# Patient Record
Sex: Female | Born: 1990 | Race: Black or African American | Hispanic: No | Marital: Single | Smoking: Former smoker
Health system: Southern US, Community
[De-identification: ages and names within clinical notes are randomized; demographics above are authoritative.]

## PROBLEM LIST (undated history)

## (undated) ENCOUNTER — Inpatient Hospital Stay: Payer: Self-pay

## (undated) ENCOUNTER — Inpatient Hospital Stay (HOSPITAL_COMMUNITY): Payer: Self-pay

## (undated) DIAGNOSIS — Z889 Allergy status to unspecified drugs, medicaments and biological substances status: Secondary | ICD-10-CM

## (undated) DIAGNOSIS — R202 Paresthesia of skin: Secondary | ICD-10-CM

## (undated) DIAGNOSIS — J45909 Unspecified asthma, uncomplicated: Secondary | ICD-10-CM

## (undated) DIAGNOSIS — R569 Unspecified convulsions: Secondary | ICD-10-CM

## (undated) DIAGNOSIS — R5383 Other fatigue: Secondary | ICD-10-CM

## (undated) DIAGNOSIS — F329 Major depressive disorder, single episode, unspecified: Secondary | ICD-10-CM

## (undated) DIAGNOSIS — F988 Other specified behavioral and emotional disorders with onset usually occurring in childhood and adolescence: Secondary | ICD-10-CM

## (undated) DIAGNOSIS — F419 Anxiety disorder, unspecified: Secondary | ICD-10-CM

## (undated) DIAGNOSIS — R200 Anesthesia of skin: Secondary | ICD-10-CM

## (undated) DIAGNOSIS — K219 Gastro-esophageal reflux disease without esophagitis: Secondary | ICD-10-CM

## (undated) DIAGNOSIS — R519 Headache, unspecified: Secondary | ICD-10-CM

## (undated) DIAGNOSIS — F32A Depression, unspecified: Secondary | ICD-10-CM

## (undated) DIAGNOSIS — J302 Other seasonal allergic rhinitis: Secondary | ICD-10-CM

## (undated) DIAGNOSIS — G8929 Other chronic pain: Secondary | ICD-10-CM

## (undated) HISTORY — DX: Other chronic pain: G89.29

## (undated) HISTORY — PX: TUBAL LIGATION: SHX77

## (undated) HISTORY — DX: Anesthesia of skin: R20.2

## (undated) HISTORY — PX: CHOLECYSTECTOMY: SHX55

## (undated) HISTORY — DX: Other specified behavioral and emotional disorders with onset usually occurring in childhood and adolescence: F98.8

## (undated) HISTORY — DX: Anxiety disorder, unspecified: F41.9

## (undated) HISTORY — PX: TONSILLECTOMY: SUR1361

## (undated) HISTORY — DX: Other fatigue: R53.83

## (undated) HISTORY — DX: Anesthesia of skin: R20.0

---
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
## 2009-11-11 ENCOUNTER — Emergency Department: Payer: Self-pay | Admitting: Emergency Medicine

## 2010-10-31 ENCOUNTER — Emergency Department: Payer: Self-pay | Admitting: Internal Medicine

## 2011-01-12 ENCOUNTER — Emergency Department: Payer: Self-pay | Admitting: Emergency Medicine

## 2011-02-26 ENCOUNTER — Encounter: Payer: Self-pay | Admitting: Sports Medicine

## 2011-02-28 ENCOUNTER — Encounter: Payer: Self-pay | Admitting: Sports Medicine

## 2011-03-30 ENCOUNTER — Encounter: Payer: Self-pay | Admitting: Sports Medicine

## 2011-09-15 ENCOUNTER — Emergency Department: Payer: Self-pay | Admitting: *Deleted

## 2011-11-05 ENCOUNTER — Other Ambulatory Visit: Payer: Self-pay | Admitting: Physician Assistant

## 2011-11-05 LAB — CBC WITH DIFFERENTIAL/PLATELET
Basophil %: 0.7 %
Eosinophil #: 0.1 10*3/uL (ref 0.0–0.7)
HCT: 39.8 % (ref 35.0–47.0)
HGB: 13.6 g/dL (ref 12.0–16.0)
Lymphocyte #: 2.2 10*3/uL (ref 1.0–3.6)
Lymphocyte %: 25.5 %
MCV: 90 fL (ref 80–100)
Neutrophil #: 5.7 10*3/uL (ref 1.4–6.5)
RBC: 4.42 10*6/uL (ref 3.80–5.20)
RDW: 13 % (ref 11.5–14.5)
WBC: 8.5 10*3/uL (ref 3.6–11.0)

## 2011-11-05 LAB — COMPREHENSIVE METABOLIC PANEL
Albumin: 3.7 g/dL (ref 3.4–5.0)
Anion Gap: 5 — ABNORMAL LOW (ref 7–16)
Calcium, Total: 8.6 mg/dL (ref 8.5–10.1)
Chloride: 104 mmol/L (ref 98–107)
Glucose: 91 mg/dL (ref 65–99)
Osmolality: 274 (ref 275–301)
Potassium: 3.8 mmol/L (ref 3.5–5.1)
Sodium: 137 mmol/L (ref 136–145)
Total Protein: 7.9 g/dL (ref 6.4–8.2)

## 2011-11-05 LAB — TSH: Thyroid Stimulating Horm: 2.53 u[IU]/mL

## 2011-11-05 LAB — HCG, QUANTITATIVE, PREGNANCY: Beta Hcg, Quant.: <1 m[IU]/mL — ABNORMAL LOW

## 2012-01-24 ENCOUNTER — Encounter (HOSPITAL_COMMUNITY): Payer: Self-pay

## 2012-01-24 ENCOUNTER — Inpatient Hospital Stay (HOSPITAL_COMMUNITY)
Admission: AD | Admit: 2012-01-24 | Discharge: 2012-01-24 | Disposition: A | Discharge status: Home / Self Care | Payer: PRIVATE HEALTH INSURANCE | Source: Ambulatory Visit | Attending: Obstetrics and Gynecology | Admitting: Obstetrics and Gynecology

## 2012-01-24 DIAGNOSIS — O99891 Other specified diseases and conditions complicating pregnancy: Secondary | ICD-10-CM | POA: Insufficient documentation

## 2012-01-24 DIAGNOSIS — J069 Acute upper respiratory infection, unspecified: Secondary | ICD-10-CM

## 2012-01-24 DIAGNOSIS — O210 Mild hyperemesis gravidarum: Secondary | ICD-10-CM | POA: Insufficient documentation

## 2012-01-24 HISTORY — DX: Other seasonal allergic rhinitis: J30.2

## 2012-01-24 HISTORY — DX: Unspecified asthma, uncomplicated: J45.909

## 2012-01-24 LAB — URINALYSIS, ROUTINE W REFLEX MICROSCOPIC
Glucose, UA: NEGATIVE mg/dL
Ketones, ur: NEGATIVE mg/dL
Leukocytes, UA: NEGATIVE
Protein, ur: NEGATIVE mg/dL

## 2012-01-24 MED ORDER — HYDROCOD POLST-CHLORPHEN POLST 10-8 MG/5ML PO LQCR: 5.0000 mL | Freq: Two times a day (BID) | ORAL | Status: DC | PRN

## 2012-01-24 MED ORDER — PROMETHAZINE HCL 25 MG/ML IJ SOLN
25.0000 mg | Freq: Once | INTRAVENOUS | Status: DC
  Filled 2012-01-24: qty 1

## 2012-01-24 MED ORDER — HYDROCOD POLST-CHLORPHEN POLST 10-8 MG/5ML PO LQCR
5.0000 mL | Freq: Once | ORAL | Status: AC
  Administered 2012-01-24: 5 mL via ORAL
  Filled 2012-01-24: qty 5

## 2012-01-24 NOTE — MAU Note (Signed)
Patient states she has had a cold and cough for about one week. With coughing she vomits. Is taking a Z pack for the URI. Has been dehydrated and unable to keep much food or fluid down. General body aches and upper abdominal pain. Denies any bleeding or vaginal discharge.

## 2012-01-24 NOTE — MAU Provider Note (Signed)
History     CSN: [id number]  Arrival date and time: 01/24/12 1043   First Provider Initiated Contact with Patient 01/24/12 1205      Chief Complaint  Patient presents with  . Emesis  . Cough   HPI This is a 21 y.o. female at [redacted]w[redacted]d who presents with c/o vomiting when she coughs.  She has had a cold for a week and was given a Z-pack for it. States it is not so much that she cannot keep food down, but that she does not feel like eating because of coughing. Denies fever.Denies pain or bleeding.   RN Note: Patient states she has had a cold and cough for about one week. With coughing she vomits. Is taking a Z pack for the URI. Has been dehydrated and unable to keep much food or fluid down. General body aches and upper abdominal pain. Denies any bleeding or vaginal discharge.       OB History    Grav Para Term Preterm Abortions TAB SAB Ect Mult Living   1 0 0 0 0 0 0 0 0 0       Past Medical History  Diagnosis Date  . Asthma   . Seasonal allergies     Past Surgical History  Procedure Date  . Tonsillectomy     Family History  Problem Relation Age of Onset  . Hypertension Mother   . Arthritis Mother   . Cancer Mother   . Diabetes Mother   . Cancer Father     History  Substance Use Topics  . Smoking status: Never Smoker   . Smokeless tobacco: Not on file  . Alcohol Use: No    Allergies:  Allergies  Allergen Reactions  . Sulfa Drugs Cross Reactors Other (See Comments)    Uncontrollable body shaking.    Prescriptions prior to admission  Medication Sig Dispense Refill  . guaiFENesin-dextromethorphan (ROBITUSSIN DM) 100-10 MG/5ML syrup Take 5 mLs by mouth 3 (three) times daily as needed. For cold symptoms.      . Prenatal Vit-Fe Fumarate-FA (PRENATAL MULTIVITAMIN) TABS Take 1 tablet by mouth daily.        ROS See HPI  Physical Exam   Blood pressure 122/71, pulse 102, temperature 98.3 F (36.8 C), temperature source Oral, resp. rate 16, height 5' 6.5" (1.689  m), weight 178 lb 6.4 oz (80.922 kg), last menstrual period 11/11/2011, SpO2 98.00%.  Physical Exam  Constitutional: She is oriented to person, place, and time. She appears well-developed and well-nourished. No distress.  Cardiovascular: Normal rate.   Respiratory: Effort normal. She has no wheezes. She has no rales.  GI: Soft. She exhibits no distension and no mass. There is no tenderness. There is no rebound and no guarding.  Musculoskeletal: Normal range of motion.  Neurological: She is alert and oriented to person, place, and time.  Skin: Skin is warm and dry.  Psychiatric: She has a normal mood and affect.   Lungs clear Results for orders placed during the hospital encounter of 01/24/12 (from the past 24 hour(s))  URINALYSIS, ROUTINE W REFLEX MICROSCOPIC     Status: Normal   Collection Time   01/24/12 11:04 AM      Component Value Range   Color, Urine YELLOW  YELLOW   APPearance CLEAR  CLEAR   Specific Gravity, Urine 1.020  1.005 - 1.030   pH 7.0  5.0 - 8.0   Glucose, UA NEGATIVE  NEGATIVE mg/dL   Hgb urine dipstick NEGATIVE  NEGATIVE  Bilirubin Urine NEGATIVE  NEGATIVE   Ketones, ur NEGATIVE  NEGATIVE mg/dL   Protein, ur NEGATIVE  NEGATIVE mg/dL   Urobilinogen, UA 0.2  0.0 - 1.0 mg/dL   Nitrite NEGATIVE  NEGATIVE   Leukocytes, UA NEGATIVE  NEGATIVE    MAU Course  Procedures  Assessment and Plan  A:  SIUP at [redacted]w[redacted]d       Upper respiratory infection      Vomiting related to above      No evidence of dehydration  P:  Consult Dr Jodell Cipro dose of Tussionex with good result, so Rx given      Recommend antihistamine      Follow up in office      PO hydration at home  [facility] 01/24/2012, 1:13 PM

## 2012-01-31 ENCOUNTER — Encounter (HOSPITAL_COMMUNITY): Payer: Self-pay | Admitting: *Deleted

## 2012-01-31 ENCOUNTER — Inpatient Hospital Stay (HOSPITAL_COMMUNITY)
Admission: AD | Admit: 2012-01-31 | Discharge: 2012-01-31 | Disposition: A | Discharge status: Home / Self Care | Payer: PRIVATE HEALTH INSURANCE | Source: Ambulatory Visit | Attending: Obstetrics and Gynecology | Admitting: Obstetrics and Gynecology

## 2012-01-31 DIAGNOSIS — O99891 Other specified diseases and conditions complicating pregnancy: Secondary | ICD-10-CM | POA: Insufficient documentation

## 2012-01-31 DIAGNOSIS — J705 Respiratory conditions due to smoke inhalation: Secondary | ICD-10-CM | POA: Insufficient documentation

## 2012-01-31 DIAGNOSIS — R079 Chest pain, unspecified: Secondary | ICD-10-CM | POA: Insufficient documentation

## 2012-01-31 DIAGNOSIS — R059 Cough, unspecified: Secondary | ICD-10-CM | POA: Insufficient documentation

## 2012-01-31 DIAGNOSIS — R05 Cough: Secondary | ICD-10-CM | POA: Insufficient documentation

## 2012-01-31 MED ORDER — ALBUTEROL SULFATE HFA 108 (90 BASE) MCG/ACT IN AERS: 2.0000 | INHALATION_SPRAY | RESPIRATORY_TRACT | Status: DC | PRN

## 2012-01-31 MED ORDER — ACETAMINOPHEN 500 MG PO TABS
1000.0000 mg | ORAL_TABLET | Freq: Once | ORAL | Status: AC
  Administered 2012-01-31: 1000 mg via ORAL
  Filled 2012-01-31: qty 2
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
## 2012-03-02 ENCOUNTER — Emergency Department: Payer: Self-pay | Admitting: Emergency Medicine

## 2012-03-02 LAB — CBC
HCT: 36 % (ref 35.0–47.0)
HGB: 12.7 g/dL (ref 12.0–16.0)
MCV: 89 fL (ref 80–100)
RBC: 4.06 10*6/uL (ref 3.80–5.20)
WBC: 8.5 10*3/uL (ref 3.6–11.0)

## 2012-03-02 LAB — URINALYSIS, COMPLETE
Bacteria: NONE SEEN
Bilirubin,UR: NEGATIVE
Glucose,UR: NEGATIVE mg/dL (ref 0–75)
Ketone: NEGATIVE
Specific Gravity: 1.006 (ref 1.003–1.030)
WBC UR: <1 /HPF (ref 0–5)

## 2012-03-02 LAB — HCG, QUANTITATIVE, PREGNANCY: Beta Hcg, Quant.: 52632 m[IU]/mL — ABNORMAL HIGH

## 2012-06-11 ENCOUNTER — Observation Stay: Payer: Self-pay | Admitting: Obstetrics and Gynecology

## 2012-07-24 ENCOUNTER — Observation Stay: Payer: Self-pay | Admitting: Obstetrics and Gynecology

## 2012-07-24 LAB — URINALYSIS, COMPLETE
Bilirubin,UR: NEGATIVE
Nitrite: NEGATIVE
RBC,UR: 1 /HPF (ref 0–5)
Specific Gravity: 1.011 (ref 1.003–1.030)

## 2012-08-06 ENCOUNTER — Observation Stay: Payer: Self-pay | Admitting: Obstetrics and Gynecology

## 2012-08-18 ENCOUNTER — Inpatient Hospital Stay: Payer: Self-pay | Admitting: Obstetrics and Gynecology

## 2012-08-18 LAB — CBC WITH DIFFERENTIAL/PLATELET
Basophil #: 0 10*3/uL (ref 0.0–0.1)
Basophil %: 0.2 %
HCT: 37 % (ref 35.0–47.0)
MCH: 30.1 pg (ref 26.0–34.0)
Monocyte #: 1 x10 3/mm — ABNORMAL HIGH (ref 0.2–0.9)
Platelet: 252 10*3/uL (ref 150–440)
RBC: 4.1 10*6/uL (ref 3.80–5.20)
RDW: 14.9 % — ABNORMAL HIGH (ref 11.5–14.5)
WBC: 11.2 10*3/uL — ABNORMAL HIGH (ref 3.6–11.0)

## 2012-08-18 LAB — COMPREHENSIVE METABOLIC PANEL
Alkaline Phosphatase: 161 U/L — ABNORMAL HIGH (ref 50–136)
Calcium, Total: 8.3 mg/dL — ABNORMAL LOW (ref 8.5–10.1)
EGFR (Non-African Amer.): >60
Glucose: 95 mg/dL (ref 65–99)
SGPT (ALT): 20 U/L (ref 12–78)

## 2012-08-19 LAB — LIPASE, BLOOD: Lipase: 85 U/L (ref 73–393)

## 2012-08-20 LAB — HEMATOCRIT: HCT: 36.3 % (ref 35.0–47.0)

## 2014-02-28 ENCOUNTER — Encounter (HOSPITAL_COMMUNITY): Payer: Self-pay | Admitting: *Deleted

## 2014-05-16 DIAGNOSIS — F988 Other specified behavioral and emotional disorders with onset usually occurring in childhood and adolescence: Secondary | ICD-10-CM | POA: Insufficient documentation

## 2014-08-19 NOTE — Op Note (Signed)
PATIENT NAME:  Erin [name], Erin [name] MR#:  [id number] DATE OF BIRTH:  21-Apr-1991  DATE OF PROCEDURE:  08/19/2012  PREOPERATIVE DIAGNOSES: 1.  Arrest of descent.  2.  Cephalopelvic disproportion.   POSTOPERATIVE DIAGNOSES:  1.  Arrest of descent.  2.  Cephalopelvic disproportion.   PROCEDURE:  1.  Primary low transverse cesarean section.  2.  On-Q pump placement.   ANESTHESIA: Surgical dosing of epidural.   SURGEON: Jennell Cornerhomas Bennie Chirico, MD   FIRST ASSISTANT: Acquanetta BellingAngela Lugiano, CNM   INDICATIONS: This is a 24 year old gravida 2, para 0 patient admitted the night of April 22nd for induction due to biliary colic and at 40 + 1 weeks.  The patient labored throughout the day on 08/19/2012, progressed to complete, and that did not bring the fetus down past +0 station after 2 hours of pushing. The fetus did demonstrate positive caput.   DESCRIPTION OF PROCEDURE: After adequate surgical dosing of continuous lumbar epidural, the patient was prepped and draped in normal sterile fashion. She received 2 grams IV Ancef. A Foley catheter was previously placed. A Pfannenstiel incision was made 2 fingerbreadths above the symphysis pubis. Sharp dissection was used to identify the fascia. The fascia was opened in the midline and opened in a transverse fashion. The superior aspect of the fascia was grasped with Kocher clamps and the recti muscles dissected free. The inferior aspect of the fascia was grasped with Kocher clamps and the pyramidalis muscles dissected free. The peritoneal cavity was opened rather sharply. A bladder blade was placed while the vesicouterine peritoneal fold was identified and opened. A flap was created and the bladder was reflected inferiorly. A low transverse uterine incision was made. Upon entry into the endometrial cavity, clear fluid resulted. An extremely wedged fetal head was noted.  Operative nurse standing by aided with the vaginal hand which allowed for elevation of the caput.  Ultimately, the head was brought up to the uterine incision. Vacuum was applied to the occiput 4 separate times with a gentle traction to help deliver the head. The vacuum popped off every time; ultimately on the last pull, and after opening up the fascia, the head delivered followed by the body, a floppy infant female. The cord was doubly clamped and passed to the nursery staff who assigned Apgar scores of 8 and 9. The placenta was manually delivered. The uterus was exteriorized. The endometrial cavity was wiped clean with a laparotomy tape. The uterine incision was closed with 1 chromic suture in a running locking fashion. One additional figure-of-eight suture was required for good hemostasis. The fallopian tubes and ovaries appeared normal. The posterior cul-de-sac was irrigated and suctioned, and the uterus was placed back into the abdominal cavity. Paracolic gutters were wiped clean with laparotomy tape, and the uterine incision again appeared hemostatic. Interceed was placed over the uterine incision in a T-shaped fashion. The superior aspect of the fascia was grasped with Kocher clamps, and the On-Q pump needles were placed subfascially from an infraumbilical insertion site. The fascia was then closed over top of these 2 catheters with 0 Vicryl suture in a running nonlocking fashion with good approximation of edges. Subcutaneous tissues were then irrigated and bovied for hemostasis. The skin was reapproximated with staples. The On-Q pump catheters were secured at the skin level with Dermabond and Steri-Strips and Tegaderm then placed over this. Each catheter was loaded with 5 mL of 0.5% Marcaine. There were no complications. Estimated blood was 900 mL.  Intraoperative fluids were 900 mL.  The  patient was taken to the recovery room in good condition.   ____________________________ Suzy Bouchard, MD tjs:cb [name]: 08/19/2012 23:14:55 ET T: 08/19/2012 23:26:27 ET JOB#: [id number]  cc: Suzy Bouchard, MD, <Dictator> Suzy Bouchard MD ELECTRONICALLY SIGNED 08/21/2012 9:49

## 2014-09-06 NOTE — H&P (Signed)
L&D Evaluation:  History:  HPI 29 y/on BF at 36 weeks  Presents with N/V of 12 hrs duration Denies diarrhea, fever, dysuria   Presents with contractions, nausea/vomiting   Medications Pre Natal Vitamins   Allergies NKDA   Family History Non-Contributory   ROS:  ROS All systems were reviewed.  HEENT, CNS, GI, GU, Respiratory, CV, Renal and Musculoskeletal systems were found to be normal.   Exam:  General no apparent distress   Abdomen gravid, non-tender   Back +/- CVAT at right   Edema 1+   Mebranes Intact   FHT normal rate with no decels   Ucx irregular   Impression:  Impression N/V with back pain   Plan:  Comments Feels better after IV LR and phenergan Check Ua -- remote H/O UTI; no Hx of stones Trial of saltines and sips   Electronic Signatures: Margaretha GlassingEvans, Ricky L (MD)  (Signed 28-Mar-14 07:32)  Authored: L&D Evaluation   Last Updated: 28-Mar-14 07:32 by Margaretha GlassingEvans, Ricky L (MD)

## 2014-09-06 NOTE — H&P (Signed)
L&D Evaluation:  History:  HPI 24yo G2P0010 with LMP of 11/11/11 & EDD of 08/17/12 [facility] significant for Low lying placenta (resolved) 04/02/12, pt transferred from [facility], Asthma, Tdap given at 27 weeks, GC tx at 36 wks with no confirmed GC result for TOC on chart.   Presents with contractions, +constipation   Patient's Medical History Asthma  +GC   Patient's Surgical History Chronic Back pain,   Medications Pre Natal Vitamins   Allergies Sulfa, Sulfa causes hives   Social History none   Family History Non-Contributory   ROS:  ROS All systems were reviewed.  HEENT, CNS, GI, GU, Respiratory, CV, Renal and Musculoskeletal systems were found to be normal.   Exam:  Vital Signs stable   General no apparent distress   Mental Status clear   Chest clear   Heart normal sinus rhythm, no murmur/gallop/rubs   Abdomen gravid, non-tender   Estimated Fetal Weight Average for gestational age   Fetal Position vtx   Back no CVAT   Edema 1+   Reflexes 1+   Clonus negative   Pelvic 3/80/vtx-1   Mebranes Intact   FHT normal rate with no decels   Ucx irregular, absent   Skin dry   Lymph no lymphadenopathy   Impression:  Impression active labor   Plan:  Plan monitor contractions and for cervical change, Ambulate to see if UC's continue   Electronic Signatures: Sharee PimpleJones, Eniya Cannady W (CNM)  (Signed 10-Apr-14 09:03)  Authored: L&D Evaluation   Last Updated: 10-Apr-14 09:03 by Sharee PimpleJones, Dilraj Killgore W (CNM)

## 2014-09-06 NOTE — H&P (Signed)
L&D Evaluation:  History:  HPI 24 [facility] G2P0010 [facility] due to [facility] colic. [facility]. HX Asthma, +GC at 36wks RX'd negative toc.   Medications Pre [place]atal Vitamins  Prilosec    Proair QD   Allergies Sulfa   Social History none   Family History Non-Contributory   ROS:  ROS All systems were reviewed.  HEENT, CNS, GI, GU, Respiratory, CV, Renal and Musculoskeletal systems were found to be normal.   Exam:  Vital Signs stable   Urine Protein to lab   General no apparent distress   Mental Status clear   Chest clear   Heart normal sinus rhythm   Abdomen gravid, non-tender   Estimated Fetal Weight Average for gestational age   Fetal Position vtx   Fundal Height term   Back no CVAT   Edema 1+   Reflexes 2+   Clonus negative   Pelvic no external lesions, 5cm 100% vtx @ -1 BOWI nl show   Mebranes Intact   FHT normal rate with no decels, baseline 120's 130's avg variability with accels   Fetal Heart Rate 132   Ucx irregular, Q 2/604mins 60 sec moderate   Skin dry   Lymph no lymphadenopathy   Impression:  Impression active labor   Plan:  Comments Cervidil removed during the night, C/O increasing discomfort this am, requests epidural, anesthesia notified. Explained plan of care, what to expect with first baby. Partner at bedside, supportive.   Electronic Signatures: Albertina ParrLugiano, Shawnte Demarest B (CNM)  (Signed 23-Apr-14 08:40)  Authored: L&D Evaluation   Last Updated: 23-Apr-14 08:40 by Albertina ParrLugiano, Denzil Mceachron B (CNM)

## 2015-02-11 ENCOUNTER — Telehealth: Payer: Self-pay | Admitting: Obstetrics and Gynecology

## 2015-02-11 NOTE — Telephone Encounter (Signed)
Pt called in for lower back pain and wants muscle relaxer called in at 8 weeks pregnancy. Will call Flexeril 10 mg po TID to [facility] [address]

## 2015-02-13 ENCOUNTER — Other Ambulatory Visit: Payer: Self-pay | Admitting: Obstetrics and Gynecology

## 2015-02-14 DIAGNOSIS — M797 Fibromyalgia: Secondary | ICD-10-CM | POA: Insufficient documentation

## 2015-03-01 DIAGNOSIS — Z98891 History of uterine scar from previous surgery: Secondary | ICD-10-CM | POA: Insufficient documentation

## 2015-03-01 LAB — OB RESULTS CONSOLE HIV ANTIBODY (ROUTINE TESTING): HIV: NONREACTIVE

## 2015-03-01 LAB — OB RESULTS CONSOLE RUBELLA ANTIBODY, IGM: Rubella: IMMUNE

## 2015-03-01 LAB — OB RESULTS CONSOLE RPR: RPR: NONREACTIVE

## 2015-03-01 LAB — OB RESULTS CONSOLE VARICELLA ZOSTER ANTIBODY, IGG: VARICELLA IGG: IMMUNE

## 2015-03-01 LAB — OB RESULTS CONSOLE GC/CHLAMYDIA
Chlamydia: NEGATIVE
Gonorrhea: NEGATIVE

## 2015-03-01 LAB — OB RESULTS CONSOLE HEPATITIS B SURFACE ANTIGEN: Hepatitis B Surface Ag: NEGATIVE

## 2015-03-02 ENCOUNTER — Other Ambulatory Visit: Payer: Self-pay | Admitting: Obstetrics and Gynecology

## 2015-03-02 DIAGNOSIS — Z369 Encounter for antenatal screening, unspecified: Secondary | ICD-10-CM

## 2015-03-07 ENCOUNTER — Emergency Department
Admission: EM | Admit: 2015-03-07 | Discharge: 2015-03-07 | Disposition: A | Discharge status: Home / Self Care | Payer: Medicaid Other | Attending: Emergency Medicine | Admitting: Emergency Medicine

## 2015-03-07 ENCOUNTER — Encounter: Payer: Self-pay | Admitting: Emergency Medicine

## 2015-03-07 ENCOUNTER — Emergency Department: Payer: Medicaid Other

## 2015-03-07 DIAGNOSIS — O210 Mild hyperemesis gravidarum: Secondary | ICD-10-CM | POA: Insufficient documentation

## 2015-03-07 DIAGNOSIS — O2331 Infections of other parts of urinary tract in pregnancy, first trimester: Secondary | ICD-10-CM | POA: Insufficient documentation

## 2015-03-07 DIAGNOSIS — J069 Acute upper respiratory infection, unspecified: Secondary | ICD-10-CM | POA: Diagnosis not present

## 2015-03-07 DIAGNOSIS — N390 Urinary tract infection, site not specified: Secondary | ICD-10-CM

## 2015-03-07 DIAGNOSIS — Z79899 Other long term (current) drug therapy: Secondary | ICD-10-CM | POA: Insufficient documentation

## 2015-03-07 DIAGNOSIS — O99511 Diseases of the respiratory system complicating pregnancy, first trimester: Secondary | ICD-10-CM | POA: Insufficient documentation

## 2015-03-07 DIAGNOSIS — J45901 Unspecified asthma with (acute) exacerbation: Secondary | ICD-10-CM | POA: Insufficient documentation

## 2015-03-07 DIAGNOSIS — Z3A11 11 weeks gestation of pregnancy: Secondary | ICD-10-CM | POA: Insufficient documentation

## 2015-03-07 LAB — URINALYSIS COMPLETE WITH MICROSCOPIC (ARMC ONLY)
BILIRUBIN URINE: NEGATIVE
Bacteria, UA: NONE SEEN
Glucose, UA: NEGATIVE mg/dL
Hgb urine dipstick: NEGATIVE
NITRITE: NEGATIVE
PROTEIN: 30 mg/dL — AB
Specific Gravity, Urine: 1.026 (ref 1.005–1.030)
Squamous Epithelial / LPF: NONE SEEN
pH: 8 (ref 5.0–8.0)

## 2015-03-07 LAB — COMPREHENSIVE METABOLIC PANEL
ALT: 13 U/L — AB (ref 14–54)
AST: 15 U/L (ref 15–41)
Albumin: 3.2 g/dL — ABNORMAL LOW (ref 3.5–5.0)
Alkaline Phosphatase: 53 U/L (ref 38–126)
Anion gap: 6 (ref 5–15)
BILIRUBIN TOTAL: 0.4 mg/dL (ref 0.3–1.2)
BUN: 10 mg/dL (ref 6–20)
CO2: 24 mmol/L (ref 22–32)
CREATININE: 0.43 mg/dL — AB (ref 0.44–1.00)
Calcium: 8.7 mg/dL — ABNORMAL LOW (ref 8.9–10.3)
Chloride: 108 mmol/L (ref 101–111)
GFR calc Af Amer: >60 mL/min (ref >60)
Glucose, Bld: 116 mg/dL — ABNORMAL HIGH (ref 65–99)
POTASSIUM: 3.7 mmol/L (ref 3.5–5.1)
Sodium: 138 mmol/L (ref 135–145)
TOTAL PROTEIN: 6.7 g/dL (ref 6.5–8.1)

## 2015-03-07 LAB — HCG, QUANTITATIVE, PREGNANCY: hCG, Beta Chain, Quant, S: 107541 m[IU]/mL — ABNORMAL HIGH (ref <5)

## 2015-03-07 LAB — CBC
HCT: 37.1 % (ref 35.0–47.0)
Hemoglobin: 12.5 g/dL (ref 12.0–16.0)
MCH: 29.7 pg (ref 26.0–34.0)
MCHC: 33.7 g/dL (ref 32.0–36.0)
MCV: 88.3 fL (ref 80.0–100.0)
PLATELETS: 271 10*3/uL (ref 150–440)
RBC: 4.2 MIL/uL (ref 3.80–5.20)
RDW: 13.8 % (ref 11.5–14.5)
WBC: 7.6 10*3/uL (ref 3.6–11.0)

## 2015-03-07 LAB — LIPASE, BLOOD: Lipase: 25 U/L (ref 11–51)

## 2015-03-07 MED ORDER — PYRIDOXINE HCL 25 MG PO TABS: 25.0000 mg | ORAL_TABLET | Freq: Three times a day (TID) | ORAL | Status: DC | PRN

## 2015-03-07 MED ORDER — ONDANSETRON HCL 4 MG/2ML IJ SOLN
4.0000 mg | Freq: Once | INTRAMUSCULAR | Status: AC
  Administered 2015-03-07: 4 mg via INTRAVENOUS

## 2015-03-07 MED ORDER — SODIUM CHLORIDE 0.9 % IV BOLUS (SEPSIS)
1000.0000 mL | Freq: Once | INTRAVENOUS | Status: AC
  Administered 2015-03-07: 1000 mL via INTRAVENOUS

## 2015-03-07 MED ORDER — ONDANSETRON HCL 4 MG/2ML IJ SOLN
INTRAMUSCULAR | Status: AC
  Administered 2015-03-07: 4 mg via INTRAVENOUS
  Filled 2015-03-07: qty 2

## 2015-03-07 MED ORDER — IPRATROPIUM-ALBUTEROL 0.5-2.5 (3) MG/3ML IN SOLN
3.0000 mL | Freq: Once | RESPIRATORY_TRACT | Status: AC
  Administered 2015-03-07: 3 mL via RESPIRATORY_TRACT
  Filled 2015-03-07: qty 3

## 2015-03-07 MED ORDER — ACETAMINOPHEN 325 MG PO TABS
ORAL_TABLET | ORAL | Status: AC
  Administered 2015-03-07: 650 mg via ORAL
  Filled 2015-03-07: qty 2

## 2015-03-07 MED ORDER — ACETAMINOPHEN 325 MG PO TABS
650.0000 mg | ORAL_TABLET | Freq: Once | ORAL | Status: AC
  Administered 2015-03-07: 650 mg via ORAL

## 2015-03-07 MED ORDER — IPRATROPIUM-ALBUTEROL 0.5-2.5 (3) MG/3ML IN SOLN
RESPIRATORY_TRACT | Status: AC
  Administered 2015-03-07: 3 mL via RESPIRATORY_TRACT
  Filled 2015-03-07: qty 3

## 2015-03-07 MED ORDER — PYRIDOXINE HCL 25 MG PO TABS
25.0000 mg | ORAL_TABLET | Freq: Every day | ORAL | Status: DC
  Administered 2015-03-07: 25 mg via ORAL
  Filled 2015-03-07: qty 1

## 2015-03-07 MED ORDER — NITROFURANTOIN MONOHYD MACRO 100 MG PO CAPS: 100.0000 mg | ORAL_CAPSULE | Freq: Two times a day (BID) | ORAL | Status: DC

## 2015-03-07 MED ORDER — NITROFURANTOIN MONOHYD MACRO 100 MG PO CAPS
100.0000 mg | ORAL_CAPSULE | Freq: Once | ORAL | Status: AC
  Administered 2015-03-07: 100 mg via ORAL
  Filled 2015-03-07: qty 1

## 2015-03-07 MED ORDER — NITROFURANTOIN MONOHYD MACRO 100 MG PO CAPS
ORAL_CAPSULE | ORAL | Status: AC
  Administered 2015-03-07: 100 mg via ORAL
  Filled 2015-03-07: qty 1

## 2015-03-07 MED ORDER — PREDNISONE 20 MG PO TABS
40.0000 mg | ORAL_TABLET | Freq: Once | ORAL | Status: AC
  Administered 2015-03-07: 40 mg via ORAL

## 2015-03-07 MED ORDER — ALBUTEROL SULFATE HFA 108 (90 BASE) MCG/ACT IN AERS: 2.0000 | INHALATION_SPRAY | Freq: Four times a day (QID) | RESPIRATORY_TRACT | Status: DC | PRN

## 2015-03-07 MED ORDER — PREDNISONE 20 MG PO TABS
ORAL_TABLET | ORAL | Status: AC
  Administered 2015-03-07: 40 mg via ORAL
  Filled 2015-03-07: qty 2

## 2015-03-07 MED ORDER — PREDNISONE 20 MG PO TABS: 40.0000 mg | ORAL_TABLET | Freq: Once | ORAL | Status: DC

## 2015-03-07 NOTE — ED Provider Notes (Signed)
[facility] Emergency Department Provider Note  ____________________________________________  Time seen: Approximately 4:39 PM  I have reviewed the triage vital signs and the nursing notes.   HISTORY  Chief Complaint Emesis and Cough    HPI Erin [name] is a 24 y.o. female G2 P1 approximately [redacted] weeks pregnant presenting with persistent cough and nausea and vomiting. Patient states that she has had hyperemesis throughout her pregnancy and is currently being treated with 12.5 mg of Phenergan which she is unable to keep down. She has had some improvement in her vomiting with ODT Zofran. She denies any lightheadedness or syncope, no fever. No abdominal pain or vaginal bleeding, no gush of fluid.  She is also currently under treatment for bacterial vaginosis and yeast infection, with improving symptoms. For the past week the patient has had a persistent, and at times violent cough often resulting in posttussive vomiting. She has been treated with albuterol MDI and DuoNeb with no improvement, and was started on a Z-Pak 4 days ago but has been unable to keep down her medications due to vomiting. She denies any fever but has had cold chills. She denies any shortness of breath or chest pain. She has not yet had any radiographic imaging of her lungs. The patient does have a remote history of asthma but states she has not had a flare since her last pregnancy several years ago.   Past Medical History  Diagnosis Date  . Asthma   . Seasonal allergies     There are no active problems to display for this patient.   Past Surgical History  Procedure Laterality Date  . Tonsillectomy      Current Outpatient Rx  Name  Route  Sig  Dispense  Refill  . albuterol (PROVENTIL HFA;VENTOLIN HFA) 108 (90 BASE) MCG/ACT inhaler   Inhalation   Inhale 2 puffs into the lungs every 6 (six) hours as needed for wheezing or shortness of breath.   1 Inhaler   0   . calcium carbonate  (TUMS - DOSED IN MG ELEMENTAL CALCIUM) 500 MG chewable tablet   Oral   Chew 1 tablet by mouth daily.         . cyclobenzaprine (FLEXERIL) 10 MG tablet   Oral   Take 10 mg by mouth 3 (three) times daily as needed for muscle spasms (Dispense 30, 2 refills).         . nitrofurantoin, macrocrystal-monohydrate, (MACROBID) 100 MG capsule   Oral   Take 1 capsule (100 mg total) by mouth 2 (two) times daily.   10 capsule   0   . predniSONE (DELTASONE) 20 MG tablet   Oral   Take 2 tablets (40 mg total) by mouth once.   8 tablet   0   . Prenatal Vit-Fe Fumarate-FA (PRENATAL MULTIVITAMIN) TABS   Oral   Take 1 tablet by mouth daily.         Marland Kitchen. pyridOXINE (VITAMIN B-6) 25 MG tablet   Oral   Take 1 tablet (25 mg total) by mouth every 8 (eight) hours as needed.   30 tablet   0     Allergies Sulfa drugs cross reactors  Family History  Problem Relation Age of Onset  . Hypertension Mother   . Arthritis Mother   . Cancer Mother   . Diabetes Mother   . Cancer Father     Social History Social History  Substance Use Topics  . Smoking status: Never Smoker   . Smokeless  tobacco: None  . Alcohol Use: No    Review of Systems Constitutional: No fever, positive chills. No lightheadedness or syncope. Eyes: No visual changes. ENT: No sore throat. Cardiovascular: Denies chest pain, palpitations. Respiratory: Denies shortness of breath.  Positive nonproductive cough. Positive posttussive emesis Gastrointestinal: No abdominal pain.  Positive nausea, positive vomiting.  No diarrhea.  No constipation. Genitourinary: Negative for dysuria. No vaginal bleeding or gush of fluid. No change in vaginal discharge Musculoskeletal: Negative for back pain. Skin: Negative for rash. Neurological: Negative for headaches, focal weakness or numbness.  10-point ROS otherwise negative.  ____________________________________________   PHYSICAL EXAM:  VITAL SIGNS: ED Triage Vitals  Enc Vitals  Group     BP 03/07/15 1454 132/83 mmHg     Pulse Rate 03/07/15 1454 112     Resp 03/07/15 1454 18     Temp 03/07/15 1454 98.3 F (36.8 C)     Temp Source 03/07/15 1454 Oral     SpO2 03/07/15 1454 97 %     Weight 03/07/15 1454 193 lb (87.544 kg)     Height 03/07/15 1454  (1.753 m)     Head Cir --      Peak Flow --      Pain Score 03/07/15 1454 7     Pain Loc --      Pain Edu? --      Excl. in GC? --     Constitutional: Alert and oriented. Well appearing and in no acute distress. Answer question appropriately. Eyes: Conjunctivae are normal.  EOMI. no discharge Head: Atraumatic. Nose: Positive mild congestion, no rhinorrhea. Mouth/Throat: Mucous membranes are moist.  Neck: No stridor.  Supple.  No JVD. Cardiovascular: Normal rate, regular rhythm. No murmurs, rubs or gallops.  Respiratory: Normal respiratory effort.  No retractions. Lungs CTAB.  No wheezes, rales or ronchi. Active episodes of forceful coughing. Able to speak in full sentences. Gastrointestinal: Soft and nontender. No distention. No peritoneal signs. Musculoskeletal: No LE edema.  Neurologic:  Normal speech and language. No gross focal neurologic deficits are appreciated.  Skin:  Skin is warm, dry and intact. No rash noted. Psychiatric: Mood and affect are normal. Speech and behavior are normal.  Normal judgement.  ____________________________________________   LABS (all labs ordered are listed, but only abnormal results are displayed)  Labs Reviewed  COMPREHENSIVE METABOLIC PANEL - Abnormal; Notable for the following:    Glucose, Bld 116 (*)    Creatinine, Ser 0.43 (*)    Calcium 8.7 (*)    Albumin 3.2 (*)    ALT 13 (*)    All other components within normal limits  URINALYSIS COMPLETEWITH MICROSCOPIC ([facility] ONLY) - Abnormal; Notable for the following:    Color, Urine YELLOW (*)    APPearance TURBID (*)    Ketones, ur TRACE (*)    Protein, ur 30 (*)    Leukocytes, UA TRACE (*)    All other  components within normal limits  HCG, QUANTITATIVE, PREGNANCY - Abnormal; Notable for the following:    hCG, Beta Chain, Quant, [place] 161096 (*)    All other components within normal limits  BORDETELLA PERTUSSIS PCR  LIPASE, BLOOD  CBC   ____________________________________________  EKG  **Not indicated* ____________________________________________  RADIOLOGY  Dg Chest 2 View  03/07/2015  CLINICAL DATA:  Several weeks of vomiting, the patient is [redacted] weeks pregnant. One week of productive cough. History of asthma. EXAM: CHEST  2 VIEW COMPARISON:  Thoracic spine series of November 12, 2009 FINDINGS:  The lungs are mildly hyperinflated without hemidiaphragm flattening. There is no interstitial or alveolar infiltrate. The heart and pulmonary vascularity are normal. The mediastinum is normal in width. There is no pleural effusion. The bony thorax is unremarkable. IMPRESSION: Mild hyperinflation consistent with known reactive airway disease. There is no evidence of pneumonia nor other acute cardiopulmonary abnormality Electronically Signed   By: David  [place] M.D.   On: 03/07/2015 17:06    ____________________________________________   PROCEDURES  Procedure(s) performed: None  Critical Care performed: No ____________________________________________   INITIAL IMPRESSION / ASSESSMENT AND PLAN / ED COURSE  Pertinent labs & imaging results that were available during my care of the patient were reviewed by me and considered in my medical decision making (see chart for details).  24 y.o. female G2 P1 approximately [redacted] weeks pregnant presenting with hyperemesis and persistent cough. The patient has had better response to Zofran in the past for her hyperemesis, so will initiate that here in the IV and consider ODT tablets to go home with. I will also start B6 for nausea in pregnancy. Plan for 1-2 L of IV fluid for rehydration although clinically the patient is not significantly dehydrated. In terms of  the patient's cough, it is likely a viral URI that is exacerbating her asthma. However given the length of the cough, I will evaluate her for pneumonia with a chest x-ray. The patient and I had a discussion about the risks and benefits of chest x-ray radiation exposure to the fetus and she agrees to proceed with the plan. The patient works as a Copy with elderly patients, and does not know when her last pertussis vaccination was. She is having forceful cough with posttussive emesis, and while this diagnosis is unlikely, I will check her for that as well.  ----------------------------------------- 5:21 PM on 03/07/2015 ----------------------------------------- Patient is feeling better and nausea has significantly improved. The patient states that she is hungry. Her coughing has also decreased in frequency. The patient's chest x-ray is consistent with hyperinflation from her asthma, but does not show pneumonia. I will proceed to treat her with prednisone which is considered a class C medication there is a small increase in cleft pallet if administered especially before 9 weeks, and the patient is aware of this. However, given her persistent cough, the benefit of the medication outweighs the risk at this time and the patient is in agreement with proceeding with this medicine. Since she does not have fever, shortness of breath, or pneumonia on chest x-ray, I have asked the patient to stop taking her Z-Pak. I will send her home with vitamin B6 and Zofran ODT for her nausea. She will follow up with her OB/GYN. ____________________________________________  FINAL CLINICAL IMPRESSION(S) / ED DIAGNOSES  Final diagnoses:  UTI (lower urinary tract infection)  Hyperemesis arising during pregnancy  URI (upper respiratory infection)  Asthma exacerbation      NEW MEDICATIONS STARTED DURING THIS VISIT:  Discharge Medication List as of 03/07/2015  5:29 PM    START taking these medications   Details   nitrofurantoin, macrocrystal-monohydrate, (MACROBID) 100 MG capsule Take 1 capsule (100 mg total) by mouth 2 (two) times daily., Starting 03/07/2015, Until Discontinued, Print    predniSONE (DELTASONE) 20 MG tablet Take 2 tablets (40 mg total) by mouth once., Starting 03/07/2015, Print    pyridOXINE (VITAMIN B-6) 25 MG tablet Take 1 tablet (25 mg total) by mouth every 8 (eight) hours as needed., Starting 03/07/2015, Until Discontinued, Print  Rockne Menghini, MD 03/07/15 2023

## 2015-03-07 NOTE — ED Notes (Signed)
Pt states she is [redacted] weeks pregnant and has been vomiting throughout pregnancy but now she is currently taking an antibiotic for productive cough but unable to keep medications down due to vomiting getting worse

## 2015-03-07 NOTE — Discharge Instructions (Signed)
For your nausea and vomiting, you may take vitamin B6 and Zofran. Please take a clear liquid diet for the next 12-24 hours, then advance to full liquid diet for 12-24 hours. After that, advance to a bland BRAT diet as tolerated. Drink plenty of fluid to stay well hydrated.  For your urinary tract infection, please take the entire course of Macrobid, even if you're feeling better.  For your cough, you may stop the Z-Pak. Please take 4 more days of prednisone to decrease inflammation from your asthma. You may continue to take albuterol for coughing spasms. Please have your OB/GYN follow up the results of your pertussis screening test from the emergency department. Next  Please return to the emergency department if you develop severe pain, vaginal bleeding, fever, shortness of breath, lightheadedness or fainting, inability to keep down fluids, or any other symptoms concerning to you.

## 2015-03-08 LAB — BORDETELLA PERTUSSIS PCR
B PARAPERTUSSIS, DNA: NEGATIVE
B pertussis, DNA: NEGATIVE

## 2015-03-10 ENCOUNTER — Observation Stay: Payer: Medicaid Other

## 2015-03-10 ENCOUNTER — Observation Stay
Admission: AD | Admit: 2015-03-10 | Discharge: 2015-03-12 | Disposition: A | Discharge status: Home / Self Care | Payer: Medicaid Other | Source: Ambulatory Visit | Attending: Obstetrics and Gynecology | Admitting: Obstetrics and Gynecology

## 2015-03-10 DIAGNOSIS — Z3A12 12 weeks gestation of pregnancy: Secondary | ICD-10-CM | POA: Diagnosis not present

## 2015-03-10 DIAGNOSIS — R05 Cough: Secondary | ICD-10-CM | POA: Diagnosis not present

## 2015-03-10 DIAGNOSIS — J069 Acute upper respiratory infection, unspecified: Secondary | ICD-10-CM

## 2015-03-10 DIAGNOSIS — E860 Dehydration: Secondary | ICD-10-CM | POA: Insufficient documentation

## 2015-03-10 DIAGNOSIS — O210 Mild hyperemesis gravidarum: Secondary | ICD-10-CM | POA: Diagnosis not present

## 2015-03-10 DIAGNOSIS — J988 Other specified respiratory disorders: Secondary | ICD-10-CM

## 2015-03-10 DIAGNOSIS — B9789 Other viral agents as the cause of diseases classified elsewhere: Secondary | ICD-10-CM

## 2015-03-10 DIAGNOSIS — R059 Cough, unspecified: Secondary | ICD-10-CM | POA: Diagnosis present

## 2015-03-10 DIAGNOSIS — J45909 Unspecified asthma, uncomplicated: Secondary | ICD-10-CM | POA: Diagnosis not present

## 2015-03-10 DIAGNOSIS — R112 Nausea with vomiting, unspecified: Secondary | ICD-10-CM | POA: Diagnosis present

## 2015-03-10 HISTORY — DX: Mild hyperemesis gravidarum: O21.0

## 2015-03-10 HISTORY — DX: Other viral agents as the cause of diseases classified elsewhere: B97.89

## 2015-03-10 LAB — COMPREHENSIVE METABOLIC PANEL
ALT: 15 U/L (ref 14–54)
AST: 17 U/L (ref 15–41)
Albumin: 3.3 g/dL — ABNORMAL LOW (ref 3.5–5.0)
Alkaline Phosphatase: 44 U/L (ref 38–126)
Anion gap: 4 — ABNORMAL LOW (ref 5–15)
BUN: 8 mg/dL (ref 6–20)
CO2: 25 mmol/L (ref 22–32)
Calcium: 8.3 mg/dL — ABNORMAL LOW (ref 8.9–10.3)
Chloride: 105 mmol/L (ref 101–111)
Creatinine, Ser: 0.51 mg/dL (ref 0.44–1.00)
GFR calc Af Amer: >60 mL/min (ref >60)
GFR calc non Af Amer: >60 mL/min (ref >60)
Glucose, Bld: 106 mg/dL — ABNORMAL HIGH (ref 65–99)
Potassium: 3.4 mmol/L — ABNORMAL LOW (ref 3.5–5.1)
Sodium: 134 mmol/L — ABNORMAL LOW (ref 135–145)
Total Bilirubin: 0.3 mg/dL (ref 0.3–1.2)
Total Protein: 6.7 g/dL (ref 6.5–8.1)

## 2015-03-10 LAB — URINALYSIS COMPLETE WITH MICROSCOPIC (ARMC ONLY)
Bacteria, UA: NONE SEEN
Bilirubin Urine: NEGATIVE
Glucose, UA: NEGATIVE mg/dL
Hgb urine dipstick: NEGATIVE
Ketones, ur: NEGATIVE mg/dL
Leukocytes, UA: NEGATIVE
Nitrite: NEGATIVE
Protein, ur: NEGATIVE mg/dL
RBC / HPF: NONE SEEN RBC/hpf (ref 0–5)
Specific Gravity, Urine: 1.016 (ref 1.005–1.030)
Squamous Epithelial / LPF: NONE SEEN
pH: 6 (ref 5.0–8.0)

## 2015-03-10 LAB — CBC WITH DIFFERENTIAL/PLATELET
BASOS ABS: 0.1 10*3/uL (ref 0–0.1)
BASOS PCT: 1 %
Eosinophils Absolute: 0.1 10*3/uL (ref 0–0.7)
Eosinophils Relative: 1 %
HEMATOCRIT: 38 % (ref 35.0–47.0)
HEMOGLOBIN: 13 g/dL (ref 12.0–16.0)
Lymphocytes Relative: 22 %
Lymphs Abs: 2.2 10*3/uL (ref 1.0–3.6)
MCH: 29.9 pg (ref 26.0–34.0)
MCHC: 34.1 g/dL (ref 32.0–36.0)
MCV: 87.8 fL (ref 80.0–100.0)
Monocytes Absolute: 0.6 10*3/uL (ref 0.2–0.9)
Monocytes Relative: 6 %
NEUTROS ABS: 7.1 10*3/uL — AB (ref 1.4–6.5)
NEUTROS PCT: 70 %
Platelets: 250 10*3/uL (ref 150–440)
RBC: 4.33 MIL/uL (ref 3.80–5.20)
RDW: 14.2 % (ref 11.5–14.5)
WBC: 10 10*3/uL (ref 3.6–11.0)

## 2015-03-10 MED ORDER — DEXTROSE IN LACTATED RINGERS 5 % IV SOLN
INTRAVENOUS | Status: DC
  Administered 2015-03-10 – 2015-03-11 (×4): via INTRAVENOUS

## 2015-03-10 MED ORDER — MENTHOL 3 MG MT LOZG
1.0000 | LOZENGE | OROMUCOSAL | Status: DC | PRN
  Administered 2015-03-11 – 2015-03-12 (×4): 3 mg via ORAL
  Filled 2015-03-10 (×2): qty 9

## 2015-03-10 MED ORDER — GUAIFENESIN ER 600 MG PO TB12
600.0000 mg | ORAL_TABLET | Freq: Two times a day (BID) | ORAL | Status: DC
  Administered 2015-03-10 – 2015-03-12 (×4): 600 mg via ORAL
  Filled 2015-03-10 (×8): qty 1

## 2015-03-10 MED ORDER — INFLUENZA VAC SPLIT QUAD 0.5 ML IM SUSY: 0.5000 mL | PREFILLED_SYRINGE | INTRAMUSCULAR | Status: DC

## 2015-03-10 MED ORDER — PROMETHAZINE HCL 25 MG/ML IJ SOLN
12.5000 mg | Freq: Four times a day (QID) | INTRAMUSCULAR | Status: DC | PRN
  Administered 2015-03-10 – 2015-03-11 (×3): 12.5 mg via INTRAVENOUS
  Filled 2015-03-10 (×3): qty 1

## 2015-03-10 MED ORDER — ALBUTEROL SULFATE (2.5 MG/3ML) 0.083% IN NEBU
2.5000 mg | INHALATION_SOLUTION | RESPIRATORY_TRACT | Status: DC
  Administered 2015-03-10 – 2015-03-11 (×7): 2.5 mg via RESPIRATORY_TRACT
  Filled 2015-03-10 (×7): qty 3

## 2015-03-10 MED ORDER — HYDROCOD POLST-CPM POLST ER 10-8 MG/5ML PO SUER
5.0000 mL | Freq: Two times a day (BID) | ORAL | Status: DC
  Administered 2015-03-10 – 2015-03-12 (×4): 5 mL via ORAL
  Filled 2015-03-10 (×4): qty 5

## 2015-03-10 MED ORDER — ACETAMINOPHEN 325 MG PO TABS
650.0000 mg | ORAL_TABLET | ORAL | Status: DC | PRN
  Administered 2015-03-11 – 2015-03-12 (×4): 650 mg via ORAL
  Filled 2015-03-10 (×4): qty 2

## 2015-03-10 NOTE — Progress Notes (Signed)
Chest PA & lat is neg. NA 134 and K is 3.4 Will continue to hydrate and dc in am.
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
## 2015-03-11 ENCOUNTER — Encounter: Payer: Self-pay | Admitting: Emergency Medicine

## 2015-03-11 DIAGNOSIS — O210 Mild hyperemesis gravidarum: Secondary | ICD-10-CM | POA: Diagnosis not present

## 2015-03-11 LAB — INFLUENZA PANEL BY PCR (TYPE A & B)
H1N1 flu by pcr: NOT DETECTED
INFLAPCR: NEGATIVE
Influenza B By PCR: NEGATIVE

## 2015-03-11 MED ORDER — ALBUTEROL SULFATE (2.5 MG/3ML) 0.083% IN NEBU: 2.5000 mg | INHALATION_SOLUTION | RESPIRATORY_TRACT | Status: DC | PRN

## 2015-03-11 MED ORDER — PROMETHAZINE HCL 25 MG PO TABS
25.0000 mg | ORAL_TABLET | ORAL | Status: DC | PRN
  Filled 2015-03-11: qty 1

## 2015-03-11 NOTE — Progress Notes (Signed)
Milon Scorearon Jones, CNM contact on behalf of pt's primary care RN, Shirlean KellyJennifer Holmes regarding report of swelling and tenderness at IV site.  Order received to discontinue IV and to leave IV out.  PO Phenergan order received. Reynold BowenSusan Paisley Ranvir Renovato, RN 03/11/2015 6:31 PM

## 2015-03-11 NOTE — Progress Notes (Signed)
S: Still coughing severely non-productive with some relief from the Tussionex. Able to keep fluids down this am. Feels improved with breathing treatments. O:  Filed Vitals:   03/11/15 0845  BP: 125/70  Pulse: 103  Temp: 98.2 F (36.8 C)  Resp: 20  O: Gen: 24 yo coughing excessively on rounds. Non-productive. HEENT: normocephalic, Eyes non-icteric. Ears: normal TM's, Throat: no erythema, no exudate from yest exam HEART: S1S2, RRR, No M/R/G. LUNGS: CTA bilat, no W/R/R.  Abd: 12 weeks pregnancy with +FHT's Extrems: no edema, neg Homans Labs: Neg Flu A & B, Chest xray is clear, no pneumonia. See I&O's Voiding well Taking po well this am A: IUP at 12 2/7 weeks 2. URI Viral Illness P: 1. Continue hydration with IV for today. 2. Continue Breathing tx's per Resp. 3. Allow pt to eat if able 4. Admit as OBS expires today. 5. Plan to dc in am

## 2015-03-12 DIAGNOSIS — O210 Mild hyperemesis gravidarum: Secondary | ICD-10-CM | POA: Diagnosis not present

## 2015-03-12 MED ORDER — HYDROCOD POLST-CPM POLST ER 10-8 MG/5ML PO SUER: 5.0000 mL | Freq: Two times a day (BID) | ORAL | Status: DC

## 2015-03-12 MED ORDER — HYDROCOD POLST-CPM POLST ER 10-8 MG/5ML PO SUER: 5.0000 mL | Freq: Two times a day (BID) | ORAL | Status: DC | PRN

## 2015-03-12 NOTE — Progress Notes (Addendum)
ANTEPARTUM  Discharge Summary   Patient ID: Erin [name] MRN: [id number] DOB/AGE: 25/04/1990 25 y.o.   Date of Admission: 03/10/2015  Date of Discharge: 03/12/15  Admitting Diagnosis: URI , IUP at 12 weeks, Asthma, Cough  Secondary Diagnosis: IUP at 12-13 weeks, Hyperemesis     Discharge Diagnosis: Asthma, URI, IUP at 12-13 weeks,   Complications: none   Brief Hospital Course  Erin [name] is a G1P0000 who has Asthma and was admitted for hydration due  URI, severe cough, hyperemesis due to the coughing and dehydration.   Patient had an uncomplicated antepartum  course.  By time of discharge on PAD3, her cough was controlled on oral cough  medications; Breathing treatments improved lungs and was ambulating, voiding without difficulty and tolerating regular diet.  She was deemed stable for discharge to home.    Labs: CBC Latest Ref Rng 03/10/2015 03/07/2015 08/20/2012  WBC 3.6 - 11.0 K/uL 10.0 7.6 -  Hemoglobin 12.0 - 16.0 g/dL 13.013.0 86.512.5 -  Hematocrit 35.0 - 47.0 % 38.0 37.1 36.3  Platelets 150 - 440 K/uL 250 271 -    Physical exam:  Blood pressure 110/96, pulse 100, temperature 98.2 F (36.8 C), temperature source Oral, resp. rate 18, height 5\' 9"  (1.753 m), weight 189 lb 12.8 oz (86.093 kg), SpO2 98 %. General: alert and no distress Heart:S1S2, RRR, No M/R/G. Lungs: CTA bilat, no W/R/R. Abdomen: soft, NT, IUP at 12 weeks, +FHR per Doppler Extremities: No evidence of DVT seen on physical exam. No lower extremity edema.  Discharge Instructions: Per After Visit Summary. Activity: Advance as tolerated.Pt has Albuterol inhaler and will use 2 puffs q 4 h prn wheezing.  Also refer to After Visit Summary Diet: Regular Medications:   Medication List    ASK your doctor about these medications        albuterol 108 (90 BASE) MCG/ACT inhaler  Commonly known as:  PROVENTIL HFA;VENTOLIN HFA  Inhale 2 puffs into the lungs every 6 (six) hours as needed for wheezing or  shortness of breath.     calcium carbonate 500 MG chewable tablet  Commonly known as:  TUMS - dosed in mg elemental calcium  Chew 1 tablet by mouth daily.     cyclobenzaprine 10 MG tablet  Commonly known as:  FLEXERIL  Take 10 mg by mouth 3 (three) times daily as needed for muscle spasms (Dispense 30, 2 refills).     nitrofurantoin (macrocrystal-monohydrate) 100 MG capsule  Commonly known as:  MACROBID  Take 1 capsule (100 mg total) by mouth 2 (two) times daily.     predniSONE 20 MG tablet  Commonly known as:  DELTASONE  Take 2 tablets (40 mg total) by mouth once.     prenatal multivitamin Tabs tablet  Take 1 tablet by mouth daily.     pyridOXINE 25 MG tablet  Commonly known as:  VITAMIN B-6  Take 1 tablet (25 mg total) by mouth every 8 (eight) hours as needed.       Outpatient follow up: 1 week   Discharged Condition: Stable  Discharged to: home FU in 1 week Out of work x 1 week   Sharee Pimplearon W Jones, CNM 03/12/2015

## 2015-03-12 NOTE — Progress Notes (Signed)
Patient understands all discharge instructions and the need to make follow up appointments. Patient discharge via wheelchair with RN. 
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
## 2015-03-16 ENCOUNTER — Ambulatory Visit (HOSPITAL_BASED_OUTPATIENT_CLINIC_OR_DEPARTMENT_OTHER)
Admission: RE | Admit: 2015-03-16 | Discharge: 2015-03-16 | Disposition: A | Discharge status: Home / Self Care | Payer: Medicaid Other | Source: Ambulatory Visit | Attending: Obstetrics and Gynecology | Admitting: Obstetrics and Gynecology

## 2015-03-16 ENCOUNTER — Ambulatory Visit
Admission: RE | Admit: 2015-03-16 | Discharge: 2015-03-16 | Disposition: A | Discharge status: Home / Self Care | Payer: Medicaid Other | Source: Ambulatory Visit | Attending: Obstetrics and Gynecology | Admitting: Obstetrics and Gynecology

## 2015-03-16 DIAGNOSIS — Z8279 Family history of other congenital malformations, deformations and chromosomal abnormalities: Secondary | ICD-10-CM

## 2015-03-16 DIAGNOSIS — Z3480 Encounter for supervision of other normal pregnancy, unspecified trimester: Secondary | ICD-10-CM | POA: Insufficient documentation

## 2015-03-16 DIAGNOSIS — Z369 Encounter for antenatal screening, unspecified: Secondary | ICD-10-CM

## 2015-03-16 HISTORY — DX: Family history of other congenital malformations, deformations and chromosomal abnormalities: Z82.79

## 2015-03-16 NOTE — Progress Notes (Signed)
Pt seen and prenatal diagnosis plan reviewed  Normal first tri u/s today - declined first tri screen  Recommended anatomy scan at 17-19 weeks  Jimmey RalphLivingston, Vyom Brass, MD

## 2015-03-16 NOTE — Progress Notes (Addendum)
Referring Provider:   [facility] Length of Consultation: 45 minutes   Erin [name] was referred for genetic counseling to discuss her family history and prenatal testing options.  This note is a summary of our discussion.  We first obtained a detailed family history. Ms. Guiffre reported that her paternal first cousin has Down syndrome.  She also reported a third cousin to the fetus with open spina bifida.  There is no other family history of children with birth defects, intellectual disabilities, multiple miscarriages or chromosome conditions.  She also denied any complications during this pregnancy or exposures to recreational drugs, tobacco or alcohol.  Ms. Chavers did state that she has taken antibiotics, cold medications and asthma medications as prescribed by her doctor during this pregnancy, though none of these are expected to increase the risk for birth defects.  We discussed that chromosomes are the inherited structures that contain our instructions for development (genes).  Each cell of our body normally has 46 chromosomes, matched up into 23 pairs.  The last pair determines our gender and are called the sex chromosomes.  A female has an X and a Y chromosome, while a female has two X chromosomes.  Rarely, when a mother's egg and father's sperm unite, an extra or missing chromosome can be passed on to the baby by mistake.  Changes in the number or the structure of the chromosomes may result in a child with some degree of mental retardation and physical problems.  Down syndrome is caused by having three copies (instead of the usual two copies) of the genes on chromosome number 21.  There are two types of Down syndrome.  Most often (about 95% of the time), Down syndrome is caused by an entire third copy of chromosome 21, known as Trisomy 82.  In the other cases, Down syndrome is caused by a rearrangement of the chromosomes, known as a translocation.  Because we do not have  documentation of the type of Down syndrome, we discussed the recurrence of both types.  If it is the more common type, this is thought to be a sporadic event that would not be likely to happen again in the family and should not increase the chance for Ms. Lagos to have a child with Down syndrome.  If her cousin has the translocation type of Down syndrome, the recurrence risk may be higher depending upon which, if any, family members may be translocation carriers.   We reviewed that open spina bifida occur when the tissues that cover the spinal column do not fuse properly.  This may result in damage to the tissue of the spine and cause paralysis, weakness in the lower body, problems with bowel or bladder control and possibly developmental differences.  Most often, neural tube defects occur as an isolated birth defect, however, some are the result of changes in genes or in the number or structure of the chromosomes.  In the absence of other birth defects or a known genetic syndrome as the cause, given the distance of this relative to the current pregnancy, the chance for a neural tube defect is not expected to be increased above the general population risk of 1-2 per 1,000. Ms. Sprong could consider routine screening with anatomy ultrasound and MSAFP testing to assess for open neural tube defects at 16-[redacted] weeks gestation.  We discussed the following prenatal screening and testing options for this pregnancy:  First trimester screening, which includes nuchal translucency ultrasound screen and first trimester maternal serum  marker screening.  The nuchal translucency has approximately an 80% detection rate for Down syndrome and can be positive for other chromosome abnormalities as well as heart defects.  When combined with a maternal serum marker screening, the detection rate is up to 90% for Down syndrome and up to 97% for trisomy 18.      Maternal serum marker screening, a blood test that measures pregnancy  proteins, can provide risk assessments for Down syndrome, trisomy 18, and open neural tube defects (spina bifida, anencephaly). Because it does not directly examine the fetus, it cannot positively diagnose or rule out these problems.  It is able to detect approximately 75% of babies with Down syndrome, 60% of babies with trisomy 18, and 80% of babies with spina bifida.  Targeted ultrasound uses high frequency sound waves to create an image of the developing fetus.  An ultrasound is often recommended as a routine means of evaluating the pregnancy.  It is also used to screen for fetal anatomy problems (for example, a heart defect) that might be suggestive of a chromosomal or other abnormality.   Should any of these screening tests show an increased risk, then the following testing options would be available:  The chorionic villus sampling procedure is available for first trimester chromosome analysis.  This involves the withdrawal of a small amount of chorionic villi (tissue from the developing placenta).  Risk of pregnancy loss is estimated to be approximately 1 in 200 to 1 in 100 (0.5 to 1%).  There is approximately a 1% (1 in 100) chance that the CVS chromosome results will be unclear.  Chorionic villi cannot be tested for neural tube defects.     Amniocentesis involves the removal of a small amount of amniotic fluid from the sac surrounding the fetus ("bag of water") with the use of a thin needle inserted through the mother's abdomen and uterus.  Ultrasound guidance is used throughout the procedure.  Fetal cells from amniotic fluid are directly evaluated and > 99.5% of chromosome problems and > 98% of open neural tube defects can be detected. This procedure is generally performed after the 15th week of pregnancy.  The main risks to this procedure include complications leading to miscarriage in less than 1 in 200 cases (0.5%).We also obtained a detailed pregnancy history.  You reported no complications in  the pregnancy.  You deny any illnesses, infections, recreational drugs, alcohol or tobacco.  You are currently taking Nifedipine for hypertension.  As with any medication during pregnancy, we must weigh the health of the mother against any possible adverse effects to the fetus from the medication.  Therefore, it is important that you continue any medications that you need for your continued good health.  This medication is not known to increase the risk for birth defects.  We also reviewed the availability of cell free fetal DNA testing from maternal blood to determine whether or not the baby may have either Down syndrome, trisomy 4213, or trisomy 7818.  This test utilizes a maternal blood sample and DNA sequencing technology to isolate circulating cell free fetal DNA from maternal plasma.  The fetal DNA can then be analyzed for DNA sequences that are derived from the three most common chromosomes involved in aneuploidy, chromosomes 13, 18, and 21.  If the overall amount of DNA is greater than the expected level for any of these chromosomes, aneuploidy is suspected.  While we do not consider it a replacement for invasive testing and karyotype analysis, a negative result  from this testing would be reassuring, though not a guarantee of a normal chromosome complement for the baby.  An abnormal result is certainly suggestive of an abnormal chromosome complement, though we would still recommend CVS or amniocentesis to confirm any findings from this testing.  We discussed the prenatal options in detail and after consideration, the patient elected to decline first trimester screening and to have an ultrasound for dating.  See that report for details.  We appreciate very much the opportunity to be involved with the care of this patient.  If the patient has further questions or concerns, please do not hesitate to call us at [phone].  Cherly Anderson, MS, CGC

## 2015-06-05 DIAGNOSIS — M5432 Sciatica, left side: Secondary | ICD-10-CM | POA: Insufficient documentation

## 2015-07-04 ENCOUNTER — Observation Stay
Admission: EM | Admit: 2015-07-04 | Discharge: 2015-07-05 | Disposition: A | Discharge status: Home / Self Care | Payer: Medicaid Other | Attending: Obstetrics and Gynecology | Admitting: Obstetrics and Gynecology

## 2015-07-04 DIAGNOSIS — Z3A28 28 weeks gestation of pregnancy: Secondary | ICD-10-CM | POA: Insufficient documentation

## 2015-07-04 DIAGNOSIS — G8929 Other chronic pain: Secondary | ICD-10-CM | POA: Insufficient documentation

## 2015-07-04 DIAGNOSIS — O26893 Other specified pregnancy related conditions, third trimester: Principal | ICD-10-CM | POA: Insufficient documentation

## 2015-07-04 DIAGNOSIS — M545 Low back pain: Secondary | ICD-10-CM | POA: Insufficient documentation

## 2015-07-04 DIAGNOSIS — R112 Nausea with vomiting, unspecified: Secondary | ICD-10-CM | POA: Insufficient documentation

## 2015-07-04 DIAGNOSIS — O219 Vomiting of pregnancy, unspecified: Secondary | ICD-10-CM | POA: Diagnosis present

## 2015-07-04 NOTE — OB Triage Note (Signed)
Patient presents with c/o intermittent upper abdominal and lower back pain since this am, back pain since Monday. Vomited twice 0200 and 2300, hours after eating hot dog and french fries.  Denies heartburn.  Abdomen palpates soft, non-tender.  efm and toco applied.  fhr-140.  Denies any vaginal bleeding, cramping, s/s srom.  Patient noted to have frequent belching.

## 2015-07-05 DIAGNOSIS — O219 Vomiting of pregnancy, unspecified: Secondary | ICD-10-CM | POA: Diagnosis present

## 2015-07-05 DIAGNOSIS — O26893 Other specified pregnancy related conditions, third trimester: Secondary | ICD-10-CM | POA: Diagnosis not present

## 2015-07-05 DIAGNOSIS — G8929 Other chronic pain: Secondary | ICD-10-CM | POA: Diagnosis not present

## 2015-07-05 DIAGNOSIS — R112 Nausea with vomiting, unspecified: Secondary | ICD-10-CM | POA: Diagnosis not present

## 2015-07-05 DIAGNOSIS — M545 Low back pain: Secondary | ICD-10-CM | POA: Diagnosis not present

## 2015-07-05 DIAGNOSIS — Z3A28 28 weeks gestation of pregnancy: Secondary | ICD-10-CM | POA: Diagnosis not present

## 2015-07-05 HISTORY — DX: Vomiting of pregnancy, unspecified: O21.9

## 2015-07-05 LAB — URINALYSIS COMPLETE WITH MICROSCOPIC (ARMC ONLY)
BACTERIA UA: NONE SEEN
Bilirubin Urine: NEGATIVE
Glucose, UA: NEGATIVE mg/dL
HGB URINE DIPSTICK: NEGATIVE
LEUKOCYTES UA: NEGATIVE
NITRITE: NEGATIVE
PH: 7 (ref 5.0–8.0)
PROTEIN: 30 mg/dL — AB
SPECIFIC GRAVITY, URINE: 1.026 (ref 1.005–1.030)

## 2015-07-05 LAB — COMPREHENSIVE METABOLIC PANEL
ALBUMIN: 2.8 g/dL — AB (ref 3.5–5.0)
ALT: 15 U/L (ref 14–54)
AST: 17 U/L (ref 15–41)
Alkaline Phosphatase: 61 U/L (ref 38–126)
Anion gap: 6 (ref 5–15)
BILIRUBIN TOTAL: 0.6 mg/dL (ref 0.3–1.2)
BUN: 9 mg/dL (ref 6–20)
CHLORIDE: 103 mmol/L (ref 101–111)
CO2: 29 mmol/L (ref 22–32)
CREATININE: 0.48 mg/dL (ref 0.44–1.00)
Calcium: 7.7 mg/dL — ABNORMAL LOW (ref 8.9–10.3)
GFR calc Af Amer: >60 mL/min (ref >60)
GFR calc non Af Amer: >60 mL/min (ref >60)
GLUCOSE: 100 mg/dL — AB (ref 65–99)
POTASSIUM: 3.7 mmol/L (ref 3.5–5.1)
Sodium: 138 mmol/L (ref 135–145)
TOTAL PROTEIN: 6.4 g/dL — AB (ref 6.5–8.1)

## 2015-07-05 LAB — AMYLASE: Amylase: 54 U/L (ref 28–100)

## 2015-07-05 LAB — LIPASE, BLOOD: Lipase: 15 U/L (ref 11–51)

## 2015-07-05 MED ORDER — PROMETHAZINE HCL 25 MG PO TABS
25.0000 mg | ORAL_TABLET | ORAL | Status: DC | PRN
  Administered 2015-07-05: 25 mg via ORAL
  Filled 2015-07-05: qty 1

## 2015-07-05 MED ORDER — ONDANSETRON HCL 4 MG PO TABS: 8.0000 mg | ORAL_TABLET | Freq: Three times a day (TID) | ORAL | Status: DC | PRN

## 2015-07-05 NOTE — Discharge Instructions (Signed)
Drink Plenty of water and get plenty of rest. Return to emergency room if symptoms worsen.

## 2015-07-05 NOTE — Discharge Summary (Signed)
  Progress Notes by Suzy Bouchardhomas J Schermerhorn, MD at 07/05/2015 9:14 AM    Author: Suzy Bouchardhomas J Schermerhorn, MD Service: Obstetrics Author Type: Physician   Filed: 07/05/2015 9:18 AM Note Time: 07/05/2015 9:14 AM Status: Signed   Editor: Suzy Bouchardhomas J Schermerhorn, MD (Physician)     Expand All Collapse All   Patient ID: Erin [name], female DOB: 01/21/1991, [age] y.o. MRN: [id number] .Glade Stanfordricia D Datta 08/15/1990 G3 P1 3458w6d presents for Nausea and vomiting for the last 1 day . Also with LBP ( chronic ) which has flared .  No LOF, NO vaginal bleeding , O;BP 101/50 mmHg  Pulse 77  Temp(Src) 98 F (36.7 C) (Oral)  Resp 18  Ht 5\' 9"  (1.753 m)  Wt 201 lb (91.173 kg)  BMI 29.67 kg/m2  LMP 12/15/2014 ABDsoft NT  CX Closed  NST reactive , no decels  Labs: amylase , lipase , ua , cmp wnl  A: Nausea , resolved over night with 1 dose phenergan  Reassuring fetal monitoring   P:d/c home  Add mylanta qid , rtc to see me tomorrow for c/s scheduling  Warm compresses to back

## 2015-07-05 NOTE — Progress Notes (Signed)
Patient ID: Erin [name], female   DOB: 12/08/1990, 25 y.o.   MRN: [id number] .Erin [name] 11/26/1990 G3 P1 2336w6d presents for  Nausea and vomiting for the last 1 day . Also with LBP ( chronic ) which has flared .  No LOF,  NO vaginal bleeding , O;BP 101/50 mmHg  Pulse 77  Temp(Src) 98 F (36.7 C) (Oral)  Resp 18  Ht 5\' 9"  (1.753 m)  Wt 201 lb (91.173 kg)  BMI 29.67 kg/m2  LMP 12/15/2014 ABDsoft NT  CX  Closed  NST reactive , no decels  Labs: amylase , lipase , ua , cmp  wnl  A: Nausea , resolved over night with 1 dose phenergan  Reassuring fetal monitoring   P:d/c home  Add mylanta qid , rtc to see me tomorrow for c/s scheduling  Warm compresses to back

## 2015-07-31 ENCOUNTER — Ambulatory Visit: Payer: Medicaid Other | Admitting: Physical Therapy

## 2015-08-09 ENCOUNTER — Inpatient Hospital Stay: Admission: RE | Admit: 2015-08-09 | Payer: Medicaid Other | Source: Ambulatory Visit

## 2015-08-12 ENCOUNTER — Observation Stay
Admission: EM | Admit: 2015-08-12 | Discharge: 2015-08-12 | Disposition: A | Discharge status: Home / Self Care | Payer: 59 | Attending: Obstetrics and Gynecology | Admitting: Obstetrics and Gynecology

## 2015-08-12 DIAGNOSIS — K5900 Constipation, unspecified: Secondary | ICD-10-CM | POA: Insufficient documentation

## 2015-08-12 DIAGNOSIS — Z3A35 35 weeks gestation of pregnancy: Secondary | ICD-10-CM | POA: Insufficient documentation

## 2015-08-12 DIAGNOSIS — O4290 Premature rupture of membranes, unspecified as to length of time between rupture and onset of labor, unspecified weeks of gestation: Secondary | ICD-10-CM

## 2015-08-12 DIAGNOSIS — O9989 Other specified diseases and conditions complicating pregnancy, childbirth and the puerperium: Secondary | ICD-10-CM | POA: Diagnosis present

## 2015-08-12 DIAGNOSIS — O99613 Diseases of the digestive system complicating pregnancy, third trimester: Principal | ICD-10-CM | POA: Insufficient documentation

## 2015-08-12 HISTORY — DX: Premature rupture of membranes, unspecified as to length of time between rupture and onset of labor, unspecified weeks of gestation: O42.90

## 2015-08-12 HISTORY — DX: Depression, unspecified: F32.A

## 2015-08-12 HISTORY — DX: Major depressive disorder, single episode, unspecified: F32.9

## 2015-08-12 NOTE — Discharge Summary (Signed)
Physician Discharge Summary  Patient ID: Erin [name] MRN: [id number] DOB/AGE: 26/04/1990 24 y.o.  Admit date: 08/12/2015 Discharge date: 08/12/2015  Admission Diagnoses: Leakage of fluid  Discharge Diagnoses: No ROM Active Problems:   * No active hospital problems. *   Discharged Condition: good LMP 12/15/2014 EDD: 09/13/15  [facility]  Hospital Course: .Erin [name]  G3 P1 6115w3d presents forleakage of fluid. Pt was at a BBQ and needed to urinate, then noticed liquid trickling down her leg. No ctx, no vb. +FM. Some constipation.   No evidence of ROM. Not in labor. Recommended colace/prune juice for constipation.  Tawana Scale; Filed Vitals:   08/12/15 1534 08/12/15 1538  BP: 128/78   Pulse: 113   Temp:  98.7 F (37.1 C)  Resp:  18    ABDsoft NT  CX Closed , thick, post (per RN) NST 150 mod var +accels, no decels Toco: irritable  Significant Diagnostic Studies: no pooling, nit neg   Discharge Exam: Blood pressure 128/78, pulse 113, temperature 98.7 F (37.1 C), temperature source Oral, resp. rate 18, height 5\' 9"  (1.753 m), weight 97.523 kg (215 lb), last menstrual period 12/15/2014. General appearance: alert, cooperative and no distress Extremities: extremities normal, atraumatic, no cyanosis or edema  Disposition: 01-Home or Self Care Precautions reviewed.     Medication List    ASK your doctor about these medications        albuterol 108 (90 Base) MCG/ACT inhaler  Commonly known as:  PROVENTIL HFA;VENTOLIN HFA  Inhale 2 puffs into the lungs every 6 (six) hours as needed for wheezing or shortness of breath.     chlorpheniramine-HYDROcodone 10-8 MG/5ML Suer  Commonly known as:  TUSSIONEX PENNKINETIC ER  Take 5 mLs by mouth every 12 (twelve) hours as needed for cough. Substitute whatever is approved for generic for Medicaid patients.     cyclobenzaprine 10 MG tablet  Commonly known as:  FLEXERIL  Take 10 mg by mouth 3 (three) times daily as  needed for muscle spasms (Dispense 30, 2 refills).     prenatal multivitamin Tabs tablet  Take 1 tablet by mouth daily.     pyridOXINE 25 MG tablet  Commonly known as:  VITAMIN B-6  Take 1 tablet (25 mg total) by mouth every 8 (eight) hours as needed.           Follow-up Information    Follow up with Ala DachJohanna K Serjio Deupree, MD On 08/16/2015.   Specialty:  Obstetrics and Gynecology   Contact information:   [address] [place] [address] [phone]       Signed: Ala DachJohanna K Leira Regino 08/12/2015, 4:24 PM

## 2015-08-15 NOTE — H&P (Signed)
Erin [name] is a 25 y.o. female presenting for elective repeat c/s and BTL  And on Q pump placement . EDC 09/13/15 based on 14 week u/s. Marland Kitchen. History OB History    Gravida Para Term Preterm AB TAB SAB Ectopic Multiple Living   3 1 1  0 1 0 1 0 0 1     Past Medical History  Diagnosis Date  . Asthma   . Seasonal allergies   . Seasonal allergies   . Complication of anesthesia     Felt everything with spinal ,so had to be put to sleep during C/S  . Depression     postpartum depression in past   Past Surgical History  Procedure Laterality Date  . Tonsillectomy    . Cesarean section  2014   Family History: family history includes Arthritis in her mother; Cancer in her father and mother; Diabetes in her mother; Hypertension in her mother. Social History:  reports that she has never smoked. She does not have any smokeless tobacco history on file. She reports that she does not drink alcohol or use illicit drugs.   Prenatal Transfer Tool  Maternal Diabetes: No Genetic Screening: declined Maternal Ultrasounds/Referrals: Normal Fetal Ultrasounds or other Referrals:  None Maternal Substance Abuse:  No Significant Maternal Medications:  None Significant Maternal Lab Results:  None Other Comments:  None  ROS    Last menstrual period 12/15/2014. Exam Physical Exam  Lungs CTA  CV RRR  Abd : gravid   Prenatal labs: ABO, Rh:AB+ Antibody:neg Rubella: immune NR RPR:     HBsAg: neg HIV:neg GBS: results pend  Assessment/Plan: Elective repeat LTCS + BTL+ ON Q pump  Surgery scheduled at 39+2 weeks based on U/S    Erin [name] 08/15/2015, 9:45 AM

## 2015-08-16 LAB — OB RESULTS CONSOLE GBS: STREP GROUP B AG: NEGATIVE

## 2015-09-07 ENCOUNTER — Encounter
Admission: RE | Admit: 2015-09-07 | Discharge: 2015-09-07 | Disposition: A | Discharge status: Home / Self Care | Payer: Medicaid Other | Source: Ambulatory Visit | Attending: Obstetrics and Gynecology | Admitting: Obstetrics and Gynecology

## 2015-09-07 HISTORY — DX: Gastro-esophageal reflux disease without esophagitis: K21.9

## 2015-09-07 HISTORY — DX: Unspecified convulsions: R56.9

## 2015-09-07 LAB — CBC
HCT: 37 % (ref 35.0–47.0)
Hemoglobin: 12.8 g/dL (ref 12.0–16.0)
MCH: 31 pg (ref 26.0–34.0)
MCHC: 34.6 g/dL (ref 32.0–36.0)
MCV: 89.6 fL (ref 80.0–100.0)
PLATELETS: 193 10*3/uL (ref 150–440)
RBC: 4.13 MIL/uL (ref 3.80–5.20)
RDW: 15.2 % — AB (ref 11.5–14.5)
WBC: 8.5 10*3/uL (ref 3.6–11.0)

## 2015-09-07 LAB — BASIC METABOLIC PANEL
Anion gap: 9 (ref 5–15)
BUN: 8 mg/dL (ref 6–20)
CALCIUM: 8.9 mg/dL (ref 8.9–10.3)
CO2: 21 mmol/L — ABNORMAL LOW (ref 22–32)
CREATININE: 0.42 mg/dL — AB (ref 0.44–1.00)
Chloride: 105 mmol/L (ref 101–111)
GFR calc Af Amer: >60 mL/min (ref >60)
GLUCOSE: 109 mg/dL — AB (ref 65–99)
POTASSIUM: 3.3 mmol/L — AB (ref 3.5–5.1)
SODIUM: 135 mmol/L (ref 135–145)

## 2015-09-07 LAB — TYPE AND SCREEN
ABO/RH(D): AB POS
ANTIBODY SCREEN: NEGATIVE
Extend sample reason: UNDETERMINED

## 2015-09-07 LAB — ABO/RH: ABO/RH(D): AB POS

## 2015-09-07 NOTE — Patient Instructions (Signed)
  Your procedure is scheduled on: 09/08/15 arrive at 5:30am Report to Same Day Surgery 3nd floor medical mall labor and delivery   Remember: Instructions that are not followed completely may result in serious medical risk, up to and including death, or upon the discretion of your surgeon and anesthesiologist your surgery may need to be rescheduled.    _x___ 1. Do not eat food or drink liquids after midnight. No gum chewing or hard candies.     ____ 2. No Alcohol for 24 hours before or after surgery.   ____ 3. Bring all medications with you on the day of surgery if instructed.    __x__ 4. Notify your doctor if there is any change in your medical condition     (cold, fever, infections).     Do not wear jewelry, make-up, hairpins, clips or nail polish.  Do not wear lotions, powders, or perfumes. You may wear deodorant.  Do not shave 48 hours prior to surgery. Men may shave face and neck.  Do not bring valuables to the hospital.    [facility] is not responsible for any belongings or valuables.               Contacts, dentures or bridgework may not be worn into surgery.  Leave your suitcase in the car. After surgery it may be brought to your room.  For patients admitted to the hospital, discharge time is determined by your treatment team.   Patients discharged the day of surgery will not be allowed to drive home.    Please read over the following fact sheets that you were given:   [facility] Preparing for Surgery and or MRSA Information   _x___ Take these medicines the morning of surgery with A SIP OF WATER:    1. Albuterol as needed  2.  3.  4.  5.  6.  ____ Fleet Enema (as directed)   _x___ Use CHG Soap or sage wipes as directed on instruction sheet   ____ Use inhalers on the day of surgery and bring to hospital day of surgery  ____ Stop metformin 2 days prior to surgery    ____ Take 1/2 of usual insulin dose the night before surgery and none on the morning of            surgery.   ____ Stop aspirin or coumadin, or plavix  _x__ Stop Anti-inflammatories such as Advil, Aleve, Ibuprofen, Motrin, Naproxen,          Naprosyn, Goodies powders or aspirin products. Ok to take Tylenol.   ____ Stop supplements until after surgery.    ____ Bring C-Pap to the hospital.

## 2015-09-07 NOTE — Pre-Procedure Instructions (Signed)
Cardiac clearance on chart. 

## 2015-09-08 ENCOUNTER — Encounter
Admission: RE | Disposition: A | Discharge status: Home / Self Care | Payer: Self-pay | Source: Ambulatory Visit | Attending: Obstetrics and Gynecology

## 2015-09-08 ENCOUNTER — Inpatient Hospital Stay
Admission: RE | Admit: 2015-09-08 | Discharge: 2015-09-11 | DRG: 766 | Disposition: A | Discharge status: Home / Self Care | Payer: Medicaid Other | Source: Ambulatory Visit | Attending: Obstetrics and Gynecology | Admitting: Obstetrics and Gynecology

## 2015-09-08 ENCOUNTER — Inpatient Hospital Stay: Payer: Medicaid Other | Admitting: Anesthesiology

## 2015-09-08 DIAGNOSIS — Z8279 Family history of other congenital malformations, deformations and chromosomal abnormalities: Secondary | ICD-10-CM

## 2015-09-08 DIAGNOSIS — Z8249 Family history of ischemic heart disease and other diseases of the circulatory system: Secondary | ICD-10-CM | POA: Diagnosis not present

## 2015-09-08 DIAGNOSIS — Z302 Encounter for sterilization: Secondary | ICD-10-CM

## 2015-09-08 DIAGNOSIS — Z9889 Other specified postprocedural states: Secondary | ICD-10-CM

## 2015-09-08 DIAGNOSIS — Z833 Family history of diabetes mellitus: Secondary | ICD-10-CM

## 2015-09-08 DIAGNOSIS — O34211 Maternal care for low transverse scar from previous cesarean delivery: Principal | ICD-10-CM | POA: Diagnosis present

## 2015-09-08 DIAGNOSIS — Z8261 Family history of arthritis: Secondary | ICD-10-CM | POA: Diagnosis not present

## 2015-09-08 HISTORY — DX: Other specified postprocedural states: Z98.890

## 2015-09-08 SURGERY — Surgical Case
Anesthesia: Regional | Laterality: Bilateral | Wound class: Clean Contaminated

## 2015-09-08 MED ORDER — OXYTOCIN 40 UNITS IN LACTATED RINGERS INFUSION - SIMPLE MED
INTRAVENOUS | Status: DC | PRN
  Administered 2015-09-08 (×2): 4 mL via INTRAVENOUS

## 2015-09-08 MED ORDER — NALBUPHINE HCL 10 MG/ML IJ SOLN: 5.0000 mg | Freq: Once | INTRAMUSCULAR | Status: DC | PRN

## 2015-09-08 MED ORDER — NALBUPHINE HCL 10 MG/ML IJ SOLN: 5.0000 mg | INTRAMUSCULAR | Status: DC | PRN

## 2015-09-08 MED ORDER — DIBUCAINE 1 % RE OINT: 1.0000 "application " | TOPICAL_OINTMENT | RECTAL | Status: DC | PRN

## 2015-09-08 MED ORDER — SODIUM CHLORIDE 0.9% FLUSH: 3.0000 mL | INTRAVENOUS | Status: DC | PRN

## 2015-09-08 MED ORDER — MORPHINE SULFATE (PF) 0.5 MG/ML IJ SOLN
INTRAMUSCULAR | Status: DC | PRN
  Administered 2015-09-08: .2 mg via EPIDURAL

## 2015-09-08 MED ORDER — DEXTROSE 5 % IV SOLN
1.0000 ug/kg/h | INTRAVENOUS | Status: DC | PRN
Start: 2015-09-08 — End: 2015-09-11
  Filled 2015-09-08: qty 2

## 2015-09-08 MED ORDER — FENTANYL CITRATE (PF) 100 MCG/2ML IJ SOLN
INTRAMUSCULAR | Status: DC | PRN
  Administered 2015-09-08: 20 ug via INTRAVENOUS

## 2015-09-08 MED ORDER — TETANUS-DIPHTH-ACELL PERTUSSIS 5-2.5-18.5 LF-MCG/0.5 IM SUSP: 0.5000 mL | Freq: Once | INTRAMUSCULAR | Status: DC

## 2015-09-08 MED ORDER — LACTATED RINGERS IV SOLN
INTRAVENOUS | Status: DC
  Administered 2015-09-08: 06:00:00 via INTRAVENOUS

## 2015-09-08 MED ORDER — SIMETHICONE 80 MG PO CHEW
80.0000 mg | CHEWABLE_TABLET | ORAL | Status: DC
  Administered 2015-09-09 – 2015-09-10 (×2): 80 mg via ORAL
  Filled 2015-09-08: qty 1

## 2015-09-08 MED ORDER — MENTHOL 3 MG MT LOZG: 1.0000 | LOZENGE | OROMUCOSAL | Status: DC | PRN

## 2015-09-08 MED ORDER — SIMETHICONE 80 MG PO CHEW
80.0000 mg | CHEWABLE_TABLET | ORAL | Status: DC | PRN
  Administered 2015-09-09 – 2015-09-10 (×2): 80 mg via ORAL
  Filled 2015-09-08: qty 1

## 2015-09-08 MED ORDER — DIPHENHYDRAMINE HCL 50 MG/ML IJ SOLN
12.5000 mg | INTRAMUSCULAR | Status: DC | PRN
  Administered 2015-09-08: 12.5 mg via INTRAVENOUS
  Filled 2015-09-08: qty 1

## 2015-09-08 MED ORDER — ONDANSETRON HCL 4 MG/2ML IJ SOLN: 4.0000 mg | Freq: Three times a day (TID) | INTRAMUSCULAR | Status: DC | PRN

## 2015-09-08 MED ORDER — PHENYLEPHRINE HCL 10 MG/ML IJ SOLN
INTRAMUSCULAR | Status: DC | PRN
  Administered 2015-09-08: 50 ug via INTRAVENOUS

## 2015-09-08 MED ORDER — BUPIVACAINE 0.25 % ON-Q PUMP DUAL CATH 400 ML
INJECTION | Status: AC
  Filled 2015-09-08: qty 400

## 2015-09-08 MED ORDER — DIPHENHYDRAMINE HCL 25 MG PO CAPS
25.0000 mg | ORAL_CAPSULE | ORAL | Status: DC | PRN
  Administered 2015-09-08 – 2015-09-09 (×5): 25 mg via ORAL
  Filled 2015-09-08 (×3): qty 1

## 2015-09-08 MED ORDER — BUPIVACAINE HCL (PF) 0.5 % IJ SOLN
INTRAMUSCULAR | Status: DC | PRN
  Administered 2015-09-08: 10 mL

## 2015-09-08 MED ORDER — COCONUT OIL OIL
1.0000 | TOPICAL_OIL | Status: DC | PRN
Start: 2015-09-08 — End: 2015-09-11
  Administered 2015-09-10: 1 via TOPICAL
  Filled 2015-09-08: qty 120

## 2015-09-08 MED ORDER — ONDANSETRON HCL 4 MG/2ML IJ SOLN
INTRAMUSCULAR | Status: DC | PRN
  Administered 2015-09-08: 4 mg via INTRAVENOUS

## 2015-09-08 MED ORDER — KETOROLAC TROMETHAMINE 30 MG/ML IJ SOLN: 30.0000 mg | Freq: Four times a day (QID) | INTRAMUSCULAR | Status: AC | PRN

## 2015-09-08 MED ORDER — DIPHENHYDRAMINE HCL 25 MG PO CAPS
25.0000 mg | ORAL_CAPSULE | Freq: Four times a day (QID) | ORAL | Status: DC | PRN
  Filled 2015-09-08 (×2): qty 1

## 2015-09-08 MED ORDER — DEXAMETHASONE SODIUM PHOSPHATE 10 MG/ML IJ SOLN
10.0000 mg | Freq: Once | INTRAMUSCULAR | Status: DC
  Filled 2015-09-08: qty 1

## 2015-09-08 MED ORDER — SIMETHICONE 80 MG PO CHEW
80.0000 mg | CHEWABLE_TABLET | Freq: Three times a day (TID) | ORAL | Status: DC
  Administered 2015-09-08 – 2015-09-11 (×7): 80 mg via ORAL
  Filled 2015-09-08 (×9): qty 1

## 2015-09-08 MED ORDER — KETOROLAC TROMETHAMINE 30 MG/ML IJ SOLN
30.0000 mg | Freq: Four times a day (QID) | INTRAMUSCULAR | Status: AC | PRN
  Administered 2015-09-08 – 2015-09-09 (×3): 30 mg via INTRAVENOUS
  Filled 2015-09-08 (×3): qty 1

## 2015-09-08 MED ORDER — IBUPROFEN 600 MG PO TABS
600.0000 mg | ORAL_TABLET | Freq: Four times a day (QID) | ORAL | Status: DC | PRN
  Filled 2015-09-08 (×4): qty 1

## 2015-09-08 MED ORDER — OXYCODONE HCL 5 MG PO TABS
5.0000 mg | ORAL_TABLET | ORAL | Status: DC | PRN
  Administered 2015-09-09 – 2015-09-10 (×4): 5 mg via ORAL
  Filled 2015-09-08 (×4): qty 1

## 2015-09-08 MED ORDER — SCOPOLAMINE 1 MG/3DAYS TD PT72: 1.0000 | MEDICATED_PATCH | Freq: Once | TRANSDERMAL | Status: DC

## 2015-09-08 MED ORDER — BUPIVACAINE 0.25 % ON-Q PUMP SINGLE CATH 400 ML: 400.0000 mL | INJECTION | Status: DC

## 2015-09-08 MED ORDER — SENNOSIDES-DOCUSATE SODIUM 8.6-50 MG PO TABS
2.0000 | ORAL_TABLET | ORAL | Status: DC
  Administered 2015-09-09 – 2015-09-10 (×2): 2 via ORAL
  Filled 2015-09-08 (×2): qty 2

## 2015-09-08 MED ORDER — ONDANSETRON HCL 4 MG/2ML IJ SOLN
INTRAMUSCULAR | Status: AC
  Administered 2015-09-08: 4 mg via INTRAVENOUS
  Filled 2015-09-08: qty 2

## 2015-09-08 MED ORDER — MEPERIDINE HCL 25 MG/ML IJ SOLN: 6.2500 mg | INTRAMUSCULAR | Status: DC | PRN

## 2015-09-08 MED ORDER — ZOLPIDEM TARTRATE 5 MG PO TABS: 5.0000 mg | ORAL_TABLET | Freq: Every evening | ORAL | Status: DC | PRN

## 2015-09-08 MED ORDER — OXYTOCIN 40 UNITS IN LACTATED RINGERS INFUSION - SIMPLE MED: 2.5000 [IU]/h | INTRAVENOUS | Status: AC

## 2015-09-08 MED ORDER — CITRIC ACID-SODIUM CITRATE 334-500 MG/5ML PO SOLN
30.0000 mL | ORAL | Status: AC
  Administered 2015-09-08: 30 mL via ORAL
  Filled 2015-09-08: qty 30

## 2015-09-08 MED ORDER — LACTATED RINGERS IV SOLN
INTRAVENOUS | Status: DC
  Administered 2015-09-08 – 2015-09-09 (×2): via INTRAVENOUS

## 2015-09-08 MED ORDER — NALOXONE HCL 0.4 MG/ML IJ SOLN: 0.4000 mg | INTRAMUSCULAR | Status: DC | PRN

## 2015-09-08 MED ORDER — PRENATAL MULTIVITAMIN CH
1.0000 | ORAL_TABLET | Freq: Every day | ORAL | Status: DC
  Administered 2015-09-09 – 2015-09-10 (×2): 1 via ORAL
  Filled 2015-09-08 (×2): qty 1

## 2015-09-08 MED ORDER — WITCH HAZEL-GLYCERIN EX PADS: 1.0000 "application " | MEDICATED_PAD | CUTANEOUS | Status: DC | PRN

## 2015-09-08 MED ORDER — ACETAMINOPHEN 325 MG PO TABS: 650.0000 mg | ORAL_TABLET | ORAL | Status: DC | PRN

## 2015-09-08 MED ORDER — ONDANSETRON HCL 4 MG/2ML IJ SOLN
4.0000 mg | Freq: Once | INTRAMUSCULAR | Status: AC
  Administered 2015-09-08: 4 mg via INTRAVENOUS

## 2015-09-08 MED ORDER — BUPIVACAINE ON-Q PAIN PUMP (FOR ORDER SET NO CHG)
INJECTION | Status: AC
Start: 2015-09-08 — End: 2015-09-11
  Filled 2015-09-08: qty 1

## 2015-09-08 MED ORDER — BUPIVACAINE HCL (PF) 0.5 % IJ SOLN
INTRAMUSCULAR | Status: AC
  Filled 2015-09-08: qty 30

## 2015-09-08 MED ORDER — LACTATED RINGERS IV SOLN
INTRAVENOUS | Status: DC
  Administered 2015-09-08 (×2): via INTRAVENOUS

## 2015-09-08 MED ORDER — CEFAZOLIN SODIUM-DEXTROSE 2-4 GM/100ML-% IV SOLN
2.0000 g | INTRAVENOUS | Status: AC
  Administered 2015-09-08: 2 g via INTRAVENOUS
  Filled 2015-09-08: qty 100

## 2015-09-08 MED ORDER — EPHEDRINE SULFATE 50 MG/ML IJ SOLN
INTRAMUSCULAR | Status: DC | PRN
  Administered 2015-09-08: 10 mg via INTRAVENOUS

## 2015-09-08 MED ORDER — IBUPROFEN 600 MG PO TABS
600.0000 mg | ORAL_TABLET | Freq: Four times a day (QID) | ORAL | Status: DC
  Administered 2015-09-09 – 2015-09-11 (×9): 600 mg via ORAL
  Filled 2015-09-08 (×5): qty 1

## 2015-09-08 SURGICAL SUPPLY — 23 items
BARRIER ADHS 3X4 INTERCEED (GAUZE/BANDAGES/DRESSINGS) ×3 IMPLANT
CANISTER SUCT 3000ML (MISCELLANEOUS) ×3 IMPLANT
CATH KIT ON-Q SILVERSOAK 5IN (CATHETERS) ×6 IMPLANT
CHLORAPREP W/TINT 26ML (MISCELLANEOUS) ×3 IMPLANT
DRSG TELFA 3X8 NADH (GAUZE/BANDAGES/DRESSINGS) ×3 IMPLANT
ELECT CAUTERY BLADE 6.4 (BLADE) ×3 IMPLANT
ELECT REM PT RETURN 9FT ADLT (ELECTROSURGICAL) ×3
ELECTRODE REM PT RTRN 9FT ADLT (ELECTROSURGICAL) ×1 IMPLANT
GAUZE SPONGE 4X4 12PLY STRL (GAUZE/BANDAGES/DRESSINGS) ×3 IMPLANT
GLOVE BIO SURGEON STRL SZ8 (GLOVE) ×12 IMPLANT
GOWN STRL REUS W/ TWL LRG LVL3 (GOWN DISPOSABLE) ×2 IMPLANT
GOWN STRL REUS W/ TWL XL LVL3 (GOWN DISPOSABLE) ×1 IMPLANT
GOWN STRL REUS W/TWL LRG LVL3 (GOWN DISPOSABLE) ×4
GOWN STRL REUS W/TWL XL LVL3 (GOWN DISPOSABLE) ×2
NS IRRIG 1000ML POUR BTL (IV SOLUTION) ×3 IMPLANT
PACK C SECTION AR (MISCELLANEOUS) ×3 IMPLANT
PAD OB MATERNITY 4.3X12.25 (PERSONAL CARE ITEMS) ×3 IMPLANT
PAD PREP 24X41 OB/GYN DISP (PERSONAL CARE ITEMS) ×3 IMPLANT
STAPLER INSORB 30 2030 C-SECTI (MISCELLANEOUS) ×3 IMPLANT
STRAP SAFETY BODY (MISCELLANEOUS) ×3 IMPLANT
SUT CHROMIC 1 CTX 36 (SUTURE) ×9 IMPLANT
SUT PLAIN GUT 0 (SUTURE) ×9 IMPLANT
SUT VIC AB 0 CT1 36 (SUTURE) ×9 IMPLANT

## 2015-09-08 NOTE — Plan of Care (Signed)
[id number] ready for repeat c/section with bilateral tubal ligation. Pt have iv infusing, second bag up.  Bicitra and Ancef 2 gram at bedside to be given prior to surgery. Pt has no questions at this time. Mother of patient at bedside and will be accompanying pt to OR.  Ellison Carwin Marius Betts RNC

## 2015-09-08 NOTE — Anesthesia Preprocedure Evaluation (Signed)
Anesthesia Evaluation  Patient identified by MRN, date of birth, ID band Patient awake    Reviewed: Allergy & Precautions, H&P , NPO status , Patient's Chart, lab work & pertinent test results, reviewed documented beta blocker date and time   Airway Mallampati: II  TM Distance: >3 FB Neck ROM: full    Dental no notable dental hx. (+) Teeth Intact   Pulmonary neg pulmonary ROS, neg shortness of breath, asthma ,    Pulmonary exam normal breath sounds clear to auscultation       Cardiovascular Exercise Tolerance: Good negative cardio ROS Normal cardiovascular exam Rhythm:regular Rate:Normal     Neuro/Psych Seizures -, Well Controlled,  PSYCHIATRIC DISORDERS negative neurological ROS  negative psych ROS   GI/Hepatic negative GI ROS, Neg liver ROS, GERD  ,  Endo/Other  negative endocrine ROS  Renal/GU negative Renal ROS  negative genitourinary   Musculoskeletal   Abdominal   Peds  Hematology negative hematology ROS (+)   Anesthesia Other Findings   Reproductive/Obstetrics (+) Pregnancy                             Anesthesia Physical Anesthesia Plan  ASA: II  Anesthesia Plan: Regional and Spinal   Post-op Pain Management:    Induction:   Airway Management Planned:   Additional Equipment:   Intra-op Plan:   Post-operative Plan:   Informed Consent: I have reviewed the patients History and Physical, chart, labs and discussed the procedure including the risks, benefits and alternatives for the proposed anesthesia with the patient or authorized representative who has indicated his/her understanding and acceptance.     Plan Discussed with: CRNA  Anesthesia Plan Comments:         Anesthesia Quick Evaluation

## 2015-09-08 NOTE — Brief Op Note (Signed)
09/08/2015  9:01 AM  PATIENT:  Erin [name]  25 y.o. female  PRE-OPERATIVE DIAGNOSIS:  elective repeat, desires sterilization  POST-OPERATIVE DIAGNOSIS:  elective repeat, desires sterilization  PROCEDURE:  Procedure(s): CESAREAN SECTION WITH BILATERAL TUBAL LIGATION (Bilateral) On Q pump placement  SURGEON:  Surgeon(s) and Role:    Suzy Bouchard* Thomas J Schermerhorn, MD - Primary  PHYSICIAN ASSISTANT:   ASSISTANTS: scrub tech    ANESTHESIA:   spinal  EBL:  Total I/O In: 1000 [I.V.:1000] Out: 620 [Urine:20; Blood:600]  BLOOD ADMINISTERED:none  DRAINS: Urinary Catheter (Foley)   LOCAL MEDICATIONS USED:  MARCAINE     SPECIMEN:  Source of Specimen:  portion each fallopian tube  DISPOSITION OF SPECIMEN:  PATHOLOGY  COUNTS:  YES  TOURNIQUET:  * No tourniquets in log *  DICTATION: .Other Dictation: Dictation Number verbal  PLAN OF CARE: Admit to inpatient   PATIENT DISPOSITION:  PACU - hemodynamically stable.   Delay start of Pharmacological VTE agent (>24hrs) due to surgical blood loss or risk of bleeding: not applicable

## 2015-09-08 NOTE — Plan of Care (Signed)
Pericare completed. Pt moving all four extremities. No pain at this time. Pt ready for transfer via bed to 339 in stable condition. Report to next RN   Ellison CarwinC Christie Viscomi RNC

## 2015-09-08 NOTE — Progress Notes (Signed)
Patient ID: Erin [name], female   DOB: 08/30/1990, 25 y.o.   MRN: [id number] Pt is ready for repeat LTCS + BTL + on q pump  All questions answered . Ready to proceed .

## 2015-09-08 NOTE — Anesthesia Procedure Notes (Signed)
Spinal  End time: 09/08/2015 7:45 AM Staffing Anesthesiologist: Yevette EdwardsADAMS, Erin G Resident/CRNA: Omer JackWEATHERLY, Erin Chasen Performed by: anesthesiologist  Preanesthetic Checklist Completed: patient identified, site marked, surgical consent, pre-op evaluation, timeout performed, IV checked, risks and benefits discussed and monitors and equipment checked Spinal Block Patient position: sitting Prep: Betadine Patient monitoring: heart rate, continuous pulse ox and blood pressure Approach: midline Location: L3-4 Needle Needle type: Whitacre  Needle gauge: 24 G Needle length: 9 cm Assessment Sensory level: T6

## 2015-09-08 NOTE — Transfer of Care (Signed)
Immediate Anesthesia Transfer of Care Note  Patient: Erin [name]  Procedure(s) Performed: Procedure(s): CESAREAN SECTION WITH BILATERAL TUBAL LIGATION (Bilateral)  Patient Location: PACU and Mother/Baby  Anesthesia Type:Spinal  Level of Consciousness: awake, alert  and oriented  Airway & Oxygen Therapy: Patient Spontanous Breathing  Post-op Assessment: Report given to RN and Post -op Vital signs reviewed and stable  Post vital signs: Reviewed and stable  Last Vitals:  Filed Vitals:   09/08/15 0555 09/08/15 0851  BP: 118/73 95/50  Pulse: 98 77  Temp: 36.7 C 37.2 C  Resp: 18 22    Last Pain: There were no vitals filed for this visit.       Complications: No apparent anesthesia complications

## 2015-09-09 LAB — CBC
HCT: 34.3 % — ABNORMAL LOW (ref 35.0–47.0)
Hemoglobin: 11.6 g/dL — ABNORMAL LOW (ref 12.0–16.0)
MCH: 31.1 pg (ref 26.0–34.0)
MCHC: 34 g/dL (ref 32.0–36.0)
MCV: 91.4 fL (ref 80.0–100.0)
Platelets: 174 10*3/uL (ref 150–440)
RBC: 3.75 MIL/uL — ABNORMAL LOW (ref 3.80–5.20)
RDW: 15.2 % — AB (ref 11.5–14.5)
WBC: 9.7 10*3/uL (ref 3.6–11.0)

## 2015-09-09 NOTE — Anesthesia Post-op Follow-up Note (Signed)
  Anesthesia Pain Follow-up Note  Patient: Erin [name]  Day #: 1  Date of Follow-up: 09/09/2015 Time: 3:32 PM  Last Vitals:  Filed Vitals:   09/09/15 0717 09/09/15 1152  BP: 113/57 117/63  Pulse: 88 67  Temp: 36.6 C 37 C  Resp: 18     Level of Consciousness: alert  Pain: none   Side Effects:None  Catheter Site Exam:clean, dry  Plan: D/C from anesthesia care  [facility] Labrea [place]

## 2015-09-09 NOTE — Progress Notes (Signed)
Post Partum Day 1/POD#1 LTCS with BTL Subjective: no complaints, up ad lib, voiding and tolerating PO  Objective: Blood pressure 113/57, pulse 88, temperature 97.8 F (36.6 C), temperature source Oral, resp. rate 18, last menstrual period 12/15/2014, SpO2 99 %, unknown if currently breastfeeding.  Physical Exam:  General: alert, cooperative and appears stated age  Lungs: CTA bilat, no W/RR Heart: S1S2, RRR, No M/R/G.  Lochia: appropriate Uterine Fundus: firm Incision: healing well, Dressing dry and intact DVT Evaluation: No evidence of DVT seen on physical exam.   Recent Labs  09/07/15 1028 09/09/15 0546  HGB 12.8 11.6*  HCT 37.0 34.3*    Assessment/Plan: 1. LTCS/BTL stable Breastfeeding  Continue OnQ and pain meds. DC on Monday.     LOS: 1 day   Sharee PimpleCaron W Myli Pae 09/09/2015, 10:26 AM

## 2015-09-09 NOTE — Discharge Summary (Deleted)
Obstetric Discharge Summary Reason for Admission: cesarean section Prenatal Procedures: ultrasound Intrapartum Procedures: cesarean: low cervical, transverse Postpartum Procedures: none Complications-Operative and Postpartum: none HEMOGLOBIN  Date Value Ref Range Status  09/09/2015 11.6* 12.0 - 16.0 g/dL Final   HGB  Date Value Ref Range Status  08/18/2012 12.3 12.0-16.0 g/dL Final   HCT  Date Value Ref Range Status  09/09/2015 34.3* 35.0 - 47.0 % Final  08/20/2012 36.3 35.0-47.0 % Final    Physical Exam:  General: alert, cooperative and appears stated age 50Lochia: appropriate Uterine Fundus: firm Incision: healing well DVT Evaluation: No evidence of DVT seen on physical exam.  Discharge Diagnoses: Term Pregnancy-delivered/LTCS/BTL  Discharge Information: Date: 09/11/2015 Activity: pelvic rest Diet: Regular Medications: PNV, Ibuprofen, Iron and Percocet Condition: stable Instructions: Pelvic rest, no driving x 2 weeks, take Fe Discharge to: home Follow-up Information    Schedule an appointment as soon as possible for a visit in 1 week to follow up.      Follow up with Jennell CornerSCHERMERHORN,THOMAS, MD.   Specialty:  Obstetrics and Gynecology   Contact information:   [address] [facility] [place] [address] [phone]       Follow up with Jennell CornerSCHERMERHORN,THOMAS, MD In 2 weeks.   Specialty:  Obstetrics and Gynecology   Why:  For wound re-check   Contact information:   [address] [facility] [place] [address] [phone]       Newborn Data: Live born female  Birth Weight: 7 lb 14 oz (3572 g) APGAR: 8, 9  Home with mother  Erin [name] 09/11/2015, 7:49 AM2

## 2015-09-09 NOTE — Anesthesia Postprocedure Evaluation (Signed)
Anesthesia Post Note  Patient: Erin [name]  Procedure(s) Performed: Procedure(s) (LRB): CESAREAN SECTION WITH BILATERAL TUBAL LIGATION (Bilateral)  Patient location during evaluation: Mother Baby Anesthesia Type: Spinal Level of consciousness: awake and alert and oriented Pain management: pain level controlled Vital Signs Assessment: post-procedure vital signs reviewed and stable Respiratory status: spontaneous breathing, nonlabored ventilation and respiratory function stable Cardiovascular status: blood pressure returned to baseline Postop Assessment: no headache Anesthetic complications: no    Last Vitals:  Filed Vitals:   09/09/15 0717 09/09/15 1152  BP: 113/57 117/63  Pulse: 88 67  Temp: 36.6 C 37 C  Resp: 18     Last Pain:  Filed Vitals:   09/09/15 1209  PainSc: 0-No pain                 Ginger CarneStephanie Cyla Haluska

## 2015-09-10 NOTE — Progress Notes (Signed)
Post Partum Day 2/POD#2 LTCS/BTL Subjective: no complaints, up ad lib, voiding and tolerating PO  Objective: Blood pressure 112/65, pulse 91, temperature 98.2 F (36.8 C), temperature source Oral, resp. rate 18, last menstrual period 12/15/2014, SpO2 99 %, unknown if currently breastfeeding.  Physical Exam:  General: alert, cooperative and appears stated age  Lungs: CTA bilat, no W/R/R. Heart: S1S2, RRR, No M/R/G. Lochia: appropriate, no clots Uterine Fundus: firm Incision: healing well DVT Evaluation: No evidence of DVT seen on physical exam. Nursing infant well.  Recent Labs  09/07/15 1028 09/09/15 0546  HGB 12.8 11.6*  HCT 37.0 34.3*    Assessment/Plan: Stable POD#2 Lactation Plan for discharge tomorrow     LOS: 2 days   Sharee Pimplearon W Jones 09/10/2015, 9:00 AM

## 2015-09-11 LAB — SURGICAL PATHOLOGY

## 2015-09-11 MED ORDER — OXYCODONE HCL 5 MG PO TABS: 5.0000 mg | ORAL_TABLET | ORAL | Status: DC | PRN

## 2015-09-11 MED ORDER — IBUPROFEN 600 MG PO TABS: 600.0000 mg | ORAL_TABLET | Freq: Four times a day (QID) | ORAL | Status: DC | PRN

## 2015-09-11 MED ORDER — DOCUSATE SODIUM 100 MG PO CAPS: 100.0000 mg | ORAL_CAPSULE | Freq: Two times a day (BID) | ORAL | Status: DC

## 2015-09-11 NOTE — Op Note (Signed)
NAME:  Aronoff, Libra                  ACCOUNT NO.:  MEDICAL RECORD NO.:  [id number]  LOCATION:                                 FACILITY:  PHYSICIAN:  Jennell Corner, MDDATE OF BIRTH:  [birth year]  DATE OF PROCEDURE: DATE OF DISCHARGE:                              OPERATIVE REPORT   PREOPERATIVE DIAGNOSIS: 1. Elective repeat cesarean section. 2. Elective permanent sterilization. 3. 39+ 2 weeks estimated gestational age.  POSTOPERATIVE DIAGNOSIS: 1. Elective repeat cesarean section. 2. Elective permanent sterilization. 3. 39+ 2 weeks estimated gestational age.  PROCEDURE PERFORMED: 1. Repeat low transverse cesarean section. 2. Bilateral tubal ligation, [place]. 3. On-Q pump placement.  SURGEON:  Jennell Corner, MD  ANESTHESIA:  Spinal.  SURGEON:  Jennell Corner, MD  FIRST ASSISTANT:  Scrub tech.  INDICATIONS:  A 25 year old, gravida 3, para 1, with EDC of Sep 13, 2015, patient is at 39+ 2 weeks.  The patient has had a prior cesarean section, elects for repeat cesarean section and elective permanent sterilization.  DESCRIPTION OF PROCEDURE:  After adequate spinal anesthesia, patient was placed in dorsal supine position.  Hip roll was placed under the right side.  The patient's abdomen was prepped and draped in normal sterile fashion.  She did receive 2 g IV Ancef prior to commencement of the case for prophylaxis.  Time-out was performed.  A Pfannenstiel incision was made 2 fingerbreadths above the symphysis pubis.  Sharp dissection was used to identify the fascia.  Fascia was opened in the midline and opened in a transverse fashion.  The superior aspect of the fascia was grasped with Kocher clamps and the recti muscles were dissected free. The inferior aspect of the fascia was grasped with Kocher clamps, and pyramidalis muscles dissected free.  Entry into the peritoneal cavity was accomplished sharply.  There were 2 thick adhesive bands from  the uterus to the anterior abdominal wall.  These were both doubly clamped, transected, and suture ligated with 0 Vicryl suture to free the uterus from the abdominal wall.  Bladder flap was created.  A low transverse uterine incision was made upon entry into the endometrial cavity.  Clear fluid resulted.  The uterine incision was extended with blunt transverse traction.  Fetal head was brought to the incision and vacuum was applied to the occiput with 1 gentle pull, the head was delivered and the vacuum was removed.  Shoulders and body were delivered without difficulty. Vigorous female was placed on the abdomen and dried for the 1st 60 seconds before the cord was clamped.  The infant was then passed to nursery staff to assign Apgar scores of 8 and 9.  Fetal weight 3580, viable female.  Placenta was manually delivered and the uterus was exteriorized. Endometrial cavity was wiped clean with a laparotomy tape and the cervix was opened with a ring forceps.  The uterine incision was closed with a running 1 chromic suture in locking fashion.  Good hemostasis was noted. Attention was directed to the patient's right fallopian tube which was grasped in the midportion.  Two separate 0 plain gut sutures were placed and a 1.5 cm portion of fallopian tube was removed.  Similar procedure was  repeated on the patient's left fallopian tube after placing 2 separate 0 plain gut sutures.  A 1.5 cm portion of fallopian tube was removed.  Good hemostasis was noted bilaterally.  Posterior cul-de-sac was irrigated and suctioned.  The uterus was then placed back into the abdominal cavity.  Paracolic gutters were wiped clean with laparotomy tapes.  Tubal ligation sites appeared hemostatic as well as the uterine incision.  Interceed was placed over the uterine incision in a T-shaped fashion.  The superior aspect of the fascia was then grasped with Kocher clamps, and the On-Q pump needles were placed from  infraumbilical position to a subfascial position.  The catheter was then tucked underneath the fascia.  Fascia was then closed over top these catheters with 0 Vicryl suture in a running, nonlocking fashion, good approximation of the edges.  Subcutaneous tissues were irrigated and bovied for hemostasis and the skin was then reapproximated with Insorb absorbable sutures, good cosmetic effect resulted.  Good hemostasis noted.  The On-Q pump catheters were secured at the skin level with LiquiBand and with Steri-Strips to the skin and each catheter was loaded with 5 mL of 0.5% Marcaine.  There were no complications.  ESTIMATED BLOOD LOSS:  600 mL.  INTRAOPERATIVE FLUIDS:  1400 mL.  Patient was taken to recovery room in good condition.          ______________________________ Jennell Cornerhomas Schermerhorn, MD     TS/MEDQ  D:  09/08/2015  T:  09/09/2015  Job:  [id number]

## 2015-09-11 NOTE — Discharge Instructions (Signed)
Cesarean Delivery, Care After °Refer to this sheet in the next few weeks. These instructions provide you with information on caring for yourself after your procedure. Your health care provider may also give you specific instructions. Your treatment has been planned according to current medical practices, but problems sometimes occur. Call your health care provider if you have any problems or questions after you go home. °HOME CARE INSTRUCTIONS  °· Only take over-the-counter or prescription medications as directed by your health care provider. °· Do not drink alcohol, especially if you are breastfeeding or taking medication to relieve pain. °· Do not chew or smoke tobacco. °· Continue to use good perineal care. Good perineal care includes: °¨ Wiping your perineum from front to back. °¨ Keeping your perineum clean. °· Check your surgical cut (incision) daily for increased redness, drainage, swelling, or separation of skin. °· Clean your incision gently with soap and water every day, and then pat it dry. If your health care provider says it is okay, leave the incision uncovered. Use a bandage (dressing) if the incision is draining fluid or appears irritated. If the adhesive strips across the incision do not fall off within 7 days, carefully peel them off. °· Hug a pillow when coughing or sneezing until your incision is healed. This helps to relieve pain. °· Do not use tampons or douche for the next 6 weeks.  °· Showers only, no tub baths for the next 6 weeks.  °· Wear a well-fitting bra that provides breast support. °· Limit wearing support panties or control-top hose. °· Drink enough fluids to keep your urine clear or pale yellow. °· Eat high-fiber foods such as whole grain cereals and breads, brown rice, beans, and fresh fruits and vegetables every day. These foods may help prevent or relieve constipation. °· Resume activities such as climbing stairs, driving, lifting, exercising, or traveling as directed by your  health care provider. °· No sexual intercourse for at least the next 6 weeks, and then not until you feel ready.  °· Try to have someone help you with your household activities and your newborn for at least a few days after you leave the hospital. °· Rest as much as possible. Try to rest or take a nap when your newborn is sleeping. °· Increase your activities gradually. °· Keep all of your scheduled postpartum appointments. It is very important to keep your scheduled follow-up appointments. At these appointments, your health care provider will be checking to make sure that you are healing physically and emotionally. °SEEK MEDICAL CARE IF:  °· You are passing large clots from your vagina. Save any clots to show your health care provider. °· You have a foul smelling discharge from your vagina. °· You have trouble urinating. °· You are urinating frequently. °· You have pain when you urinate. °· You have a change in your bowel movements. °· You have increasing redness, pain, or swelling near your incision. °· You have pus draining from your incision. °· Your incision is separating. °· You have painful, hard, or reddened breasts. °· You have a severe headache. °· You have blurred vision or see spots. °· You feel sad or depressed. °· You have thoughts of hurting yourself or your newborn. °· You have questions about your care, the care of your newborn, or medications. °· You are dizzy or light-headed. °· You have a rash. °· You have pain, redness, or swelling at the site of the removed intravenous access (IV) tube. °· You have nausea or   vomiting. °· You stopped breastfeeding and have not had a menstrual period within 12 weeks of stopping. °· You are not breastfeeding and have not had a menstrual period within 12 weeks of delivery. °· You have a fever. °SEEK IMMEDIATE MEDICAL CARE IF: °· You have persistent pain. °· You have chest pain. °· You have shortness of breath. °· You faint. °· You have leg pain. °· You have stomach  pain. °· Your vaginal bleeding saturates 2 or more sanitary pads in 1 hour. °MAKE SURE YOU:  °· Understand these instructions. °· Will watch your condition. °· Will get help right away if you are not doing well or get worse. °  °This information is not intended to replace advice given to you by your health care provider. Make sure you discuss any questions you have with your health care provider. °  °Document Released: 01/05/2002 Document Revised: 05/06/2014 Document Reviewed: 12/11/2011 °Elsevier Interactive Patient Education ©2016 [employer] Inc[employer] ° °

## 2015-09-11 NOTE — Discharge Summary (Signed)
Obstetric Discharge Summary Reason for Admission: cesarean section, + BTL Prenatal Procedures: none Intrapartum Procedures: cesarean: low cervical, transverse and tubal ligation Postpartum Procedures: none Complications-Operative and Postpartum: none HEMOGLOBIN  Date Value Ref Range Status  09/09/2015 11.6* 12.0 - 16.0 g/dL Final   HGB  Date Value Ref Range Status  08/18/2012 12.3 12.0-16.0 g/dL Final   HCT  Date Value Ref Range Status  09/09/2015 34.3* 35.0 - 47.0 % Final  08/20/2012 36.3 35.0-47.0 % Final    Physical Exam:  General: alert and cooperative Lochia: appropriate Uterine Fundus: firm Incision: healing well DVT Evaluation: No evidence of DVT seen on physical exam. Lungs CTA CV RRR  Discharge Diagnoses: Term Pregnancy-delivered  Discharge Information: Date: 09/11/2015 Activity: pelvic rest Diet: routine Medications: Ibuprofen, Colace and Percocet Condition: stable Instructions: refer to practice specific booklet Discharge to: home Follow-up Information    Schedule an appointment as soon as possible for a visit in 1 week to follow up.      Follow up with Jennell CornerSCHERMERHORN,Camary Sosa, MD.   Specialty:  Obstetrics and Gynecology   Contact information:   [address] [facility] [place] [address] [phone]       Follow up with Jennell CornerSCHERMERHORN,Merville Hijazi, MD In 2 weeks.   Specialty:  Obstetrics and Gynecology   Why:  For wound re-check   Contact information:   [address] [facility] [place] [address] [phone]       Newborn Data: Live born female  Birth Weight: 7 lb 14 oz (3572 g) APGAR: 8, 9  Home with mother.  Erin [name] 09/11/2015, 7:41 AM

## 2015-09-11 NOTE — Progress Notes (Signed)
Patient discharged home with infant. Vital signs stable, bleeding within normal limits, uterus firm. Discharge instructions, prescriptions, and follow up appointment given to and reviewed with patient. Patient verbalized understanding, all questions answered. Escorted in wheelchair by auxiliary.    

## 2016-03-29 ENCOUNTER — Other Ambulatory Visit: Payer: Self-pay | Admitting: Counselor

## 2016-03-29 DIAGNOSIS — R52 Pain, unspecified: Secondary | ICD-10-CM

## 2016-04-01 ENCOUNTER — Other Ambulatory Visit: Payer: Self-pay | Admitting: Counselor

## 2016-04-01 ENCOUNTER — Ambulatory Visit
Admission: RE | Admit: 2016-04-01 | Discharge: 2016-04-01 | Disposition: A | Discharge status: Home / Self Care | Payer: Worker's Compensation | Source: Ambulatory Visit | Attending: Counselor | Admitting: Counselor

## 2016-04-01 DIAGNOSIS — R52 Pain, unspecified: Secondary | ICD-10-CM

## 2016-04-01 DIAGNOSIS — M5442 Lumbago with sciatica, left side: Secondary | ICD-10-CM

## 2016-05-13 ENCOUNTER — Emergency Department
Admission: EM | Admit: 2016-05-13 | Discharge: 2016-05-13 | Disposition: A | Discharge status: Home / Self Care | Payer: Self-pay | Attending: Emergency Medicine | Admitting: Emergency Medicine

## 2016-05-13 ENCOUNTER — Encounter: Payer: Self-pay | Admitting: *Deleted

## 2016-05-13 DIAGNOSIS — Z79899 Other long term (current) drug therapy: Secondary | ICD-10-CM | POA: Insufficient documentation

## 2016-05-13 DIAGNOSIS — J111 Influenza due to unidentified influenza virus with other respiratory manifestations: Secondary | ICD-10-CM | POA: Insufficient documentation

## 2016-05-13 DIAGNOSIS — J45909 Unspecified asthma, uncomplicated: Secondary | ICD-10-CM | POA: Insufficient documentation

## 2016-05-13 LAB — INFLUENZA PANEL BY PCR (TYPE A & B)
INFLBPCR: NEGATIVE
Influenza A By PCR: POSITIVE — AB

## 2016-05-13 LAB — POCT PREGNANCY, URINE: PREG TEST UR: NEGATIVE

## 2016-05-13 MED ORDER — SODIUM CHLORIDE 0.9 % IV BOLUS (SEPSIS)
1000.0000 mL | Freq: Once | INTRAVENOUS | Status: AC
  Administered 2016-05-13: 1000 mL via INTRAVENOUS

## 2016-05-13 MED ORDER — ONDANSETRON HCL 4 MG/2ML IJ SOLN
INTRAMUSCULAR | Status: AC
  Administered 2016-05-13: 4 mg
  Filled 2016-05-13: qty 2

## 2016-05-13 MED ORDER — ONDANSETRON HCL 4 MG PO TABS
4.0000 mg | ORAL_TABLET | Freq: Once | ORAL | Status: AC
  Administered 2016-05-13: 4 mg via ORAL
  Filled 2016-05-13: qty 1

## 2016-05-13 MED ORDER — IBUPROFEN 400 MG PO TABS
400.0000 mg | ORAL_TABLET | Freq: Once | ORAL | Status: AC
  Administered 2016-05-13: 400 mg via ORAL

## 2016-05-13 MED ORDER — OSELTAMIVIR PHOSPHATE 75 MG PO CAPS: 75.0000 mg | ORAL_CAPSULE | Freq: Two times a day (BID) | ORAL | 0 refills | Status: AC

## 2016-05-13 NOTE — ED Provider Notes (Signed)
[facility] Emergency Department Provider Note  ____________________________________________  Time seen: Approximately 1:32 PM  I have reviewed the triage vital signs and the nursing notes.   HISTORY  Chief Complaint Fever and Generalized Body Aches    HPI Erin [name] is a 26 y.o. female presents to the emergency department with fever, body aches, cough since Saturday . Patient has been nauseous and hasn't been able to eat or drink very much. Patient states that fever was 100 on Saturday but fever spiked to 104 yesterday and patient could not bring fever down. Patient was taking ibuprofen and Tylenol for fever. Patient got flu shot this year.   Past Medical History:  Diagnosis Date  . Asthma   . Depression    postpartum depression in past  . GERD (gastroesophageal reflux disease)   . Seasonal allergies   . Seasonal allergies   . Seizures (HCC)    during childhood    Patient Active Problem List   Diagnosis Date Noted  . Post-operative state 09/08/2015  . Amniotic fluid leaking 08/12/2015  . Nausea and vomiting in pregnancy 07/05/2015  . Family history of Down syndrome 03/16/2015  . Hyperemesis affecting pregnancy, antepartum 03/10/2015  . Cough 03/10/2015  . Viral respiratory illness 03/10/2015    Past Surgical History:  Procedure Laterality Date  . CESAREAN SECTION  2014  . CESAREAN SECTION WITH BILATERAL TUBAL LIGATION Bilateral 09/08/2015   Procedure: CESAREAN SECTION WITH BILATERAL TUBAL LIGATION;  Surgeon: Suzy Bouchard, MD;  Location: [facility] ORS;  Service: Obstetrics;  Laterality: Bilateral;  . TONSILLECTOMY      Prior to Admission medications   Medication Sig Start Date End Date Taking? Authorizing Provider  albuterol (PROVENTIL HFA;VENTOLIN HFA) 108 (90 BASE) MCG/ACT inhaler Inhale 2 puffs into the lungs every 6 (six) hours as needed for wheezing or shortness of breath. 03/07/15   Anne-Caroline Sharma Covert, MD  docusate sodium  (COLACE) 100 MG capsule Take 1 capsule (100 mg total) by mouth 2 (two) times daily. 09/11/15   Ihor Austin Schermerhorn, MD  ibuprofen (ADVIL,MOTRIN) 600 MG tablet Take 1 tablet (600 mg total) by mouth every 6 (six) hours as needed for mild pain. 09/11/15   Ihor Austin Schermerhorn, MD  oseltamivir (TAMIFLU) 75 MG capsule Take 1 capsule (75 mg total) by mouth 2 (two) times daily. 05/13/16 05/23/16  Enid Derry, PA-C  oxyCODONE (OXY IR/ROXICODONE) 5 MG immediate release tablet Take 1 tablet (5 mg total) by mouth every 4 (four) hours as needed (pain scale 4-7). 09/11/15   Suzy Bouchard, MD  Prenatal Vit-Fe Fumarate-FA (PRENATAL MULTIVITAMIN) TABS Take 1 tablet by mouth daily.    Historical Provider, MD    Allergies Sulfa drugs cross reactors  Family History  Problem Relation Age of Onset  . Hypertension Mother   . Arthritis Mother   . Cancer Mother   . Diabetes Mother   . Cancer Father     Social History Social History  Substance Use Topics  . Smoking status: Never Smoker  . Smokeless tobacco: Never Used  . Alcohol use No     Review of Systems  Constitutional: No fever/chills Eyes: No visual changes. No discharge. ENT: Positive for congestion and rhinorrhea. Cardiovascular: No chest pain. Respiratory: Positive for cough. No SOB. Gastrointestinal: No abdominal pain. No diarrhea.  No constipation. Skin: Negative for rash, abrasions, lacerations, ecchymosis.  ____________________________________________   PHYSICAL EXAM:  VITAL SIGNS: ED Triage Vitals [05/13/16 1103]  Enc Vitals Group     BP Marland Kitchen)  145/86     Pulse Rate (!) 123     Resp 18     Temp (!) 101.3 F (38.5 C)     Temp Source Oral     SpO2 98 %     Weight 205 lb (93 kg)     Height 5\' 8"  (1.727 m)     Head Circumference      Peak Flow      Pain Score 8     Pain Loc      Pain Edu?      Excl. in GC?      Constitutional: Alert and oriented. Well appearing and in no acute distress. Eyes: Conjunctivae are  normal. PERRL. EOMI. No discharge. Head: Atraumatic. ENT: No frontal and maxillary sinus tenderness.      Ears: Tympanic membranes pearly gray with good landmarks. No discharge.      Nose: Mild congestion/rhinnorhea.      Mouth/Throat: Mucous membranes are moist. Oropharynx non-erythematous. Tonsils not enlarged. No exudates. Uvula midline. Neck: No stridor.   Hematological/Lymphatic/Immunilogical: No cervical lymphadenopathy. Cardiovascular: Tachycardic rate, regular rhythm. Normal S1 and S2.  Good peripheral circulation. Respiratory: Normal respiratory effort without tachypnea or retractions. Lungs CTAB. Good air entry to the bases with no decreased or absent breath sounds. Gastrointestinal: Bowel sounds 4 quadrants. Soft and nontender to palpation. No guarding or rigidity. No palpable masses. No distention. Musculoskeletal: Full range of motion to all extremities. No gross deformities appreciated. Neurologic:  Normal speech and language. No gross focal neurologic deficits are appreciated.  Skin:  Skin is warm, dry and intact. No rash noted. Psychiatric: Mood and affect are normal. Speech and behavior are normal. Patient exhibits appropriate insight and judgement.   ____________________________________________   LABS (all labs ordered are listed, but only abnormal results are displayed)  Labs Reviewed  INFLUENZA PANEL BY PCR (TYPE A & B) - Abnormal; Notable for the following:       Result Value   Influenza A By PCR POSITIVE (*)    All other components within normal limits  POC URINE PREG, ED  POCT PREGNANCY, URINE   ____________________________________________  EKG   ____________________________________________  RADIOLOGY   No results found.  ____________________________________________    PROCEDURES  Procedure(s) performed:    Procedures    Medications  ondansetron (ZOFRAN) tablet 4 mg (not administered)  ibuprofen (ADVIL,MOTRIN) tablet 400 mg (400 mg Oral  Given 05/13/16 1116)  sodium chloride 0.9 % bolus 1,000 mL (1,000 mLs Intravenous New Bag/Given 05/13/16 1254)  ondansetron (ZOFRAN) 4 MG/2ML injection (4 mg  Given 05/13/16 1317)     ____________________________________________   INITIAL IMPRESSION / ASSESSMENT AND PLAN / ED COURSE  Pertinent labs & imaging results that were available during my care of the patient were reviewed by me and considered in my medical decision making (see chart for details).  Review of the [place] CSRS was performed in accordance of the [facility] prior to dispensing any controlled drugs.  Clinical Course     Patient's diagnosis is consistent with Influenza. Saline bolus was given in ED since patient was tachycardic, and she has not been drinking fluids. Patient felt significantly better after bolus and heart rate came down. Patients fever came down and was 98.2 before leaving ED. Patient was ready and felt comfortable going home. Patient will be discharged home with prescriptions for Tamiflu. Patient is to follow up with PC as needed or otherwise directed. Patient is given ED precautions to return to the ED for any worsening  or new symptoms.  ____________________________________________  FINAL CLINICAL IMPRESSION(S) / ED DIAGNOSES  Final diagnoses:  Influenza      NEW MEDICATIONS STARTED DURING THIS VISIT:  New Prescriptions   OSELTAMIVIR (TAMIFLU) 75 MG CAPSULE    Take 1 capsule (75 mg total) by mouth 2 (two) times daily.        This chart was dictated using voice recognition software/Dragon. Despite best efforts to proofread, errors can occur which can change the meaning. Any change was purely unintentional.    Enid Derry, PA-C 05/13/16 1603    Governor Rooks, MD 05/15/16 [phone]

## 2016-05-13 NOTE — ED Triage Notes (Signed)
States fever, bodyaches and flu like symptoms since Saturday, states she has been rotating motrin and tylenol but can not get fever down, last took tylenol at 9 am

## 2016-05-13 NOTE — ED Notes (Signed)
See triage note  Developed fever with body aches and cough since sat states she is not able to bear fever at home

## 2016-05-24 ENCOUNTER — Emergency Department
Admission: EM | Admit: 2016-05-24 | Discharge: 2016-05-25 | Disposition: A | Discharge status: Home / Self Care | Payer: Self-pay | Attending: Emergency Medicine | Admitting: Emergency Medicine

## 2016-05-24 ENCOUNTER — Encounter: Payer: Self-pay | Admitting: Emergency Medicine

## 2016-05-24 ENCOUNTER — Emergency Department: Payer: Self-pay

## 2016-05-24 DIAGNOSIS — Z791 Long term (current) use of non-steroidal anti-inflammatories (NSAID): Secondary | ICD-10-CM | POA: Insufficient documentation

## 2016-05-24 DIAGNOSIS — R05 Cough: Secondary | ICD-10-CM

## 2016-05-24 DIAGNOSIS — R059 Cough, unspecified: Secondary | ICD-10-CM

## 2016-05-24 DIAGNOSIS — Z79899 Other long term (current) drug therapy: Secondary | ICD-10-CM | POA: Insufficient documentation

## 2016-05-24 DIAGNOSIS — J45909 Unspecified asthma, uncomplicated: Secondary | ICD-10-CM | POA: Insufficient documentation

## 2016-05-24 DIAGNOSIS — J209 Acute bronchitis, unspecified: Secondary | ICD-10-CM | POA: Insufficient documentation

## 2016-05-24 MED ORDER — BENZONATATE 100 MG PO CAPS
200.0000 mg | ORAL_CAPSULE | Freq: Once | ORAL | Status: AC
  Administered 2016-05-24: 200 mg via ORAL
  Filled 2016-05-24: qty 2

## 2016-05-24 NOTE — ED Triage Notes (Signed)
Pt states that she was dx with flu x2 weeks ago but now has a dry cough that will not go away. Pt is ambulatory to triage with NAD noted at this time.

## 2016-05-25 MED ORDER — HYDROCOD POLST-CPM POLST ER 10-8 MG/5ML PO SUER: 5.0000 mL | Freq: Every evening | ORAL | 0 refills | Status: DC | PRN

## 2016-05-25 MED ORDER — AZITHROMYCIN 250 MG PO TABS: ORAL_TABLET | ORAL | 0 refills | Status: DC

## 2016-05-25 MED ORDER — BENZONATATE 100 MG PO CAPS: 200.0000 mg | ORAL_CAPSULE | Freq: Three times a day (TID) | ORAL | 0 refills | Status: DC | PRN

## 2016-05-25 MED ORDER — PREDNISONE 20 MG PO TABS
40.0000 mg | ORAL_TABLET | Freq: Once | ORAL | Status: AC
  Administered 2016-05-25: 40 mg via ORAL
  Filled 2016-05-25: qty 2

## 2016-05-25 MED ORDER — PREDNISONE 20 MG PO TABS: 40.0000 mg | ORAL_TABLET | Freq: Every day | ORAL | 0 refills | Status: DC

## 2016-05-25 NOTE — ED Provider Notes (Signed)
[facility]-EMERGENCY DEPARTMENT Provider Note   CSN: [id number] Arrival date & time: 05/24/16  2218     History   Chief Complaint Chief Complaint  Patient presents with  . Cough    HPI Erin [name] is a 26 y.o. female presents to the urgent care for evaluation of cough. Patient was diagnosed with the flu 2 weeks ago, had persistent cough that has increased over the last few days. She denies any fevers. Cough has been dry. Cough is severe the point where she feels as if she is going to vomit. She denies any chest pain, has mild shortness of breath during coughing episodes. No abdominal pain nausea vomiting or diarrhea. No skin rashes. She's taken over-the-counter Robitussin with no improvement. She has difficulty sleeping at night due to persistent cough.  HPI  Past Medical History:  Diagnosis Date  . Asthma   . Depression    postpartum depression in past  . GERD (gastroesophageal reflux disease)   . Seasonal allergies   . Seasonal allergies   . Seizures (HCC)    during childhood    Patient Active Problem List   Diagnosis Date Noted  . Post-operative state 09/08/2015  . Amniotic fluid leaking 08/12/2015  . Nausea and vomiting in pregnancy 07/05/2015  . Family history of Down syndrome 03/16/2015  . Hyperemesis affecting pregnancy, antepartum 03/10/2015  . Cough 03/10/2015  . Viral respiratory illness 03/10/2015    Past Surgical History:  Procedure Laterality Date  . CESAREAN SECTION  2014  . CESAREAN SECTION WITH BILATERAL TUBAL LIGATION Bilateral 09/08/2015   Procedure: CESAREAN SECTION WITH BILATERAL TUBAL LIGATION;  Surgeon: Suzy Bouchard, MD;  Location: [facility] ORS;  Service: Obstetrics;  Laterality: Bilateral;  . TONSILLECTOMY      OB History    Gravida Para Term Preterm AB Living   3 2 2  0 1 2   SAB TAB Ectopic Multiple Live Births   1 0 0 0 2       Home Medications    Prior to Admission medications   Medication Sig Start Date End Date Taking?  Authorizing Provider  albuterol (PROVENTIL HFA;VENTOLIN HFA) 108 (90 BASE) MCG/ACT inhaler Inhale 2 puffs into the lungs every 6 (six) hours as needed for wheezing or shortness of breath. 03/07/15   Anne-Caroline Sharma Covert, MD  benzonatate (TESSALON PERLES) 100 MG capsule Take 2 capsules (200 mg total) by mouth 3 (three) times daily as needed for cough. 05/25/16 05/25/17  Evon Slack, PA-C  chlorpheniramine-HYDROcodone [facility]) 10-8 MG/5ML SUER Take 5 mLs by mouth at bedtime as needed for cough. 05/25/16   Evon Slack, PA-C  docusate sodium (COLACE) 100 MG capsule Take 1 capsule (100 mg total) by mouth 2 (two) times daily. 09/11/15   Ihor Austin Schermerhorn, MD  ibuprofen (ADVIL,MOTRIN) 600 MG tablet Take 1 tablet (600 mg total) by mouth every 6 (six) hours as needed for mild pain. 09/11/15   Ihor Austin Schermerhorn, MD  oxyCODONE (OXY IR/ROXICODONE) 5 MG immediate release tablet Take 1 tablet (5 mg total) by mouth every 4 (four) hours as needed (pain scale 4-7). 09/11/15   Ihor Austin Schermerhorn, MD  predniSONE (DELTASONE) 20 MG tablet Take 2 tablets (40 mg total) by mouth daily. 05/25/16   Evon Slack, PA-C  Prenatal Vit-Fe Fumarate-FA (PRENATAL MULTIVITAMIN) TABS Take 1 tablet by mouth daily.    Historical Provider, MD    Family History Family History  Problem Relation Age of Onset  . Hypertension Mother   .  Arthritis Mother   . Cancer Mother   . Diabetes Mother   . Cancer Father     Social History Social History  Substance Use Topics  . Smoking status: Never Smoker  . Smokeless tobacco: Never Used  . Alcohol use No     Allergies   Sulfa drugs cross reactors   Review of Systems Review of Systems  Constitutional: Negative for activity change, chills, fatigue and fever.  HENT: Negative for congestion, sinus pressure and sore throat.   Eyes: Negative for visual disturbance.  Respiratory: Positive for cough. Negative for chest tightness, shortness of breath,  wheezing and stridor.   Cardiovascular: Negative for chest pain and leg swelling.  Gastrointestinal: Negative for abdominal pain, diarrhea, nausea and vomiting.  Genitourinary: Negative for dysuria.  Musculoskeletal: Negative for arthralgias and gait problem.  Skin: Negative for rash.  Neurological: Negative for weakness, numbness and headaches.  Hematological: Negative for adenopathy.  Psychiatric/Behavioral: Negative for agitation, behavioral problems and confusion.     Physical Exam Updated Vital Signs BP 136/69 (BP Location: Right Arm)   Pulse 88   Temp 98.6 F (37 C) (Oral)   Resp 18   Ht 5\' 8"  (1.727 m)   Wt 93 kg   SpO2 100%   Breastfeeding? Yes   BMI 31.17 kg/m   Physical Exam  Constitutional: She appears well-developed and well-nourished. No distress.  HENT:  Head: Normocephalic and atraumatic.  Right Ear: External ear normal.  Left Ear: External ear normal.  Nose: Nose normal.  Mouth/Throat: Oropharynx is clear and moist.  Eyes: Conjunctivae are normal.  Neck: Normal range of motion. Neck supple.  Cardiovascular: Normal rate and regular rhythm.   No murmur heard. Pulmonary/Chest: Effort normal and breath sounds normal. No respiratory distress. She has no wheezes. She has no rales. She exhibits no tenderness.  Abdominal: Soft. She exhibits no mass. There is no tenderness. There is no guarding.  Musculoskeletal: She exhibits no edema.  Lymphadenopathy:    She has no cervical adenopathy.  Neurological: She is alert.  Skin: Skin is warm and dry.  Psychiatric: She has a normal mood and affect.  Nursing note and vitals reviewed.    ED Treatments / Results  Labs (all labs ordered are listed, but only abnormal results are displayed) Labs Reviewed - No data to display  EKG  EKG Interpretation None       Radiology Dg Chest 2 View  Result Date: 05/24/2016 CLINICAL DATA:  Subacute onset of fever and cough. Initial encounter. EXAM: CHEST  2 VIEW  COMPARISON:  Chest radiograph performed 03/10/2015 FINDINGS: The lungs are well-aerated and clear. There is no evidence of focal opacification, pleural effusion or pneumothorax. The heart is normal in size; the mediastinal contour is within normal limits. No acute osseous abnormalities are seen. IMPRESSION: No acute cardiopulmonary process seen. Electronically Signed   By: Roanna Raider M.D.   On: 05/24/2016 23:48    Procedures Procedures (including critical care time)  Medications Ordered in ED Medications  predniSONE (DELTASONE) tablet 40 mg (not administered)  benzonatate (TESSALON) capsule 200 mg (200 mg Oral Given 05/24/16 2338)     Initial Impression / Assessment and Plan / ED Course  I have reviewed the triage vital signs and the nursing notes.  Pertinent labs & imaging results that were available during my care of the patient were reviewed by me and considered in my medical decision making (see chart for details).     26 year old female with worsening cough. Diagnosed  with influenza 2 weeks ago, most symptoms have resolved except for cough. Cough was improving up to the last few days cough has become more severe cough is dry nonproductive. No fevers. Patient diagnosed with bronchitis. She is given prescription for daytime and nighttime cough medication along with prednisone. She'll increase fluids and she is given information to return to the emergency department for.  Final Clinical Impressions(s) / ED Diagnoses   Final diagnoses:  Cough  Acute bronchitis, unspecified organism    New Prescriptions New Prescriptions   BENZONATATE (TESSALON PERLES) 100 MG CAPSULE    Take 2 capsules (200 mg total) by mouth 3 (three) times daily as needed for cough.   CHLORPHENIRAMINE-HYDROCODONE (TUSSIONEX PENNKINETIC ER) 10-8 MG/5ML SUER    Take 5 mLs by mouth at bedtime as needed for cough.   PREDNISONE (DELTASONE) 20 MG TABLET    Take 2 tablets (40 mg total) by mouth daily.     Evon Slackhomas C  Nicki Gracy, PA-C 05/25/16 [id number]    Emily FilbertJonathan E Williams, MD 05/25/16 [phone]

## 2016-05-25 NOTE — Discharge Instructions (Signed)
Please make sure you drinking lots of fluids. Take prednisone as prescribed. Take Tessalon Perles as prescribed during the day as needed for cough. Take Tussionex nighttime cough medicine at bedtime as needed for severe nighttime cough. Return to the ER for any fevers worsening symptoms urgent changes in her health.

## 2016-05-27 ENCOUNTER — Emergency Department: Payer: Self-pay

## 2016-05-27 ENCOUNTER — Encounter: Payer: Self-pay | Admitting: Emergency Medicine

## 2016-05-27 DIAGNOSIS — Z791 Long term (current) use of non-steroidal anti-inflammatories (NSAID): Secondary | ICD-10-CM | POA: Insufficient documentation

## 2016-05-27 DIAGNOSIS — J40 Bronchitis, not specified as acute or chronic: Secondary | ICD-10-CM | POA: Insufficient documentation

## 2016-05-27 DIAGNOSIS — Z79899 Other long term (current) drug therapy: Secondary | ICD-10-CM | POA: Insufficient documentation

## 2016-05-27 NOTE — ED Triage Notes (Signed)
Patient ambulatory to triage with steady gait, without difficulty or distress noted; pt dx with influenza 2wks ago; seen 1/26 and dx with bronchitis; rx tussionex and benzonatate; tonight was coughing and felt "pop" to right mid lateral ribcage; c/o pain since

## 2016-05-28 ENCOUNTER — Emergency Department
Admission: EM | Admit: 2016-05-28 | Discharge: 2016-05-28 | Disposition: A | Discharge status: Home / Self Care | Payer: Self-pay | Attending: Emergency Medicine | Admitting: Emergency Medicine

## 2016-05-28 DIAGNOSIS — R0789 Other chest pain: Secondary | ICD-10-CM

## 2016-05-28 DIAGNOSIS — J40 Bronchitis, not specified as acute or chronic: Secondary | ICD-10-CM

## 2016-05-28 DIAGNOSIS — R05 Cough: Secondary | ICD-10-CM

## 2016-05-28 DIAGNOSIS — R059 Cough, unspecified: Secondary | ICD-10-CM

## 2016-05-28 MED ORDER — PREDNISONE 20 MG PO TABS
60.0000 mg | ORAL_TABLET | Freq: Once | ORAL | Status: AC
  Administered 2016-05-28: 60 mg via ORAL
  Filled 2016-05-28: qty 3

## 2016-05-28 MED ORDER — TRAMADOL HCL 50 MG PO TABS: 50.0000 mg | ORAL_TABLET | Freq: Four times a day (QID) | ORAL | 0 refills | Status: DC | PRN

## 2016-05-28 MED ORDER — LIDOCAINE 5 % EX PTCH
1.0000 | MEDICATED_PATCH | CUTANEOUS | Status: DC
  Administered 2016-05-28: 1 via TRANSDERMAL
  Filled 2016-05-28: qty 1

## 2016-05-28 MED ORDER — IPRATROPIUM-ALBUTEROL 0.5-2.5 (3) MG/3ML IN SOLN
3.0000 mL | Freq: Once | RESPIRATORY_TRACT | Status: AC
  Administered 2016-05-28: 3 mL via RESPIRATORY_TRACT
  Filled 2016-05-28: qty 3

## 2016-05-28 MED ORDER — TRAMADOL HCL 50 MG PO TABS
50.0000 mg | ORAL_TABLET | Freq: Once | ORAL | Status: AC
  Administered 2016-05-28: 50 mg via ORAL
  Filled 2016-05-28: qty 1

## 2016-05-28 MED ORDER — LIDOCAINE 5 % EX PTCH: 1.0000 | MEDICATED_PATCH | Freq: Two times a day (BID) | CUTANEOUS | 0 refills | Status: DC

## 2016-05-28 NOTE — ED Notes (Signed)
Pt. States taking ibuprofen for pain w/o relief

## 2016-05-28 NOTE — ED Notes (Signed)

## 2016-05-28 NOTE — ED Provider Notes (Signed)
[facility] Emergency Department Provider Note   ____________________________________________   First MD Initiated Contact with Patient 05/28/16 [phone]     (approximate)  I have reviewed the triage vital signs and the nursing notes.   HISTORY  Chief Complaint Cough    HPI Erin [name] is a 26 y.o. female who comes into the hospital today with some right-sided chest pain. The patient reports that she has bronchitis and had a bad coughing spell last night. She reports that she went to put her son into bed and felt a pop on the right lateral side of her chest. She reports that she could not get up. She took some improvement for the pain and took some Tussionex and Tessalon Perles but it did not help. The patient denies any fevers or shortness of breath. She's been eating and drinking well. The patient rated pain at 8 out of 10 in intensity. She had the flu initially and then was diagnosed with bronchitis. She's been having symptoms on and off for the past month. The patient is here today for evaluation.   Past Medical History:  Diagnosis Date  . Asthma   . Depression    postpartum depression in past  . GERD (gastroesophageal reflux disease)   . Seasonal allergies   . Seasonal allergies   . Seizures (HCC)    during childhood    Patient Active Problem List   Diagnosis Date Noted  . Post-operative state 09/08/2015  . Amniotic fluid leaking 08/12/2015  . Nausea and vomiting in pregnancy 07/05/2015  . Family history of Down syndrome 03/16/2015  . Hyperemesis affecting pregnancy, antepartum 03/10/2015  . Cough 03/10/2015  . Viral respiratory illness 03/10/2015    Past Surgical History:  Procedure Laterality Date  . CESAREAN SECTION  2014  . CESAREAN SECTION WITH BILATERAL TUBAL LIGATION Bilateral 09/08/2015   Procedure: CESAREAN SECTION WITH BILATERAL TUBAL LIGATION;  Surgeon: Suzy Bouchardhomas J Schermerhorn, MD;  Location: [facility] ORS;  Service: Obstetrics;   Laterality: Bilateral;  . TONSILLECTOMY      Prior to Admission medications   Medication Sig Start Date End Date Taking? Authorizing Provider  albuterol (PROVENTIL HFA;VENTOLIN HFA) 108 (90 BASE) MCG/ACT inhaler Inhale 2 puffs into the lungs every 6 (six) hours as needed for wheezing or shortness of breath. 03/07/15   Anne-Caroline Sharma CovertNorman, MD  benzonatate (TESSALON PERLES) 100 MG capsule Take 2 capsules (200 mg total) by mouth 3 (three) times daily as needed for cough. 05/25/16 05/25/17  Evon Slackhomas C Gaines, PA-C  chlorpheniramine-HYDROcodone [facility](TUSSIONEX PENNKINETIC ER) 10-8 MG/5ML SUER Take 5 mLs by mouth at bedtime as needed for cough. 05/25/16   Evon Slackhomas C Gaines, PA-C  docusate sodium (COLACE) 100 MG capsule Take 1 capsule (100 mg total) by mouth 2 (two) times daily. 09/11/15   Ihor Austinhomas J Schermerhorn, MD  ibuprofen (ADVIL,MOTRIN) 600 MG tablet Take 1 tablet (600 mg total) by mouth every 6 (six) hours as needed for mild pain. 09/11/15   Ihor Austinhomas J Schermerhorn, MD  lidocaine (LIDODERM) 5 % Place 1 patch onto the skin every 12 (twelve) hours. Remove & Discard patch within 12 hours or as directed by MD 05/28/16 05/28/17  Rebecka ApleyAllison P Brandyce Dimario, MD  oxyCODONE (OXY IR/ROXICODONE) 5 MG immediate release tablet Take 1 tablet (5 mg total) by mouth every 4 (four) hours as needed (pain scale 4-7). 09/11/15   Ihor Austinhomas J Schermerhorn, MD  predniSONE (DELTASONE) 20 MG tablet Take 2 tablets (40 mg total) by mouth daily. 05/25/16   Maisie Fushomas  Kerry Fort, PA-C  Prenatal Vit-Fe Fumarate-FA (PRENATAL MULTIVITAMIN) TABS Take 1 tablet by mouth daily.    Historical Provider, MD  traMADol (ULTRAM) 50 MG tablet Take 1 tablet (50 mg total) by mouth every 6 (six) hours as needed. 05/28/16   Rebecka Apley, MD    Allergies Sulfa drugs cross reactors  Family History  Problem Relation Age of Onset  . Hypertension Mother   . Arthritis Mother   . Cancer Mother   . Diabetes Mother   . Cancer Father     Social History Social History    Substance Use Topics  . Smoking status: Never Smoker  . Smokeless tobacco: Never Used  . Alcohol use No    Review of Systems Constitutional: No fever/chills Eyes: No visual changes. ENT: No sore throat. Cardiovascular:  chest pain. Respiratory: Denies shortness of breath. Gastrointestinal: No abdominal pain.  No nausea, no vomiting.  No diarrhea.  No constipation. Genitourinary: Negative for dysuria. Musculoskeletal: Negative for back pain. Skin: Negative for rash. Neurological: Negative for headaches, focal weakness or numbness.  10-point ROS otherwise negative.  ____________________________________________   PHYSICAL EXAM:  VITAL SIGNS: ED Triage Vitals [05/27/16 2314]  Enc Vitals Group     BP (!) 134/93     Pulse Rate 68     Resp 18     Temp 97.7 F (36.5 C)     Temp Source Oral     SpO2 99 %     Weight 205 lb (93 kg)     Height 5\' 8"  (1.727 m)     Head Circumference      Peak Flow      Pain Score 9     Pain Loc      Pain Edu?      Excl. in GC?     Constitutional: Alert and oriented. Well appearing and in Moderate distress. Eyes: Conjunctivae are normal. PERRL. EOMI. Head: Atraumatic. Nose: No congestion/rhinnorhea. Mouth/Throat: Mucous membranes are moist.  Oropharynx non-erythematous. Cardiovascular: Normal rate, regular rhythm. Grossly normal heart sounds.  Good peripheral circulation. Respiratory: Normal respiratory effort.  No retractions. Lungs CTAB. Gastrointestinal: Soft and nontender. No distention. Positive bowel sounds Musculoskeletal: No lower extremity tenderness nor edema.   Neurologic:  Normal speech and language.  Skin:  Skin is warm, dry and intact.  Psychiatric: Mood and affect are normal.   ____________________________________________   LABS (all labs ordered are listed, but only abnormal results are displayed)  Labs Reviewed - No data to  display ____________________________________________  EKG  none ____________________________________________  RADIOLOGY  CXR ____________________________________________   PROCEDURES  Procedure(s) performed: None  Procedures  Critical Care performed: No  ____________________________________________   INITIAL IMPRESSION / ASSESSMENT AND PLAN / ED COURSE  Pertinent labs & imaging results that were available during my care of the patient were reviewed by me and considered in my medical decision making (see chart for details).  This is a 25 rolled female who came into the hospital today with some right sided chest pain after a coughing fit. The patient did receive a chest x-ray which did not show any rib fractures or pneumothorax. I did give the patient a DuoNeb as well as a Lidoderm patch tramadol and prednisone. The patient was coughing and I was attempting to help the cough. The patient had been prescribed prednisone as well as albuterol inhaler. The patient will be discharged home to follow-up with her primary care physician.  Clinical Course as of May 28 801  Tue May 28, 2016  0340 No acute cardiopulmonary process seen. No displaced rib fractures identified.   DG Chest 2 View [AW]    Clinical Course User Index [AW] Rebecka Apley, MD     ____________________________________________   FINAL CLINICAL IMPRESSION(S) / ED DIAGNOSES  Final diagnoses:  Cough  Chest wall pain  Bronchitis      NEW MEDICATIONS STARTED DURING THIS VISIT:  Discharge Medication List as of 05/28/2016  5:37 AM    START taking these medications   Details  lidocaine (LIDODERM) 5 % Place 1 patch onto the skin every 12 (twelve) hours. Remove & Discard patch within 12 hours or as directed by MD, Starting Tue 05/28/2016, Until Wed 05/28/2017, Print    traMADol (ULTRAM) 50 MG tablet Take 1 tablet (50 mg total) by mouth every 6 (six) hours as needed., Starting Tue 05/28/2016, Print          Note:  This document was prepared using Dragon voice recognition software and may include unintentional dictation errors.    Rebecka Apley, MD 05/28/16 [phone]

## 2016-05-28 NOTE — ED Notes (Signed)
MD  WEbster at bedside at this time,

## 2016-12-16 ENCOUNTER — Emergency Department
Admission: EM | Admit: 2016-12-16 | Discharge: 2016-12-16 | Disposition: A | Discharge status: Home / Self Care | Payer: Self-pay | Attending: Emergency Medicine | Admitting: Emergency Medicine

## 2016-12-16 ENCOUNTER — Encounter: Payer: Self-pay | Admitting: Emergency Medicine

## 2016-12-16 DIAGNOSIS — J069 Acute upper respiratory infection, unspecified: Secondary | ICD-10-CM | POA: Insufficient documentation

## 2016-12-16 DIAGNOSIS — Z8709 Personal history of other diseases of the respiratory system: Secondary | ICD-10-CM

## 2016-12-16 DIAGNOSIS — Z79899 Other long term (current) drug therapy: Secondary | ICD-10-CM | POA: Insufficient documentation

## 2016-12-16 DIAGNOSIS — J45909 Unspecified asthma, uncomplicated: Secondary | ICD-10-CM | POA: Insufficient documentation

## 2016-12-16 MED ORDER — ALBUTEROL SULFATE HFA 108 (90 BASE) MCG/ACT IN AERS: 2.0000 | INHALATION_SPRAY | Freq: Four times a day (QID) | RESPIRATORY_TRACT | 0 refills | Status: DC | PRN

## 2016-12-16 NOTE — ED Triage Notes (Signed)
Pt feels like asthma flaring.  This RN listened, no wheezing heard at this time with good air movement. Cough noted. Pt has had cough since Friday.  Is out of albuterol at home. Unlabored respirations currently.  Ambulatory without difficulty. No fevers.

## 2016-12-16 NOTE — ED Notes (Signed)
See triage note  States she developed some cold sx's last week  Developed cough since Friday with some chest congestion  No fever   resp even and non labored on arrival

## 2016-12-16 NOTE — ED Provider Notes (Signed)
[facility] Emergency Department Provider Note  ___________________________________________   First MD Initiated Contact with Patient 12/16/16 [phone]     (approximate)  I have reviewed the triage vital signs and the nursing notes.   HISTORY  Chief Complaint Asthma   HPI Erin [name] is a 26 y.o. female is here with complaint of nonproductive cough. Patient states that this began 2 days ago. She denies any fever or chills. She has a history of asthma but does not have an inhaler at this time. Currently she is taking Mucinex with relief of symptoms other than the cough. She denies smoking. She states she is unable to see her PCP due to financial reasons and would like to get an inhaler. She denies any chest pain or difficulty breathing.    Past Medical History:  Diagnosis Date  . Asthma   . Depression    postpartum depression in past  . GERD (gastroesophageal reflux disease)   . Seasonal allergies   . Seasonal allergies   . Seizures (HCC)    during childhood    Patient Active Problem List   Diagnosis Date Noted  . Post-operative state 09/08/2015  . Amniotic fluid leaking 08/12/2015  . Nausea and vomiting in pregnancy 07/05/2015  . Family history of Down syndrome 03/16/2015  . Hyperemesis affecting pregnancy, antepartum 03/10/2015  . Cough 03/10/2015  . Viral respiratory illness 03/10/2015    Past Surgical History:  Procedure Laterality Date  . CESAREAN SECTION  2014  . CESAREAN SECTION WITH BILATERAL TUBAL LIGATION Bilateral 09/08/2015   Procedure: CESAREAN SECTION WITH BILATERAL TUBAL LIGATION;  Surgeon: Suzy Bouchard, MD;  Location: [facility] ORS;  Service: Obstetrics;  Laterality: Bilateral;  . TONSILLECTOMY      Prior to Admission medications   Medication Sig Start Date End Date Taking? Authorizing Provider  albuterol (PROVENTIL HFA;VENTOLIN HFA) 108 (90 Base) MCG/ACT inhaler Inhale 2 puffs into the lungs every 6 (six) hours as  needed for wheezing or shortness of breath. 12/16/16   Bridget Hartshorn L, PA-C  docusate sodium (COLACE) 100 MG capsule Take 1 capsule (100 mg total) by mouth 2 (two) times daily. 09/11/15   Schermerhorn, Ihor Austin, MD  Prenatal Vit-Fe Fumarate-FA (PRENATAL MULTIVITAMIN) TABS Take 1 tablet by mouth daily.    [provider]    Allergies Sulfa drugs cross reactors  Family History  Problem Relation Age of Onset  . Hypertension Mother   . Arthritis Mother   . Cancer Mother   . Diabetes Mother   . Cancer Father     Social History Social History  Substance Use Topics  . Smoking status: Never Smoker  . Smokeless tobacco: Never Used  . Alcohol use No    Review of Systems Constitutional: No fever/chills Eyes: No visual changes. ENT: No sore throat. Negative for ear pain. Cardiovascular: Denies chest pain. Respiratory: Denies shortness of breath. Positive cough. Negative for wheezing. Gastrointestinal:   No nausea, no vomiting.   Neurological: Negative for headaches, focal weakness or numbness.   ____________________________________________   PHYSICAL EXAM:  VITAL SIGNS: ED Triage Vitals  Enc Vitals Group     BP 12/16/16 0859 128/89     Pulse Rate 12/16/16 0859 (!) 101     Resp 12/16/16 0859 20     Temp 12/16/16 0859 98.5 F (36.9 C)     Temp Source 12/16/16 0859 Oral     SpO2 12/16/16 0859 99 %     Weight 12/16/16 0855 208 lb (94.3  kg)     Height 12/16/16 0855 5\' 8"  (1.727 m)     Head Circumference --      Peak Flow --      Pain Score --      Pain Loc --      Pain Edu? --      Excl. in GC? --    Constitutional: Alert and oriented. Well appearing and in no acute distress. Eyes: Conjunctivae are normal. PERRL. EOMI. Head: Atraumatic. Nose: No congestion/rhinnorhea.  EACs and TMs are clear bilaterally. Mouth/Throat: Mucous membranes are moist.  Oropharynx non-erythematous. Neck: No stridor.   Hematological/Lymphatic/Immunilogical: No cervical  lymphadenopathy. Cardiovascular: Normal rate, regular rhythm. Grossly normal heart sounds.  Good peripheral circulation. Respiratory: Normal respiratory effort.  No retractions. Lungs CTAB. Musculoskeletal: Moves upper and lower studies without any difficulty. Normal gait was noted. Neurologic:  Normal speech and language. No gross focal neurologic deficits are appreciated. No gait instability. Skin:  Skin is warm, dry and intact. No rash noted. Psychiatric: Mood and affect are normal. Speech and behavior are normal.  ____________________________________________   LABS (all labs ordered are listed, but only abnormal results are displayed)  Labs Reviewed - No data to display ____________________________________________   PROCEDURES  Procedure(s) performed: None  Procedures  Critical Care performed: No  ____________________________________________   INITIAL IMPRESSION / ASSESSMENT AND PLAN / ED COURSE  Pertinent labs & imaging results that were available during my care of the patient were reviewed by me and considered in my medical decision making (see chart for details).  Patient was reassured that there was no wheezing noted on exam today. She currently is out of albuterol inhalers and was written a prescription to continue with the same. She is to follow-up with her PCP for any continued prescriptions and to follow-up if any concerns.   ____________________________________________   FINAL CLINICAL IMPRESSION(S) / ED DIAGNOSES  Final diagnoses:  Upper respiratory tract infection, unspecified type  History of asthma      NEW MEDICATIONS STARTED DURING THIS VISIT:  Discharge Medication List as of 12/16/2016  9:18 AM       Note:  This document was prepared using Dragon voice recognition software and may include unintentional dictation errors.    Tommi Rumps, PA-C 12/16/16 1723    Merrily Brittle, MD 12/17/16 [phone]

## 2016-12-16 NOTE — Discharge Instructions (Signed)
Follow-up with her primary care doctor if any continued problems. Pending using albuterol inhaler as needed for wheezing. Continue using Mucinex as directed. Increase fluids.

## 2018-01-26 ENCOUNTER — Other Ambulatory Visit: Payer: Self-pay

## 2018-01-26 ENCOUNTER — Encounter: Payer: Self-pay | Admitting: Emergency Medicine

## 2018-01-26 ENCOUNTER — Emergency Department
Admission: EM | Admit: 2018-01-26 | Discharge: 2018-01-26 | Disposition: A | Discharge status: Home / Self Care | Payer: BC Managed Care – PPO | Attending: Emergency Medicine | Admitting: Emergency Medicine

## 2018-01-26 DIAGNOSIS — M542 Cervicalgia: Secondary | ICD-10-CM | POA: Diagnosis not present

## 2018-01-26 DIAGNOSIS — R51 Headache: Secondary | ICD-10-CM | POA: Diagnosis present

## 2018-01-26 DIAGNOSIS — Z79899 Other long term (current) drug therapy: Secondary | ICD-10-CM | POA: Insufficient documentation

## 2018-01-26 DIAGNOSIS — J45909 Unspecified asthma, uncomplicated: Secondary | ICD-10-CM | POA: Diagnosis not present

## 2018-01-26 DIAGNOSIS — R519 Headache, unspecified: Secondary | ICD-10-CM

## 2018-01-26 LAB — COMPREHENSIVE METABOLIC PANEL
ALT: 18 U/L (ref 0–44)
AST: 18 U/L (ref 15–41)
Albumin: 4 g/dL (ref 3.5–5.0)
Alkaline Phosphatase: 62 U/L (ref 38–126)
Anion gap: 10 (ref 5–15)
BUN: 10 mg/dL (ref 6–20)
CHLORIDE: 99 mmol/L (ref 98–111)
CO2: 26 mmol/L (ref 22–32)
CREATININE: 0.82 mg/dL (ref 0.44–1.00)
Calcium: 8.9 mg/dL (ref 8.9–10.3)
GFR calc Af Amer: >60 mL/min (ref >60)
Glucose, Bld: 108 mg/dL — ABNORMAL HIGH (ref 70–99)
Potassium: 3.8 mmol/L (ref 3.5–5.1)
SODIUM: 135 mmol/L (ref 135–145)
Total Bilirubin: 0.5 mg/dL (ref 0.3–1.2)
Total Protein: 7.7 g/dL (ref 6.5–8.1)

## 2018-01-26 LAB — CBC WITH DIFFERENTIAL/PLATELET
Basophils Absolute: 0 10*3/uL (ref 0–0.1)
Basophils Relative: 0 %
EOS ABS: 0 10*3/uL (ref 0–0.7)
EOS PCT: 0 %
HCT: 37.7 % (ref 35.0–47.0)
Hemoglobin: 13.2 g/dL (ref 12.0–16.0)
LYMPHS ABS: 0.9 10*3/uL — AB (ref 1.0–3.6)
LYMPHS PCT: 10 %
MCH: 30.3 pg (ref 26.0–34.0)
MCHC: 35 g/dL (ref 32.0–36.0)
MCV: 86.7 fL (ref 80.0–100.0)
Monocytes Absolute: 0.4 10*3/uL (ref 0.2–0.9)
Monocytes Relative: 5 %
Neutro Abs: 7.3 10*3/uL — ABNORMAL HIGH (ref 1.4–6.5)
Neutrophils Relative %: 85 %
PLATELETS: 320 10*3/uL (ref 150–440)
RBC: 4.35 MIL/uL (ref 3.80–5.20)
RDW: 14.7 % — AB (ref 11.5–14.5)
WBC: 8.7 10*3/uL (ref 3.6–11.0)

## 2018-01-26 LAB — POCT PREGNANCY, URINE: Preg Test, Ur: NEGATIVE

## 2018-01-26 MED ORDER — PROCHLORPERAZINE MALEATE 10 MG PO TABS: 10.0000 mg | ORAL_TABLET | Freq: Three times a day (TID) | ORAL | 0 refills | Status: DC | PRN

## 2018-01-26 MED ORDER — PROCHLORPERAZINE EDISYLATE 10 MG/2ML IJ SOLN
10.0000 mg | Freq: Once | INTRAMUSCULAR | Status: AC
  Administered 2018-01-26: 10 mg via INTRAVENOUS
  Filled 2018-01-26: qty 2

## 2018-01-26 MED ORDER — SODIUM CHLORIDE 0.9 % IV BOLUS
1000.0000 mL | Freq: Once | INTRAVENOUS | Status: AC
  Administered 2018-01-26: 1000 mL via INTRAVENOUS

## 2018-01-26 NOTE — ED Provider Notes (Signed)
[facility] Emergency Department Provider Note  ____________________________________________   I have reviewed the triage vital signs and the nursing notes.   HISTORY  Chief Complaint Headache   History limited by: Not Limited   HPI Erin [name] is a 27 y.o. female who presents to the emergency department today with concerns for headache and neck pain.  The patient states that the neck pain started a few days ago.  She states she was at work when the patient started throwing things and kicking her.  She is being evaluated for this neck pain through [employer].  She states she had x-rays performed on it today which she was told were without concerning findings.  She however continues to complain of neck pain and complaining of headache as well.  The headache is located in the frontal part.   Per medical record review patient has a history of evaluation at [facility] for neck pain arising from injury one month ago.   Past Medical History:  Diagnosis Date  . Asthma   . Depression    postpartum depression in past  . GERD (gastroesophageal reflux disease)   . Seasonal allergies   . Seasonal allergies   . Seizures (HCC)    during childhood    Patient Active Problem List   Diagnosis Date Noted  . Post-operative state 09/08/2015  . Amniotic fluid leaking 08/12/2015  . Nausea and vomiting in pregnancy 07/05/2015  . Family history of Down syndrome 03/16/2015  . Hyperemesis affecting pregnancy, antepartum 03/10/2015  . Cough 03/10/2015  . Viral respiratory illness 03/10/2015    Past Surgical History:  Procedure Laterality Date  . CESAREAN SECTION  2014  . CESAREAN SECTION WITH BILATERAL TUBAL LIGATION Bilateral 09/08/2015   Procedure: CESAREAN SECTION WITH BILATERAL TUBAL LIGATION;  Surgeon: Suzy Bouchard, MD;  Location: [facility] ORS;  Service: Obstetrics;  Laterality: Bilateral;  . TONSILLECTOMY      Prior to Admission medications    Medication Sig Start Date End Date Taking? Authorizing Provider  albuterol (PROVENTIL HFA;VENTOLIN HFA) 108 (90 Base) MCG/ACT inhaler Inhale 2 puffs into the lungs every 6 (six) hours as needed for wheezing or shortness of breath. 12/16/16   Bridget Hartshorn L, PA-C  docusate sodium (COLACE) 100 MG capsule Take 1 capsule (100 mg total) by mouth 2 (two) times daily. 09/11/15   Schermerhorn, Ihor Austin, MD  Prenatal Vit-Fe Fumarate-FA (PRENATAL MULTIVITAMIN) TABS Take 1 tablet by mouth daily.    [provider]    Allergies Sulfa drugs cross reactors  Family History  Problem Relation Age of Onset  . Hypertension Mother   . Arthritis Mother   . Cancer Mother   . Diabetes Mother   . Cancer Father     Social History Social History   Tobacco Use  . Smoking status: Never Smoker  . Smokeless tobacco: Never Used  Substance Use Topics  . Alcohol use: No  . Drug use: No    Comment: tussionex cough syrup x 1 week 03-16-15    Review of Systems Constitutional: No fever/chills Eyes: No visual changes. ENT: No sore throat. Cardiovascular: Denies chest pain. Respiratory: Denies shortness of breath. Gastrointestinal: No abdominal pain.  No nausea, no vomiting.  No diarrhea.   Genitourinary: Negative for dysuria. Musculoskeletal: Positive for neck pain. Skin: Negative for rash. Neurological: Positive for headache.   ____________________________________________   PHYSICAL EXAM:  VITAL SIGNS: ED Triage Vitals  Enc Vitals Group     BP 01/26/18 1537 128/87  Pulse Rate 01/26/18 1537 85     Resp 01/26/18 1537 20     Temp 01/26/18 1537 99.8 F (37.7 C)     Temp Source 01/26/18 1537 Oral     SpO2 01/26/18 1537 99 %     Weight 01/26/18 1538 223 lb (101.2 kg)     Height 01/26/18 1538 5\' 8"  (1.727 m)     Head Circumference --      Peak Flow --      Pain Score 01/26/18 1538 8     Pain Loc --      Pain Edu? --      Excl. in GC? --      Constitutional: Alert and oriented.   Eyes: Conjunctivae are normal.  ENT      Head: Normocephalic and atraumatic.      Nose: No congestion/rhinnorhea.      Mouth/Throat: Mucous membranes are moist.      Neck: No stridor. Hematological/Lymphatic/Immunilogical: No cervical lymphadenopathy. Cardiovascular: Normal rate, regular rhythm.  No murmurs, rubs, or gallops.  Respiratory: Normal respiratory effort without tachypnea nor retractions. Breath sounds are clear and equal bilaterally. No wheezes/rales/rhonchi. Gastrointestinal: Soft and non tender. No rebound. No guarding.  Genitourinary: Deferred Musculoskeletal: Normal range of motion in all extremities. No lower extremity edema. Neurologic:  Normal speech and language. No gross focal neurologic deficits are appreciated.  Skin:  Skin is warm, dry and intact. No rash noted. Psychiatric: Mood and affect are normal. Speech and behavior are normal. Patient exhibits appropriate insight and judgment.  ____________________________________________    LABS (pertinent positives/negatives)  Upreg negative CMP wnl except glu 108 CBC wbc 8.7, hgb 13.2, plt 320  ____________________________________________   EKG  None  ____________________________________________    RADIOLOGY  None  ____________________________________________   PROCEDURES  Procedures  ____________________________________________   INITIAL IMPRESSION / ASSESSMENT AND PLAN / ED COURSE  Pertinent labs & imaging results that were available during my care of the patient were reviewed by me and considered in my medical decision making (see chart for details).   Patient presented to the emergency department today because of concerns for headache and neck pain.  Has been evaluated for her neck pain after a workplace injury.  Terms of the headache her symptoms did improve significantly after IV fluids and medication.  At this point given good improvement do not feel any emergent neuroimaging is  necessary.  Will discharge with prescription for oral medication.   ____________________________________________   FINAL CLINICAL IMPRESSION(S) / ED DIAGNOSES  Final diagnoses:  Bad headache     Note: This dictation was prepared with Dragon dictation. Any transcriptional errors that result from this process are unintentional     Phineas Semen, MD 01/26/18 1610

## 2018-01-26 NOTE — Discharge Instructions (Addendum)
Please seek medical attention for any high fevers, chest pain, shortness of breath, change in behavior, persistent vomiting, bloody stool or any other new or concerning symptoms.  

## 2018-01-26 NOTE — ED Triage Notes (Addendum)
Headache x 3 days. Denies fevers, head injury of use of blood thinners. No rash.

## 2018-06-11 ENCOUNTER — Emergency Department: Payer: Self-pay

## 2018-06-11 ENCOUNTER — Encounter: Payer: Self-pay | Admitting: Emergency Medicine

## 2018-06-11 ENCOUNTER — Other Ambulatory Visit: Payer: Self-pay

## 2018-06-11 ENCOUNTER — Emergency Department
Admission: EM | Admit: 2018-06-11 | Discharge: 2018-06-11 | Disposition: A | Discharge status: Home / Self Care | Payer: Self-pay | Attending: Emergency Medicine | Admitting: Emergency Medicine

## 2018-06-11 DIAGNOSIS — S161XXA Strain of muscle, fascia and tendon at neck level, initial encounter: Secondary | ICD-10-CM | POA: Insufficient documentation

## 2018-06-11 DIAGNOSIS — Y92210 Daycare center as the place of occurrence of the external cause: Secondary | ICD-10-CM | POA: Insufficient documentation

## 2018-06-11 DIAGNOSIS — Y939 Activity, unspecified: Secondary | ICD-10-CM | POA: Insufficient documentation

## 2018-06-11 DIAGNOSIS — S8002XA Contusion of left knee, initial encounter: Secondary | ICD-10-CM | POA: Insufficient documentation

## 2018-06-11 DIAGNOSIS — W19XXXA Unspecified fall, initial encounter: Secondary | ICD-10-CM

## 2018-06-11 DIAGNOSIS — W0110XA Fall on same level from slipping, tripping and stumbling with subsequent striking against unspecified object, initial encounter: Secondary | ICD-10-CM | POA: Insufficient documentation

## 2018-06-11 DIAGNOSIS — Y999 Unspecified external cause status: Secondary | ICD-10-CM | POA: Insufficient documentation

## 2018-06-11 MED ORDER — KETOROLAC TROMETHAMINE 30 MG/ML IJ SOLN
30.0000 mg | Freq: Once | INTRAMUSCULAR | Status: AC
  Administered 2018-06-11: 30 mg via INTRAMUSCULAR
  Filled 2018-06-11: qty 1

## 2018-06-11 MED ORDER — KETOROLAC TROMETHAMINE 10 MG PO TABS: 10.0000 mg | ORAL_TABLET | Freq: Three times a day (TID) | ORAL | 0 refills | Status: DC

## 2018-06-11 MED ORDER — METAXALONE 800 MG PO TABS: 800.0000 mg | ORAL_TABLET | Freq: Three times a day (TID) | ORAL | 0 refills | Status: AC

## 2018-06-11 NOTE — ED Provider Notes (Signed)
[facility] Emergency Department Provider Note ____________________________________________  Time seen: 1450  I have reviewed the triage vital signs and the nursing notes.  HISTORY  Chief Complaint  Fall; Back Pain; Knee Injury; Numbness; and Neck Injury  HPI Erin [name] is a 28 y.o. female presents to the ED for evaluation after an accidental injury at daycare yesterday.  Patient was dropping off her son who she was carrying into the building, when she apparently slipped on rain water.  She describes falling onto her left knee and her bottom.  She was trying to avoid injuring her son or dropping him during the fall.  She presents now with complaints of pain to the mid back at the neck, and some generalized symptoms with movement of the arms and shoulders.  Patient denies any head injury, loss of consciousness, nausea, vomiting, dizziness.  She presents now with increasing muscle pain and soreness primarily to the base of the neck and upper back.  She describes only a small contusion to the left knee.  Past Medical History:  Diagnosis Date  . Asthma   . Depression    postpartum depression in past  . GERD (gastroesophageal reflux disease)   . Seasonal allergies   . Seasonal allergies   . Seizures (HCC)    during childhood    Patient Active Problem List   Diagnosis Date Noted  . Post-operative state 09/08/2015  . Amniotic fluid leaking 08/12/2015  . Nausea and vomiting in pregnancy 07/05/2015  . Family history of Down syndrome 03/16/2015  . Hyperemesis affecting pregnancy, antepartum 03/10/2015  . Cough 03/10/2015  . Viral respiratory illness 03/10/2015    Past Surgical History:  Procedure Laterality Date  . CESAREAN SECTION  2014  . CESAREAN SECTION WITH BILATERAL TUBAL LIGATION Bilateral 09/08/2015   Procedure: CESAREAN SECTION WITH BILATERAL TUBAL LIGATION;  Surgeon: Suzy Bouchardhomas J Schermerhorn, MD;  Location: [facility] ORS;  Service: Obstetrics;   Laterality: Bilateral;  . TONSILLECTOMY      Prior to Admission medications   Medication Sig Start Date End Date Taking? Authorizing Provider  albuterol (PROVENTIL HFA;VENTOLIN HFA) 108 (90 Base) MCG/ACT inhaler Inhale 2 puffs into the lungs every 6 (six) hours as needed for wheezing or shortness of breath. 12/16/16   Bridget HartshornSummers, Rhonda L, PA-C  docusate sodium (COLACE) 100 MG capsule Take 1 capsule (100 mg total) by mouth 2 (two) times daily. 09/11/15   Schermerhorn, Ihor Austinhomas J, MD  ketorolac (TORADOL) 10 MG tablet Take 1 tablet (10 mg total) by mouth every 8 (eight) hours. 06/11/18   Andry Bogden, Charlesetta IvoryJenise V Bacon, PA-C  metaxalone (SKELAXIN) 800 MG tablet Take 1 tablet (800 mg total) by mouth 3 (three) times daily for 10 days. 06/11/18 06/21/18  Zain Bingman, Charlesetta IvoryJenise V Bacon, PA-C  Prenatal Vit-Fe Fumarate-FA (PRENATAL MULTIVITAMIN) TABS Take 1 tablet by mouth daily.    [provider]  prochlorperazine (COMPAZINE) 10 MG tablet Take 1 tablet (10 mg total) by mouth every 8 (eight) hours as needed (headache). 01/26/18   Phineas SemenGoodman, Graydon, MD   Allergies Sulfa drugs cross reactors  Family History  Problem Relation Age of Onset  . Hypertension Mother   . Arthritis Mother   . Cancer Mother   . Diabetes Mother   . Cancer Father     Social History Social History   Tobacco Use  . Smoking status: Never Smoker  . Smokeless tobacco: Never Used  Substance Use Topics  . Alcohol use: No  . Drug use: No  Comment: tussionex cough syrup x 1 week 03-16-15    Review of Systems  Constitutional: Negative for fever. Cardiovascular: Negative for chest pain. Respiratory: Negative for shortness of breath. Gastrointestinal: Negative for abdominal pain, vomiting and diarrhea. Genitourinary: Negative for dysuria. Musculoskeletal: Positive for upper back pain. Skin: Negative for rash. Neurological: Negative for headaches, focal weakness or numbness. ____________________________________________  PHYSICAL  EXAM:  VITAL SIGNS: ED Triage Vitals  Enc Vitals Group     BP 06/11/18 1215 126/77     Pulse Rate 06/11/18 1215 74     Resp 06/11/18 1215 16     Temp 06/11/18 1215 98 F (36.7 C)     Temp Source 06/11/18 1215 Oral     SpO2 06/11/18 1215 100 %     Weight 06/11/18 1216 220 lb (99.8 kg)     Height 06/11/18 1216 5\' 8"  (1.727 m)     Head Circumference --      Peak Flow --      Pain Score 06/11/18 1215 7     Pain Loc --      Pain Edu? --      Excl. in GC? --     Constitutional: Alert and oriented. Well appearing and in no distress. Head: Normocephalic and atraumatic. Eyes: Conjunctivae are normal. Normal extraocular movements Neck: Supple.  Normal range of motion without crepitus.  No distracting midline tenderness is appreciated.  Patient is mildly tender to palpation at the base of the neck and upper thoracic region.  Also with some tenderness across the upper trapezius musculature bilaterally.   Cardiovascular: Normal rate, regular rhythm. Normal distal pulses. Respiratory: Normal respiratory effort. No wheezes/rales/rhonchi. Gastrointestinal: Soft and nontender. No distention. Musculoskeletal: Nontender with normal range of motion in all extremities.  Neurologic: Cranial nerves II through XII grossly intact.  Normal UEs DTRs bilaterally.  Normal intrinsic and opposition testing.  Normal gait without ataxia. Normal speech and language. No gross focal neurologic deficits are appreciated. Skin:  Skin is warm, dry and intact. No rash noted. ____________________________________________  PROCEDURES  Procedures Toradol 30 mg IM ____________________________________________  INITIAL IMPRESSION / ASSESSMENT AND PLAN / ED COURSE  Patient with ED evaluation of injury sustained following a mechanical fall yesterday.  Patient presents with complaints of pain to the posterior neck and upper back as well as some mild knee pain.  Her exam is overall reassuring as it shows no acute  neuromuscular deficit.  No crepitus to the neck and no spinous process tenderness is elicited.  Symptoms likely represent cervical strain and a mild knee contusion on the left.  Patient will be discharged with prescriptions for ketorolac and metaxalone to take as directed.  A work note is provided for 2 days as requested.  Return precautions have been reviewed. ____________________________________________  FINAL CLINICAL IMPRESSION(S) / ED DIAGNOSES  Final diagnoses:  Fall, initial encounter  Contusion of left knee, initial encounter  Acute strain of neck muscle, initial encounter      Lissa HoardMenshew, Maie Kesinger V Bacon, PA-C 06/12/18 1813    Phineas SemenGoodman, Graydon, MD 06/12/18 [id number]

## 2018-06-11 NOTE — Discharge Instructions (Signed)
Your exam is consistent with muscle strain and contusion. You may experience some continued muscle pain and spasms for the next few days. Take the prescription meds as directed. Follow-up with your provider or return as needed.

## 2018-06-11 NOTE — ED Triage Notes (Signed)
Fell at daycare slipped in rainwater.  Fell onto left knee and bottom.  Back pain, left knee pain and now has been having numbness in whole face when she lifts arms.  Patient is in nad.  Says she did not have loc when she fell.

## 2019-05-25 ENCOUNTER — Ambulatory Visit: Payer: Commercial Managed Care - PPO | Admitting: Neurology

## 2019-05-25 ENCOUNTER — Other Ambulatory Visit: Payer: Self-pay

## 2019-05-25 ENCOUNTER — Telehealth: Payer: Self-pay

## 2019-05-25 ENCOUNTER — Encounter: Payer: Self-pay | Admitting: Neurology

## 2019-05-25 VITALS — BP 142/94 | HR 76 | Temp 97.4°F | Ht 67.0 in | Wt 232.0 lb

## 2019-05-25 DIAGNOSIS — R200 Anesthesia of skin: Secondary | ICD-10-CM | POA: Diagnosis not present

## 2019-05-25 DIAGNOSIS — G508 Other disorders of trigeminal nerve: Secondary | ICD-10-CM

## 2019-05-25 DIAGNOSIS — M7918 Myalgia, other site: Secondary | ICD-10-CM

## 2019-05-25 DIAGNOSIS — R519 Headache, unspecified: Secondary | ICD-10-CM | POA: Diagnosis not present

## 2019-05-25 DIAGNOSIS — M5481 Occipital neuralgia: Secondary | ICD-10-CM | POA: Diagnosis not present

## 2019-05-25 NOTE — Telephone Encounter (Signed)
Dr. Lucia Gaskins reviewed the message, appears this is the nerve block wearing off. I spoke with the pt. She said about 15 minutes after she left the office she had a severe pain/burning sensation in her head/neck that has improved some. However she is not used to this feeling. She does still report burning sensation, feels the muscle in her neck when she turns her head. Pt advised she may have discomfort at the injection sites that could last a couple days or so. Pt aware she received both shorter and longer lasting numbing agents and the outcome is hard to predict for the injections. Pt was encouraged to try to rest, monitor. If not improved, call back tomorrow. If worsens overnight and has concerns, proceed to ER. I let her know I would give her a call back as well. Pt appreciative.

## 2019-05-25 NOTE — Progress Notes (Signed)
Nerve block w/o steroid: Pt signed consent  0.5% Bupivocaine 4.5 mL LOT: [id number] EXP: 02/2020 NDC: 96283-662-94  2% Lidocaine 4.5 mL LOT: [id number] EXP: 10/28/2019 NDC: 7654-6503-54

## 2019-05-25 NOTE — Telephone Encounter (Signed)
Patient called stating she believes she is having reaction to the shot she was given. She states half of her face is numb and she can barely feel anything on it and she has a burning sensation . Patient  feels like she has a pressure point as well. that even if she lays her head down on a pillow she still feels the pain.  Patient states she also feels a pulling sensation in the back of her head.

## 2019-05-25 NOTE — Progress Notes (Signed)
[facility]    Provider:  Dr Lucia Gaskins Requesting Provider: Sheran Luz, MD Primary Care Provider:  Kandyce Rud, MD  CC:  Head pain  HPI:  Erin [name] is a 29 y.o. female here as requested by Sheran Luz, MD for headaches and facial pain. PMHx seizures, GERD, depression, asthma, chronic neck and low back pain.  Patient's been seen by Dr. 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  She reports pain in the back of her head they start to hurt due to the tightness in her neck. Tightness in the muscles of the cervical spine. Pain starts (points to the emergence of the occipital nerve) and then radiates up the back of he head to the temples, severe, shooting, brief. Her face feels numb, she also feels her lips get tingly with the pain. She feels drained when she has the pain. No focal weakness. No changes in bowel bladder. Being on the computer all day and turning can cause it. No hx of migraines. No vision changes. No nausea or vomiting. She has had this in the past but now worsening in frequency every day due to neck tightness. Tried muscle relaxers, heat. She even has a TENs unit. No other focal neurologic deficits, associated symptoms, inciting events or modifiable factors.  She has tried venlafaxine, meloxicam, Skelaxin, tizanidine.  Reviewed notes, labs and imaging from outside physicians, which showed:  CBC, CMP 12/2017 unremarkable, tsh 05/11/2019 normal 1.118,   I  reviewed MRI cervical spine report which is largely normal except for some straightening of the cervical lordosis which can be due to muscle spasms, I will request CD with images to further review.  Review of Systems: Patient complains of symptoms per HPI as well as the following symptoms: neck pain, head pain, . Pertinent negatives and positives per HPI. All others negative.   Social History   Socioeconomic History  . Marital status: Single    Spouse name: Not on file  . Number of children: 2  . Years of education: Not on file  . Highest education level: Some college, no degree  Occupational History  . Not on file  Tobacco Use  . Smoking status: Former Smoker    Packs/day: 0.10  . Smokeless tobacco: Never Used  Substance and Sexual Activity  . Alcohol use: Yes    Comment: occasional   . Drug use: No    Comment: tussionex cough syrup x 1 week 03-16-15  . Sexual activity: Yes    Birth control/protection: None, Surgical  Other Topics Concern  . Not on file  Social History Narrative   Lives at home with her children   Right handed   Caffeine: maybe 3 cups a week   Social Determinants of Health   Financial Resource Strain:   . Difficulty of Paying Living Expenses: Not on file  Food Insecurity:   . Worried About Programme researcher, broadcasting/film/video in the Last Year: Not on file  . Ran Out of Food in the Last Year: Not  on file  Transportation Needs:   . Lack of Transportation (Medical): Not on file  . Lack of Transportation (Non-Medical): Not on file  Physical Activity:   . Days of Exercise per Week: Not on file  . Minutes of Exercise per Session: Not on file  Stress:   . Feeling of Stress : Not on file  Social Connections:   . Frequency of Communication with Friends and Family: Not on file  . Frequency of Social Gatherings with Friends and Family: Not on file  . Attends Religious Services: Not on file  . Active Member of Clubs or Organizations: Not on file  . Attends Theatre manager Meetings: Not on file  . Marital Status: Not on file  Intimate Partner Violence:   . Fear of Current or Ex-Partner: Not on file  . Emotionally Abused: Not on file  . Physically Abused: Not on file  . Sexually Abused: Not on file    Family History  Problem Relation Age of Onset  . Hypertension Mother   . Arthritis Mother   . Cancer Mother   . Diabetes Mother   . Migraines Mother   . Cancer Father   . Diabetes Maternal Grandmother     Past Medical History:  Diagnosis Date  . ADD (attention deficit disorder)    ADHD  . Anxiety   . Asthma   . Chronic back pain   . Depression    postpartum depression in past  . Fatigue   . GERD (gastroesophageal reflux disease)   . Numbness and tingling   . Seasonal allergies   . Seasonal allergies   . Seizures ([place])    during childhood    Patient Active Problem List   Diagnosis Date Noted  . Myofascial pain syndrome, cervical 05/26/2019  . Cervico-occipital neuralgia 05/26/2019  . Post-operative state 09/08/2015  . Amniotic fluid leaking 08/12/2015  . Nausea and vomiting in pregnancy 07/05/2015  . Family history of Down syndrome 03/16/2015  . Hyperemesis affecting pregnancy, antepartum 03/10/2015  . Cough 03/10/2015  . Viral respiratory illness 03/10/2015    Past Surgical History:  Procedure Laterality Date  . CESAREAN SECTION  2014  . CESAREAN SECTION WITH BILATERAL TUBAL LIGATION Bilateral 09/08/2015   Procedure: CESAREAN SECTION WITH BILATERAL TUBAL LIGATION;  Surgeon: Boykin Nearing, MD;  Location: [facility] ORS;  Service: Obstetrics;  Laterality: Bilateral;  . TONSILLECTOMY      Current Outpatient Medications  Medication Sig Dispense Refill  . albuterol (VENTOLIN HFA) 108 (90 Base) MCG/ACT inhaler Inhale 2 puffs into the lungs every 6 (six) hours as needed for wheezing or shortness of breath.    Marland Kitchen buPROPion (WELLBUTRIN XL) 150 MG 24 hr tablet Take 150 mg by mouth daily.    . meloxicam (MOBIC) 15 MG tablet  Take 15 mg by mouth daily.    . metaxalone (SKELAXIN) 800 MG tablet Take 800 mg by mouth 3 (three) times daily as needed for muscle spasms.    Marland Kitchen venlafaxine (EFFEXOR) 37.5 MG tablet Take 37.5 mg by mouth daily.    Marland Kitchen tiZANidine (ZANAFLEX) 4 MG capsule Take 4 mg by mouth 3 (three) times daily as needed for muscle spasms.     No current facility-administered medications for this visit.    Allergies as of 05/25/2019 - Review Complete 05/25/2019  Allergen Reaction Noted  . Sulfa antibiotics Hives 05/25/2019  . Sulfa drugs cross reactors Other (See Comments) 01/24/2012    Vitals: BP (!) 142/94 (BP Location: Right Arm, Patient  Position: Sitting, Cuff Size: Large)   Pulse 76   Temp (!) 97.4 F (36.3 C) Comment: taken at front  Ht 5\' 7"  (1.702 m)   Wt 232 lb (105.2 kg)   Breastfeeding No   BMI 36.34 kg/m  Last Weight:  Wt Readings from Last 1 Encounters:  05/25/19 232 lb (105.2 kg)   Last Height:   Ht Readings from Last 1 Encounters:  05/25/19 5\' 7"  (1.702 m)     Physical exam: Exam: Gen: NAD, conversant, well nourised, obese, well groomed                     CV: RRR, no MRG. No Carotid Bruits. No peripheral edema, warm, nontender Eyes: Conjunctivae clear without exudates or hemorrhage  Neuro: Detailed Neurologic Exam  Speech:    Speech is normal; fluent and spontaneous with normal comprehension.  Cognition:    The patient is oriented to person, place, and time;     recent and remote memory intact;     language fluent;     normal attention, concentration,     fund of knowledge Cranial Nerves:    The pupils are equal, round, and reactive to light. The fundi are normal and spontaneous venous pulsations are present. Visual fields are full to finger confrontation. Extraocular movements are intact. Trigeminal sensation is intact and the muscles of mastication are normal. The face is symmetric. The palate elevates in the midline. Hearing intact. Voice is normal. Shoulder shrug is  normal. The tongue has normal motion without fasciculations.   Coordination:    Normal finger to nose and heel to shin. Normal rapid alternating movements.   Gait:    Heel-toe and tandem gait are normal.   Motor Observation:    No asymmetry, no atrophy, and no involuntary movements noted. Tone:    Normal muscle tone.    Posture:    Posture is normal. normal erect    Strength:    Strength is V/V in the upper and lower limbs.      Sensation: intact to LT     Reflex Exam:  DTR's:    Deep tendon reflexes in the upper and lower extremities are normal bilaterally.   Toes:    The toes are downgoing bilaterally.   Clonus:    Clonus is absent.    Assessment/Plan:  29 year old with occipital nerve pain in the setting of very tight cervical muscles/myofascial pain which is likely the cause of the irritation. However she describes facial numbness which is unusual so would order an MR of the brain to ensure no compressive etiology or MS or other. Neuro exam in non focal. Pain is bilateral and is located in the distribution of the greater, lesser and/or third occipital nerves, paroxysmal and brief, painful, sharp, with tenderness and trigger points at the emergence of the greater occipital nerve. Differential includes pain in the upper cervical joints or disks, suboccipital or upper posterior neck muscles including the traps/scm, spinal and posterior cranial fossa dura mater, vertebral arteries, structural and infiltrative lesions such as meningioma, schwannoma, myelitis, compressive disk disease and others. Will need MRI of the brain. MRI cervical spine only showed some straightening of the cervical lordosis but this is consistent with muscle spasms which I think is causing her pain.  PT: Cervical Myofascial Pain syndrome/musculoskeletal neck pain causing occipital neuralgia. Evaluate and treat physical therapy also include Dry Needling.  Will perform nerve blocks today, trigger  points  20553: bilateral trapezius, paraspinal  cervical muscles, occipitalis  F/u after PT, we are happy to see her if the dry needling does not help, also Dr. Ethelene Hal can perform occipital nerve blocks and trigger point injections if needed. Could try steroids as well.   Orders Placed This Encounter  Procedures  . MR BRAIN W WO CONTRAST  . Ambulatory referral to Physical Therapy    Cc: Kandyce Rud, MD,  Sheran Luz, MD  Naomie Dean, MD  [facility] [address] [place], [place] [address]  Phone [phone] Fax [phone]

## 2019-05-25 NOTE — Patient Instructions (Signed)
MR of the brain w/wo contrast Physical Therapy and Dry Needling Nerve Blocks/trigger points today  Occipital Nerve Block Patient Information  Description: The occipital nerves originate in the cervical (neck) spinal cord and travel upward through muscle and tissue to supply sensation to the back of the head and top of the scalp.  In addition, the nerves control some of the muscles of the scalp.  Occipital neuralgia is an irritation of these nerves which can cause headaches, numbness of the scalp, and neck discomfort.     The occipital nerve block will interrupt nerve transmission through these nerves and can relieve pain and spasm.  The block consists of insertion of a small needle under the skin in the back of the head to deposit local anesthetic (numbing medicine) and/or steroids around the nerve.  The entire block usually lasts less than 5 minutes.  Conditions which may be treated by occipital blocks:   Muscular pain and spasm of the scalp  Nerve irritation, back of the head  Headaches  Upper neck pain  Preparation for the injection:  1. Do not eat any solid food or dairy products within 8 hours of your appointment. 2. You may drink clear liquids up to 3 hours before appointment.  Clear liquids include water, black coffee, juice or soda.  No milk or cream please. 3. You may take your regular medication, including pain medications, with a sip of water before you appointment.  Diabetics should hold regular insulin (if taken separately) and take 1/2 normal NPH dose the morning of the procedure.  Carry some sugar containing items with you to your appointment. 4. A driver must accompany you and be prepared to drive you home after your procedure. 5. Bring all your current medications with you. 6. An IV may be inserted and sedation may be given at the discretion of the physician. 7. A blood pressure cuff, EKG, and other monitors will often be applied during the procedure.  Some patients may  need to have extra oxygen administered for a short period. 8. You will be asked to provide medical information, including your allergies and medications, prior to the procedure.  We must know immediately if you are taking blood thinners (like Coumadin/Warfarin) or if you are allergic to IV iodine contrast (dye).  We must know if you could possible be pregnant.  9. Do not wear a high collared shirt or turtleneck.  Tie long hair up in the back if possible.  Possible side-effects:   Bleeding from needle site  Infection (rare, may require surgery)  Nerve injury (rare)  Hair on back of neck can be tinged with iodine scrub (this will wash out)  Light-headedness (temporary)  Pain at injection site (several days)  Decreased blood pressure (rare, temporary)  Seizure (very rare)  Call if you experience:   Hives or difficulty breathing ( go to the emergency room)  Inflammation or drainage at the injection site(s)  Please note:  Although the local anesthetic injected can often make your painful muscles or headache feel good for several hours after the injection, the pain may return.  It takes 3-7 days for steroids to work.  You may not notice any pain relief for at least one week.  If effective, we will often do a series of injections spaced 3-6 weeks apart to maximally decrease your pain.  If you have any questions, please call [phone] [facility]  Occipital Neuralgia  Occipital neuralgia is a type of headache  that causes brief episodes of very bad pain in the back of your head. Pain from occipital neuralgia may spread (radiate) to other parts of your head. These headaches may be caused by irritation of the nerves that leave your spinal cord high up in your neck, just below the base of your skull (occipital nerves). Your occipital nerves transmit sensations from the back of your head, the top of your head, and the areas behind your ears. What  are the causes? This condition can occur without any known cause (primary headache syndrome). In other cases, this condition is caused by pressure on or irritation of one of the two occipital nerves. Pressure and irritation may be due to:  Muscle spasm in the neck.  Neck injury.  Wear and tear of the vertebrae in the neck (osteoarthritis).  Disease of the disks that separate the vertebrae.  Swollen blood vessels that put pressure on the occipital nerves.  Infections.  Tumors.  Diabetes. What are the signs or symptoms? This condition causes brief burning, stabbing, electric, shocking, or shooting pain which can radiate to the top of the head. It can happen on one side or both sides of the head. It can also cause:  Pain behind the eye.  Pain triggered by neck movement or hair brushing.  Scalp tenderness.  Aching in the back of the head between episodes of very bad pain.  Pain gets worse with exposure to bright lights. How is this diagnosed? There is no test that diagnoses this condition. Your health care provider may diagnose this condition based on a physical exam and your symptoms. Other tests may be done, such as:  Imaging studies of the brain and neck (cervical spine), such as an MRI or CT scan. These look for causes of pinched nerves.  Applying pressure to the nerves in the neck to try to re-create the pain.  Injection of numbing medicine into the occipital nerve areas to see if pain goes away (diagnostic nerve block). How is this treated? Treatment for this condition may begin with simple measures, such as:  Rest.  Massage.  Applying heat or cold on the area.  Over-the-counter pain relievers. If these measures do not work, you may need other treatments, including:  Medicines, such as: ? Prescription-strength anti-inflammatory medicines. ? Muscle relaxants. ? Anti-seizure medicines, which can relieve pain. ? Antidepressants, which can relieve pain. ? Injected  medicines, such as medicines that numb the area (local anesthetic) and steroids.  Pulsed radiofrequency ablation. This is when wires are implanted to deliver electrical impulses that block pain signals from the occipital nerve.  Surgery to relieve nerve pressure.  Physical therapy. Follow these instructions at home: Pain management      Avoid any activities that cause pain.  Rest when you have an attack of pain.  Try gentle massage to relieve pain.  Try a different pillow or sleeping position.  If directed, apply heat to the affected area as told by your health care provider. Use the heat source that your health care provider recommends, such as a moist heat pack or a heating pad. ? Place a towel between your skin and the heat source. ? Leave the heat on for 20-30 minutes. ? Remove the heat if your skin turns bright red. This is especially important if you are unable to feel pain, heat, or cold. You may have a greater risk of getting burned.  If directed, apply ice to the back of the head and neck area as told  by your health care provider. ? Put ice in a plastic bag. ? Place a towel between your skin and the bag. ? Leave the ice on for 20 minutes, 2-3 times per day. General instructions  Take over-the-counter and prescription medicines only as told by your health care provider.  Avoid things that make your symptoms worse, such as bright lights.  Try to stay active. Get regular exercise that does not cause pain. Ask your health care provider to suggest safe exercises for you.  Work with a physical therapist to learn stretching exercises you can do at home.  Practice good posture.  Keep all follow-up visits as told by your health care provider. This is important. Contact a health care provider if:  Your medicine is not working.  You have new or worsening symptoms. Get help right away if:  You have very bad head pain that does not go away.  You have a sudden change in  vision, balance, or speech. Summary  Occipital neuralgia is a type of headache that causes brief episodes of very bad pain in the back of your head.  Pain from occipital neuralgia may spread (radiate) to other parts of your head.  Treatment for this condition includes rest, massage, and medicines. This information is not intended to replace advice given to you by your health care provider. Make sure you discuss any questions you have with your health care provider. Document Revised: 04/01/2017 Document Reviewed: 06/20/2016 Elsevier Patient Education  2020 [employer].

## 2019-05-25 NOTE — Telephone Encounter (Signed)
Reviewed with Dr. Lucia Gaskins. Pt can take some Tylenol and rest. This is normal. I spoke with the pt and gave her Dr. Trevor Mace message. I let pt know per Dr. Lucia Gaskins, injections only go into the skin. I asked the pt to call us back tomorrow to update if she is not better. She verbalized appreciation and understanding.

## 2019-05-26 ENCOUNTER — Encounter: Payer: Self-pay | Admitting: Neurology

## 2019-05-26 DIAGNOSIS — M7918 Myalgia, other site: Secondary | ICD-10-CM | POA: Insufficient documentation

## 2019-05-26 DIAGNOSIS — M5481 Occipital neuralgia: Secondary | ICD-10-CM | POA: Insufficient documentation

## 2019-05-31 ENCOUNTER — Other Ambulatory Visit: Payer: Self-pay

## 2019-05-31 ENCOUNTER — Emergency Department: Payer: Commercial Managed Care - PPO

## 2019-05-31 ENCOUNTER — Encounter: Payer: Self-pay | Admitting: Emergency Medicine

## 2019-05-31 ENCOUNTER — Telehealth: Payer: Self-pay | Admitting: Neurology

## 2019-05-31 ENCOUNTER — Emergency Department
Admission: EM | Admit: 2019-05-31 | Discharge: 2019-06-01 | Disposition: A | Discharge status: Home / Self Care | Payer: Commercial Managed Care - PPO | Attending: Emergency Medicine | Admitting: Emergency Medicine

## 2019-05-31 DIAGNOSIS — R208 Other disturbances of skin sensation: Secondary | ICD-10-CM | POA: Insufficient documentation

## 2019-05-31 DIAGNOSIS — R519 Headache, unspecified: Secondary | ICD-10-CM | POA: Insufficient documentation

## 2019-05-31 DIAGNOSIS — Z87891 Personal history of nicotine dependence: Secondary | ICD-10-CM | POA: Insufficient documentation

## 2019-05-31 DIAGNOSIS — Z79899 Other long term (current) drug therapy: Secondary | ICD-10-CM | POA: Insufficient documentation

## 2019-05-31 DIAGNOSIS — R200 Anesthesia of skin: Secondary | ICD-10-CM | POA: Insufficient documentation

## 2019-05-31 LAB — BASIC METABOLIC PANEL
Anion gap: 10 (ref 5–15)
BUN: 11 mg/dL (ref 6–20)
CO2: 24 mmol/L (ref 22–32)
Calcium: 8.3 mg/dL — ABNORMAL LOW (ref 8.9–10.3)
Chloride: 103 mmol/L (ref 98–111)
Creatinine, Ser: 0.81 mg/dL (ref 0.44–1.00)
GFR calc Af Amer: >60 mL/min (ref >60)
GFR calc non Af Amer: >60 mL/min (ref >60)
Glucose, Bld: 96 mg/dL (ref 70–99)
Potassium: 3.4 mmol/L — ABNORMAL LOW (ref 3.5–5.1)
Sodium: 137 mmol/L (ref 135–145)

## 2019-05-31 LAB — CBC WITH DIFFERENTIAL/PLATELET
Abs Immature Granulocytes: 0.03 10*3/uL (ref 0.00–0.07)
Basophils Absolute: 0 10*3/uL (ref 0.0–0.1)
Basophils Relative: 0 %
Eosinophils Absolute: 0.2 10*3/uL (ref 0.0–0.5)
Eosinophils Relative: 2 %
HCT: 39.1 % (ref 36.0–46.0)
Hemoglobin: 12.7 g/dL (ref 12.0–15.0)
Immature Granulocytes: 0 %
Lymphocytes Relative: 21 %
Lymphs Abs: 2.3 10*3/uL (ref 0.7–4.0)
MCH: 28 pg (ref 26.0–34.0)
MCHC: 32.5 g/dL (ref 30.0–36.0)
MCV: 86.3 fL (ref 80.0–100.0)
Monocytes Absolute: 0.5 10*3/uL (ref 0.1–1.0)
Monocytes Relative: 4 %
Neutro Abs: 8.3 10*3/uL — ABNORMAL HIGH (ref 1.7–7.7)
Neutrophils Relative %: 73 %
Platelets: 340 10*3/uL (ref 150–400)
RBC: 4.53 MIL/uL (ref 3.87–5.11)
RDW: 13.4 % (ref 11.5–15.5)
WBC: 11.4 10*3/uL — ABNORMAL HIGH (ref 4.0–10.5)
nRBC: 0 % (ref 0.0–0.2)

## 2019-05-31 LAB — HCG, QUANTITATIVE, PREGNANCY: hCG, Beta Chain, Quant, S: <1 m[IU]/mL (ref <5)

## 2019-05-31 MED ORDER — GADOBUTROL 1 MMOL/ML IV SOLN
10.0000 mL | Freq: Once | INTRAVENOUS | Status: AC | PRN
  Administered 2019-05-31: 10 mL via INTRAVENOUS

## 2019-05-31 MED ORDER — DIPHENHYDRAMINE HCL 50 MG/ML IJ SOLN
25.0000 mg | Freq: Once | INTRAMUSCULAR | Status: AC
  Administered 2019-05-31: 22:00:00 25 mg via INTRAVENOUS
  Filled 2019-05-31: qty 1

## 2019-05-31 MED ORDER — PROCHLORPERAZINE EDISYLATE 10 MG/2ML IJ SOLN
10.0000 mg | Freq: Once | INTRAMUSCULAR | Status: AC
  Administered 2019-05-31: 22:00:00 10 mg via INTRAVENOUS
  Filled 2019-05-31: qty 2

## 2019-05-31 MED ORDER — SODIUM CHLORIDE 0.9 % IV BOLUS
1000.0000 mL | Freq: Once | INTRAVENOUS | Status: AC
  Administered 2019-05-31: 22:00:00 1000 mL via INTRAVENOUS

## 2019-05-31 MED ORDER — DEXAMETHASONE SODIUM PHOSPHATE 10 MG/ML IJ SOLN
10.0000 mg | Freq: Once | INTRAMUSCULAR | Status: AC
  Administered 2019-05-31: 22:00:00 10 mg via INTRAVENOUS
  Filled 2019-05-31: qty 1

## 2019-05-31 NOTE — ED Provider Notes (Signed)
[facility] Emergency Department Provider Note  ____________________________________________   First MD Initiated Contact with Patient 05/31/19 2109     (approximate)  I have reviewed the triage vital signs and the nursing notes.   HISTORY  Chief Complaint Headache    HPI Erin [name] is a 29 y.o. female  With h/o below here with headache. Pt has a long, documented h/o recent headaches and has recently been seen and treated by Dr. Lucia Gaskins. Her HA star occipitally and in her upper neck and radiate around her scalp, and are now associated with a burning, tingling sensation in the back of her head. She had injections by Dr. Lucia Gaskins last week w/o relief. Over the past several days, the pain has worsened and is now severe. She called Dr. Lucia Gaskins and was scheduled for MRI but due to ongoing pain, was told to come tot he ED. No fever, chills. No focal numbness, weakness. No specific alleviating factors despite trying analgesics, muscle relaxants, etc. No recent trauma.         Past Medical History:  Diagnosis Date  . ADD (attention deficit disorder)    ADHD  . Anxiety   . Asthma   . Chronic back pain   . Depression    postpartum depression in past  . Fatigue   . GERD (gastroesophageal reflux disease)   . Numbness and tingling   . Seasonal allergies   . Seasonal allergies   . Seizures (HCC)    during childhood    Patient Active Problem List   Diagnosis Date Noted  . Myofascial pain syndrome, cervical 05/26/2019  . Cervico-occipital neuralgia 05/26/2019  . Post-operative state 09/08/2015  . Amniotic fluid leaking 08/12/2015  . Nausea and vomiting in pregnancy 07/05/2015  . Family history of Down syndrome 03/16/2015  . Hyperemesis affecting pregnancy, antepartum 03/10/2015  . Cough 03/10/2015  . Viral respiratory illness 03/10/2015    Past Surgical History:  Procedure Laterality Date  . CESAREAN SECTION  2014  . CESAREAN SECTION WITH  BILATERAL TUBAL LIGATION Bilateral 09/08/2015   Procedure: CESAREAN SECTION WITH BILATERAL TUBAL LIGATION;  Surgeon: Suzy Bouchard, MD;  Location: [facility] ORS;  Service: Obstetrics;  Laterality: Bilateral;  . TONSILLECTOMY      Prior to Admission medications   Medication Sig Start Date End Date Taking? Authorizing Provider  albuterol (VENTOLIN HFA) 108 (90 Base) MCG/ACT inhaler Inhale 2 puffs into the lungs every 6 (six) hours as needed for wheezing or shortness of breath.    [provider]  buPROPion (WELLBUTRIN XL) 150 MG 24 hr tablet Take 150 mg by mouth daily.    [provider]  meloxicam (MOBIC) 15 MG tablet Take 15 mg by mouth daily.    [provider]  metaxalone (SKELAXIN) 800 MG tablet Take 800 mg by mouth 3 (three) times daily as needed for muscle spasms.    [provider]  tiZANidine (ZANAFLEX) 4 MG capsule Take 4 mg by mouth 3 (three) times daily as needed for muscle spasms.    [provider]  venlafaxine (EFFEXOR) 37.5 MG tablet Take 37.5 mg by mouth daily.    [provider]    Allergies Sulfa antibiotics and Sulfa drugs cross reactors  Family History  Problem Relation Age of Onset  . Hypertension Mother   . Arthritis Mother   . Cancer Mother   . Diabetes Mother   . Migraines Mother   . Cancer Father   . Diabetes Maternal Grandmother  Social History Social History   Tobacco Use  . Smoking status: Former Smoker    Packs/day: 0.10  . Smokeless tobacco: Never Used  Substance Use Topics  . Alcohol use: Yes    Comment: occasional   . Drug use: No    Comment: tussionex cough syrup x 1 week 03-16-15    Review of Systems  Review of Systems  Constitutional: Positive for fatigue. Negative for fever.  HENT: Negative for congestion and sore throat.   Eyes: Negative for visual disturbance.  Respiratory: Negative for cough and shortness of breath.   Cardiovascular: Negative for chest pain.    Gastrointestinal: Negative for abdominal pain, diarrhea, nausea and vomiting.  Genitourinary: Negative for flank pain.  Musculoskeletal: Negative for back pain and neck pain.  Skin: Negative for rash and wound.  Neurological: Positive for numbness (scalp) and headaches. Negative for weakness.     ____________________________________________  PHYSICAL EXAM:      VITAL SIGNS: ED Triage Vitals [05/31/19 1816]  Enc Vitals Group     BP (!) 150/99     Pulse Rate 91     Resp 16     Temp 98.5 F (36.9 C)     Temp Source Oral     SpO2 99 %     Weight 232 lb (105.2 kg)     Height 5\' 8"  (1.727 m)     Head Circumference      Peak Flow      Pain Score 8     Pain Loc      Pain Edu?      Excl. in GC?      Physical Exam Vitals and nursing note reviewed.  Constitutional:      General: She is not in acute distress.    Appearance: She is well-developed.  HENT:     Head: Normocephalic and atraumatic.     Comments: NCAT. No scalp swelling or lesions.Moderate posterior scalp TTP. Eyes:     Conjunctiva/sclera: Conjunctivae normal.  Cardiovascular:     Rate and Rhythm: Normal rate and regular rhythm.     Heart sounds: Normal heart sounds.  Pulmonary:     Effort: Pulmonary effort is normal. No respiratory distress.     Breath sounds: No wheezing.  Abdominal:     General: There is no distension.  Musculoskeletal:     Cervical back: Neck supple.  Skin:    General: Skin is warm.     Capillary Refill: Capillary refill takes less than 2 seconds.     Findings: No rash.  Neurological:     Mental Status: She is alert and oriented to person, place, and time.     Motor: No abnormal muscle tone.     Comments: Neurological Exam:  Mental Status: Alert and oriented to person, place, and time. Attention and concentration normal. Speech clear. Recent memory is intact. Cranial Nerves: Visual fields grossly intact. EOMI and PERRLA. No nystagmus noted. Facial sensation intact at forehead,  maxillary cheek, and chin/mandible bilaterally. No facial asymmetry or weakness. Hearing grossly normal. Uvula is midline, and palate elevates symmetrically. Normal SCM and trapezius strength. Tongue midline without fasciculations. Motor: Muscle strength 5/5 in proximal and distal UE and LE bilaterally. No pronator drift. Muscle tone normal. Reflexes: 2+ quads b/l  Sensation: Intact to light touch in upper and lower extremities distally bilaterally.  Gait: Normal without ataxia. Coordination: Normal FTN bilaterally.          ____________________________________________   LABS (all labs ordered are  listed, but only abnormal results are displayed)  Labs Reviewed  CBC WITH DIFFERENTIAL/PLATELET - Abnormal; Notable for the following components:      Result Value   WBC 11.4 (*)    Neutro Abs 8.3 (*)    All other components within normal limits  BASIC METABOLIC PANEL - Abnormal; Notable for the following components:   Potassium 3.4 (*)    Calcium 8.3 (*)    All other components within normal limits  HCG, QUANTITATIVE, PREGNANCY    ____________________________________________  EKG: None ________________________________________  RADIOLOGY All imaging, including plain films, CT scans, and ultrasounds, independently reviewed by me, and interpretations confirmed via formal radiology reads.  ED MD interpretation:   MRI: Normal MRI   Official radiology report(s): MR Brain W and Wo Contrast  Result Date: 05/31/2019 CLINICAL DATA:  Headache for 1 month EXAM: MRI HEAD WITHOUT AND WITH CONTRAST TECHNIQUE: Multiplanar, multiecho pulse sequences of the brain and surrounding structures were obtained without and with intravenous contrast. CONTRAST:  27mL GADAVIST GADOBUTROL 1 MMOL/ML IV SOLN COMPARISON:  None. FINDINGS: BRAIN: No acute infarct, acute hemorrhage or extra-axial collection. Normal white matter signal for age. Normal volume of brain parenchyma and CSF spaces. Midline structures  are normal. VASCULAR: Major flow voids are preserved. Susceptibility-sensitive sequences show no chronic microhemorrhage or superficial siderosis. SKULL AND UPPER CERVICAL SPINE: Normal calvarium and skull base. Visualized upper cervical spine and soft tissues are normal. SINUSES/ORBITS: No fluid levels or advanced mucosal thickening. No mastoid or middle ear effusion. Normal orbits. IMPRESSION: Normal brain MRI. Electronically Signed   By: Ulyses Jarred M.D.   On: 05/31/2019 22:56    ____________________________________________  PROCEDURES   Procedure(s) performed (including Critical Care):  Procedures  ____________________________________________  INITIAL IMPRESSION / MDM / [place] / ED COURSE  As part of my medical decision making, I reviewed the following data within the [place] notes reviewed and incorporated, Old chart reviewed, Notes from prior ED visits, and [place] Controlled Substance Database       *Erin [name] was evaluated in Emergency Department on 06/01/2019 for the symptoms described in the history of present illness. She was evaluated in the context of the global COVID-19 pandemic, which necessitated consideration that the patient might be at risk for infection with the SARS-CoV-2 virus that causes COVID-19. Institutional protocols and algorithms that pertain to the evaluation of patients at risk for COVID-19 are in a state of rapid change based on information released by regulatory bodies including the CDC and federal and state organizations. These policies and algorithms were followed during the patient's care in the ED.  Some ED evaluations and interventions may be delayed as a result of limited staffing during the pandemic.*     Medical Decision Making:  29 yo F here with acute on chronic HA, recently new in onset though seen previously by Neuro. No focal deficits on exam. No scalp lesions. She has had prior Ortho w/u for her  spine. Given severity of sx and new numbness, and based on recommendations from Neuro charts, MRI WWO obtained w/o acute abnormality. No signs of mass, lesions, demyelinating disease, or Chiari. Will advise outpt Neuro f/u and return precautions.  ____________________________________________  FINAL CLINICAL IMPRESSION(S) / ED DIAGNOSES  Final diagnoses:  Acute nonintractable headache, unspecified headache type     MEDICATIONS GIVEN DURING THIS VISIT:  Medications  prochlorperazine (COMPAZINE) injection 10 mg (10 mg Intravenous Given 05/31/19 2133)  diphenhydrAMINE (BENADRYL) injection 25 mg (25 mg  Intravenous Given 05/31/19 2134)  sodium chloride 0.9 % bolus 1,000 mL (0 mLs Intravenous Stopped 05/31/19 2254)  dexamethasone (DECADRON) injection 10 mg (10 mg Intravenous Given 05/31/19 2134)  gadobutrol (GADAVIST) 1 MMOL/ML injection 10 mL (10 mLs Intravenous Contrast Given 05/31/19 2245)     ED Discharge Orders    None       Note:  This document was prepared using Dragon voice recognition software and may include unintentional dictation errors.   Shaune Pollack, MD 06/01/19 [phone]

## 2019-05-31 NOTE — Telephone Encounter (Signed)
Spoke with pt to discuss if this is new or the pain she was having when she saw Dr. Lucia Gaskins last week. Pt stated prior she was having pain that was stabbing and throbbing in the L forehead. The headaches would get to the point where she couldn't stand up to do the dishes but now she says she has pressure when she stands up and her head feels like it is about to explode. She said she understands the plan but she says she has been out of work >1 month and she needs a quick fix as she has bills to pay and 2 young children. She has PT starting Wednesday. I spoke with Dr Lucia Gaskins who agrees that pt should go to the ED as she is reporting a headache that has changed from last week. I discussed with pt that unfortunately we do not have quick fixes in outpatient and MRI has been ordered. I advised her to go to the ED and that if she cannot even sit up we recommend she call 911. Pt said ok and said she would have someone watch her kids.

## 2019-05-31 NOTE — Telephone Encounter (Signed)
Pt called and stated that she is still having severe headaches and is barely able to sit up for long periods of time. She would like to know if there is something that can be given to her so that she can return to work. Please advise.

## 2019-05-31 NOTE — ED Triage Notes (Signed)
Patient reports headache x1 month. States on Wednesday, she began having sharp pressure in head. States she was seen by Neurology last Tuesday. Was given injections, however due to continued pain, she was sent to ED for possible MRI.

## 2019-06-02 ENCOUNTER — Other Ambulatory Visit: Payer: Self-pay

## 2019-06-02 ENCOUNTER — Ambulatory Visit: Payer: Commercial Managed Care - PPO | Admitting: Family Medicine

## 2019-06-02 ENCOUNTER — Ambulatory Visit: Payer: BC Managed Care – PPO | Attending: Neurology | Admitting: Physical Therapy

## 2019-06-02 ENCOUNTER — Encounter: Payer: Self-pay | Admitting: Physical Therapy

## 2019-06-02 ENCOUNTER — Encounter: Payer: Self-pay | Admitting: Family Medicine

## 2019-06-02 VITALS — BP 125/84 | HR 74 | Temp 97.3°F | Ht 68.0 in | Wt 232.0 lb

## 2019-06-02 DIAGNOSIS — R519 Headache, unspecified: Secondary | ICD-10-CM | POA: Insufficient documentation

## 2019-06-02 DIAGNOSIS — R202 Paresthesia of skin: Secondary | ICD-10-CM | POA: Insufficient documentation

## 2019-06-02 DIAGNOSIS — M542 Cervicalgia: Secondary | ICD-10-CM | POA: Diagnosis present

## 2019-06-02 DIAGNOSIS — M62838 Other muscle spasm: Secondary | ICD-10-CM | POA: Insufficient documentation

## 2019-06-02 MED ORDER — TOPIRAMATE 50 MG PO TABS: 50.0000 mg | ORAL_TABLET | Freq: Two times a day (BID) | ORAL | 2 refills | Status: DC

## 2019-06-02 MED ORDER — RIZATRIPTAN BENZOATE 10 MG PO TBDP: 10.0000 mg | ORAL_TABLET | ORAL | 11 refills | Status: AC | PRN

## 2019-06-02 NOTE — Patient Instructions (Signed)
We will start topiramate 50mg  at bedtime for 1 week, then increase to 50mg  twice daily  Stop Excedrin (take one tablet daily for 3-5 days then 1 tablet every other day for 3-5 days then stop)   Start rizatriptan for abortive therapy (1 tablet at onset of headache, may repeat 1 tablet in 2 hour if needed but no more than 2 in 24 hours or 10 per month)  Please drink 50 ounces of water daily  Well balanced diet and regular exercise  Follow up 3 months  Rizatriptan tablets What is this medicine? RIZATRIPTAN (rye za TRIP tan) is used to treat migraines with or without aura. An aura is a strange feeling or visual disturbance that warns you of an attack. It is not used to prevent migraines. This medicine may be used for other purposes; ask your health care provider or pharmacist if you have questions. COMMON BRAND NAME(S): Maxalt What should I tell my health care provider before I take this medicine? They need to know if you have any of these conditions:  cigarette smoker  circulation problems in fingers and toes  diabetes  heart disease  high blood pressure  high cholesterol  history of irregular heartbeat  history of stroke  kidney disease  liver disease  stomach or intestine problems  an unusual or allergic reaction to rizatriptan, other medicines, foods, dyes, or preservatives  pregnant or trying to get pregnant  breast-feeding How should I use this medicine? Take this medicine by mouth with a glass of water. Follow the directions on the prescription label. Do not take it more often than directed. Talk to your pediatrician regarding the use of this medicine in children. While this drug may be prescribed for children as young as 6 years for selected conditions, precautions do apply. Overdosage: If you think you have taken too much of this medicine contact a poison control center or emergency room at once. NOTE: This medicine is only for you. Do not share this  medicine with others. What if I miss a dose? This does not apply. This medicine is not for regular use. What may interact with this medicine? Do not take this medicine with any of the following medicines:  certain medicines for migraine headache like almotriptan, eletriptan, frovatriptan, naratriptan, rizatriptan, sumatriptan, zolmitriptan  ergot alkaloids like dihydroergotamine, ergonovine, ergotamine, methylergonovine  MAOIs like Carbex, Eldepryl, Marplan, Nardil, and Parnate This medicine may also interact with the following medications:  certain medicines for depression, anxiety, or psychotic disorders  propranolol This list may not describe all possible interactions. Give your health care provider a list of all the medicines, herbs, non-prescription drugs, or dietary supplements you use. Also tell them if you smoke, drink alcohol, or use illegal drugs. Some items may interact with your medicine. What should I watch for while using this medicine? Visit your healthcare professional for regular checks on your progress. Tell your healthcare professional if your symptoms do not start to get better or if they get worse. You may get drowsy or dizzy. Do not drive, use machinery, or do anything that needs mental alertness until you know how this medicine affects you. Do not stand up or sit up quickly, especially if you are an older patient. This reduces the risk of dizzy or fainting spells. Alcohol may interfere with the effect of this medicine. Your mouth may get dry. Chewing sugarless gum or sucking hard candy and drinking plenty of water may help. Contact your healthcare professional if the problem does  not go away or is severe. If you take migraine medicines for 10 or more days a month, your migraines may get worse. Keep a diary of headache days and medicine use. Contact your healthcare professional if your migraine attacks occur more frequently. What side effects may I notice from receiving  this medicine? Side effects that you should report to your doctor or health care professional as soon as possible:  allergic reactions like skin rash, itching or hives, swelling of the face, lips, or tongue  chest pain or chest tightness  signs and symptoms of a dangerous change in heartbeat or heart rhythm like chest pain; dizziness; fast, irregular heartbeat; palpitations; feeling faint or lightheaded; falls; breathing problems  signs and symptoms of a stroke like changes in vision; confusion; trouble speaking or understanding; severe headaches; sudden numbness or weakness of the face, arm or leg; trouble walking; dizziness; loss of balance or coordination  signs and symptoms of serotonin syndrome like irritable; confusion; diarrhea; fast or irregular heartbeat; muscle twitching; stiff muscles; trouble walking; sweating; high fever; seizures; chills; vomiting Side effects that usually do not require medical attention (report to your doctor or health care professional if they continue or are bothersome):  diarrhea  dizziness  drowsiness  dry mouth  headache  nausea, vomiting  pain, tingling, numbness in the hands or feet  stomach pain This list may not describe all possible side effects. Call your doctor for medical advice about side effects. You may report side effects to FDA at 1-800-FDA-1088. Where should I keep my medicine? Keep out of the reach of children. Store at room temperature between 15 and 30 degrees C (59 and 86 degrees F). Keep container tightly closed. Throw away any unused medicine after the expiration date. NOTE: This sheet is a summary. It may not cover all possible information. If you have questions about this medicine, talk to your doctor, pharmacist, or health care provider.  2020 Elsevier/Gold Standard (2017-10-28 14:59:59)   Topiramate tablets What is this medicine? TOPIRAMATE (toe PYRE a mate) is used to treat seizures in adults or children with  epilepsy. It is also used for the prevention of migraine headaches. This medicine may be used for other purposes; ask your health care provider or pharmacist if you have questions. COMMON BRAND NAME(S): Topamax, Topiragen What should I tell my health care provider before I take this medicine? They need to know if you have any of these conditions:  bleeding disorders  kidney disease  lung or breathing disease, like asthma  suicidal thoughts, plans, or attempt; a previous suicide attempt by you or a family member  an unusual or allergic reaction to topiramate, other medicines, foods, dyes, or preservatives  pregnant or trying to get pregnant  breast-feeding How should I use this medicine? Take this medicine by mouth with a glass of water. Follow the directions on the prescription label. Do not cut, crush or chew this medicine. Swallow the tablets whole. You can take it with or without food. If it upsets your stomach, take it with food. Take your medicine at regular intervals. Do not take it more often than directed. Do not stop taking except on your doctor's advice. A special MedGuide will be given to you by the pharmacist with each prescription and refill. Be sure to read this information carefully each time. Talk to your pediatrician regarding the use of this medicine in children. While this drug may be prescribed for children as young as 74 years of age for selected  conditions, precautions do apply. Overdosage: If you think you have taken too much of this medicine contact a poison control center or emergency room at once. NOTE: This medicine is only for you. Do not share this medicine with others. What if I miss a dose? If you miss a dose, take it as soon as you can. If your next dose is to be taken in less than 6 hours, then do not take the missed dose. Take the next dose at your regular time. Do not take double or extra doses. What may interact with this medicine? This medicine may  interact with the following medications:  acetazolamide  alcohol  antihistamines for allergy, cough, and cold  aspirin and aspirin-like medicines  atropine  birth control pills  certain medicines for anxiety or sleep  certain medicines for bladder problems like oxybutynin, tolterodine  certain medicines for depression like amitriptyline, fluoxetine, sertraline  certain medicines for seizures like carbamazepine, phenobarbital, phenytoin, primidone, valproic acid, zonisamide  certain medicines for stomach problems like dicyclomine, hyoscyamine  certain medicines for travel sickness like scopolamine  certain medicines for Parkinson's disease like benztropine, trihexyphenidyl  certain medicines that treat or prevent blood clots like warfarin, enoxaparin, dalteparin, apixaban, dabigatran, and rivaroxaban  digoxin  general anesthetics like halothane, isoflurane, methoxyflurane, propofol  hydrochlorothiazide  ipratropium  lithium  medicines that relax muscles for surgery  metformin  narcotic medicines for pain  NSAIDs, medicines for pain and inflammation, like ibuprofen or naproxen  phenothiazines like chlorpromazine, mesoridazine, prochlorperazine, thioridazine  pioglitazone This list may not describe all possible interactions. Give your health care provider a list of all the medicines, herbs, non-prescription drugs, or dietary supplements you use. Also tell them if you smoke, drink alcohol, or use illegal drugs. Some items may interact with your medicine. What should I watch for while using this medicine? Visit your doctor or health care professional for regular checks on your progress. Tell your health care professional if your symptoms do not start to get better or if they get worse. Do not stop taking except on your health care professional's advice. You may develop a severe reaction. Your health care professional will tell you how much medicine to take. Wear a  medical ID bracelet or chain. Carry a card that describes your disease and details of your medicine and dosage times. This medicine can reduce the response of your body to heat or cold. Dress warm in cold weather and stay hydrated in hot weather. If possible, avoid extreme temperatures like saunas, hot tubs, very hot or cold showers, or activities that can cause dehydration such as vigorous exercise. Check with your health care professional if you have severe diarrhea, nausea, and vomiting, or if you sweat a lot. The loss of too much body fluid may make it dangerous for you to take this medicine. You may get drowsy or dizzy. Do not drive, use machinery, or do anything that needs mental alertness until you know how this medicine affects you. Do not stand up or sit up quickly, especially if you are an older patient. This reduces the risk of dizzy or fainting spells. Alcohol may interfere with the effect of this medicine. Avoid alcoholic drinks. Tell your health care professional right away if you have any change in your eyesight. Patients and their families should watch out for new or worsening depression or thoughts of suicide. Also watch out for sudden changes in feelings such as feeling anxious, agitated, panicky, irritable, hostile, aggressive, impulsive, severely restless, overly excited  and hyperactive, or not being able to sleep. If this happens, especially at the beginning of treatment or after a change in dose, call your healthcare professional. This medicine may cause serious skin reactions. They can happen weeks to months after starting the medicine. Contact your health care provider right away if you notice fevers or flu-like symptoms with a rash. The rash may be red or purple and then turn into blisters or peeling of the skin. Or, you might notice a red rash with swelling of the face, lips or lymph nodes in your neck or under your arms. Birth control may not work properly while you are taking this  medicine. Talk to your health care professional about using an extra method of birth control. Women should inform their health care professional if they wish to become pregnant or think they might be pregnant. There is a potential for serious side effects and harm to an unborn child. Talk to your health care professional for more information. What side effects may I notice from receiving this medicine? Side effects that you should report to your doctor or health care professional as soon as possible:  allergic reactions like skin rash, itching or hives, swelling of the face, lips, or tongue  blood in the urine  changes in vision  confusion  loss of memory  pain in lower back or side  pain when urinating  redness, blistering, peeling or loosening of the skin, including inside the mouth  signs and symptoms of bleeding such as bloody or black, tarry stools; red or dark brown urine; spitting up blood or brown material that looks like coffee grounds; red spots on the skin; unusual bruising or bleeding from the eyes, gums, or nose  signs and symptoms of increased acid in the body like breathing fast; fast heartbeat; headache; confusion; unusually weak or tired; nausea, vomiting  suicidal thoughts, mood changes  trouble speaking or understanding  unusual sweating  unusually weak or tired Side effects that usually do not require medical attention (report to your doctor or health care professional if they continue or are bothersome):  dizziness  drowsiness  fever  loss of appetite  nausea, vomiting  pain, tingling, numbness in the hands or feet  stomach pain  tiredness  upset stomach This list may not describe all possible side effects. Call your doctor for medical advice about side effects. You may report side effects to FDA at 1-800-FDA-1088. Where should I keep my medicine? Keep out of the reach of children. Store at room temperature between 15 and 30 degrees C (59 and  86 degrees F). Throw away any unused medicine after the expiration date. NOTE: This sheet is a summary. It may not cover all possible information. If you have questions about this medicine, talk to your doctor, pharmacist, or health care provider.  2020 Elsevier/Gold Standard (2018-11-12 15:07:20)   Analgesic Rebound Headache An analgesic rebound headache, sometimes called a medication overuse headache, is a headache that comes after pain medicine (analgesic) taken to treat the original (primary) headache has worn off. Any type of primary headache can return as a rebound headache if a person regularly takes analgesics more than three times a week to treat it. The types of primary headaches that are commonly associated with rebound headaches include:  Migraines.  Headaches that arise from tense muscles in the head and neck area (tension headaches).  Headaches that develop and happen again (recur) on one side of the head and around the eye (cluster headaches).  If rebound headaches continue, they become chronic daily headaches. What are the causes? This condition may be caused by frequent use of:  Over-the-counter medicines such as aspirin, ibuprofen, and acetaminophen.  Sinus relief medicines and other medicines that contain caffeine.  Narcotic pain medicines such as codeine and oxycodone. What are the signs or symptoms? The symptoms of a rebound headache are the same as the symptoms of the original headache. Some of the symptoms of specific types of headaches include: Migraine headache  Pulsing or throbbing pain on one or both sides of the head.  Severe pain that interferes with daily activities.  Pain that is worsened by physical activity.  Nausea, vomiting, or both.  Pain with exposure to bright light, loud noises, or strong smells.  General sensitivity to bright light, loud noises, or strong smells.  Visual changes.  Numbness of one or both arms. Tension  headache  Pressure around the head.  Dull, aching head pain.  Pain felt over the front and sides of the head.  Tenderness in the muscles of the head, neck, and shoulders. Cluster headache  Severe pain that begins in or around one eye or temple.  Redness and tearing in the eye on the same side as the pain.  Droopy or swollen eyelid.  One-sided head pain.  Nausea.  Runny nose.  Sweaty, pale facial skin.  Restlessness. How is this diagnosed? This condition is diagnosed by:  Reviewing your medical history. This includes the nature of your primary headaches.  Reviewing the types of pain medicines that you have been using to treat your headaches and how often you take them. How is this treated? This condition may be treated or managed by:  Discontinuing frequent use of the analgesic medicine. Doing this may worsen your headaches at first, but the pain should eventually become more manageable, less frequent, and less severe.  Seeing a headache specialist. He or she may be able to help you manage your headaches and help make sure there is not another cause of the headaches.  Using methods of stress relief, such as acupuncture, counseling, biofeedback, and massage. Talk with your health care provider about which methods might be good for you. Follow these instructions at home:  Take over-the-counter and prescription medicines only as told by your health care provider.  Stop the repeated use of pain medicine as told by your health care provider. Stopping can be difficult. Carefully follow instructions from your health care provider.  Avoid triggers that are known to cause your primary headaches.  Keep all follow-up visits as told by your health care provider. This is important. Contact a health care provider if:  You continue to experience headaches after following treatments that your health care provider recommended. Get help right away if:  You develop new headache  pain.  You develop headache pain that is different than what you have experienced in the past.  You develop numbness or tingling in your arms or legs.  You develop changes in your speech or vision. This information is not intended to replace advice given to you by your health care provider. Make sure you discuss any questions you have with your health care provider. Document Revised: 03/28/2017 Document Reviewed: 09/18/2015 Elsevier Patient Education  2020 [employer].

## 2019-06-02 NOTE — Progress Notes (Addendum)
PATIENT: Erin [name] DOB: 1991/03/19  REASON FOR VISIT: follow up HISTORY FROM: patient  Chief Complaint  Patient presents with  . Follow-up    RM2. Alone. Was in the ER recently w/ a headache. States that she recieved an injection but its wearing off.      HISTORY OF PRESENT ILLNESS: Today 06/02/19 Erin [name] is a 29 y.o. female here today for follow up for headaches. She was seen as a new patient on 1/26. Dr Lucia Gaskins ordered PT/dry needling and suggested starting nortriptyline. Duwayne Heck wished to get MRI prior to starting medications. She reports that headache on 2/1 was so severe that she went to the ER for eval. MRI was normal. Migraine cocktail helped significantly. She reports taking 1-2 tablets of Excedrin everyday for headaches. She was recently started on Effexor 37.5mg by PCP. She also takes Wellbutrin 150mg  daily. She continues to see Dr for neck pain. She starts PT today.    HISTORY: (copied from Dr Ethelene Hal note on 05/25/2019)  HPI:  Erin [name] is a 29 y.o. female here as requested by 36, MD for headaches and facial pain. PMHx seizures, GERD, depression, asthma, chronic neck and low back pain.  Patient's been seen by Dr. Sheran Luz, I reviewed his notes, patient was seeing him for cervical spine pain, radiating up to the head, tightness, numbness in her face, using Skelaxin, PCP gave her venlafaxine, she had cervical completed with some likely muscle spasms due to straightening of the cervical lordosis but overall unremarkable for etiology open (I reviewed reports from neuroradiology), she works as a [employer] and she does not feel she can do that at this time, pain 7 or 8 out of 10 cervical MRI spine shows straightening of the cervical lordosis otherwise unremarkable.  She reports pain in the back of her head they start to hurt due to the tightness in her neck. Tightness in the muscles of the cervical spine. Pain starts (points to the  emergence of the occipital nerve) and then radiates up the back of he head to the temples, severe, shooting, brief. Her face feels numb, she also feels her lips get tingly with the pain. She feels drained when she has the pain. No focal weakness. No changes in bowel bladder. Being on the computer all day and turning can cause it. No hx of migraines. No vision changes. No nausea or vomiting. She has had this in the past but now worsening in frequency every day due to neck tightness. Tried muscle relaxers, heat. She even has a TENs unit. No other focal neurologic deficits, associated symptoms, inciting events or modifiable factors.  She has tried venlafaxine, meloxicam, Skelaxin, tizanidine.  Reviewed notes, labs and imaging from outside physicians, which showed:  CBC, CMP 12/2017 unremarkable, tsh 05/11/2019 normal 1.118,   I reviewed MRI cervical spine report which is largely normal except for some straightening of the cervical lordosis which can be due to muscle spasms, I will request CD with images to further review.    REVIEW OF SYSTEMS: Out of a complete 14 system review of symptoms, the patient complains only of the following symptoms, headaches, neck pain, numbness and tingling and all other reviewed systems are negative.  ALLERGIES: Allergies  Allergen Reactions  . Sulfa Antibiotics Hives  . Sulfa Drugs Cross Reactors Other (See Comments)    Uncontrollable body shaking.    HOME MEDICATIONS: Outpatient Medications Prior to Visit  Medication Sig Dispense Refill  . albuterol (VENTOLIN HFA) 108 (  90 Base) MCG/ACT inhaler Inhale 2 puffs into the lungs every 6 (six) hours as needed for wheezing or shortness of breath.    Marland Kitchen buPROPion (WELLBUTRIN XL) 150 MG 24 hr tablet Take 150 mg by mouth daily.    . meloxicam (MOBIC) 15 MG tablet Take 15 mg by mouth daily.    . metaxalone (SKELAXIN) 800 MG tablet Take 800 mg by mouth 3 (three) times daily as needed for muscle spasms.    Marland Kitchen tiZANidine  (ZANAFLEX) 4 MG capsule Take 4 mg by mouth 3 (three) times daily as needed for muscle spasms.    Marland Kitchen venlafaxine (EFFEXOR) 37.5 MG tablet Take 37.5 mg by mouth daily.     No facility-administered medications prior to visit.    PAST MEDICAL HISTORY: Past Medical History:  Diagnosis Date  . ADD (attention deficit disorder)    ADHD  . Anxiety   . Asthma   . Chronic back pain   . Depression    postpartum depression in past  . Fatigue   . GERD (gastroesophageal reflux disease)   . Numbness and tingling   . Seasonal allergies   . Seasonal allergies   . Seizures (HCC)    during childhood    PAST SURGICAL HISTORY: Past Surgical History:  Procedure Laterality Date  . CESAREAN SECTION  2014  . CESAREAN SECTION WITH BILATERAL TUBAL LIGATION Bilateral 09/08/2015   Procedure: CESAREAN SECTION WITH BILATERAL TUBAL LIGATION;  Surgeon: Suzy Bouchard, MD;  Location: [facility] ORS;  Service: Obstetrics;  Laterality: Bilateral;  . TONSILLECTOMY      FAMILY HISTORY: Family History  Problem Relation Age of Onset  . Hypertension Mother   . Arthritis Mother   . Cancer Mother   . Diabetes Mother   . Migraines Mother   . Cancer Father   . Diabetes Maternal Grandmother     SOCIAL HISTORY: Social History   Socioeconomic History  . Marital status: Single    Spouse name: Not on file  . Number of children: 2  . Years of education: Not on file  . Highest education level: Some college, no degree  Occupational History  . Not on file  Tobacco Use  . Smoking status: Former Smoker    Packs/day: 0.10  . Smokeless tobacco: Never Used  Substance and Sexual Activity  . Alcohol use: Yes    Comment: occasional   . Drug use: No    Comment: tussionex cough syrup x 1 week 03-16-15  . Sexual activity: Yes    Birth control/protection: None, Surgical  Other Topics Concern  . Not on file  Social History Narrative   Lives at home with her children   Right handed   Caffeine: maybe 3 cups a week    Social Determinants of Health   Financial Resource Strain:   . Difficulty of Paying Living Expenses: Not on file  Food Insecurity:   . Worried About Programme researcher, broadcasting/film/video in the Last Year: Not on file  . Ran Out of Food in the Last Year: Not on file  Transportation Needs:   . Lack of Transportation (Medical): Not on file  . Lack of Transportation (Non-Medical): Not on file  Physical Activity:   . Days of Exercise per Week: Not on file  . Minutes of Exercise per Session: Not on file  Stress:   . Feeling of Stress : Not on file  Social Connections:   . Frequency of Communication with Friends and Family: Not on file  .  Frequency of Social Gatherings with Friends and Family: Not on file  . Attends Religious Services: Not on file  . Active Member of Clubs or Organizations: Not on file  . Attends Banker Meetings: Not on file  . Marital Status: Not on file  Intimate Partner Violence:   . Fear of Current or Ex-Partner: Not on file  . Emotionally Abused: Not on file  . Physically Abused: Not on file  . Sexually Abused: Not on file    PHYSICAL EXAM  Vitals:   06/02/19 0810  BP: 125/84  Pulse: 74  Temp: (!) 97.3 F (36.3 C)  Weight: 232 lb (105.2 kg)  Height: 5\' 8"  (1.727 m)   Body mass index is 35.28 kg/m.  Generalized: Well developed, in no acute distress  Cardiology: normal rate and rhythm, no murmur noted Respiratory: clear to auscultation bilaterally  Neurological examination  Mentation: Alert oriented to time, place, history taking. Follows all commands speech and language fluent Cranial nerve II-XII: Pupils were equal round reactive to light. Extraocular movements were full, visual field were full Motor: The motor testing reveals 5 over 5 strength of all 4 extremities. Good symmetric motor tone is noted throughout.  Gait and station: Gait is normal.   DIAGNOSTIC DATA (LABS, IMAGING, TESTING) - I reviewed patient records, labs, notes, testing and  imaging myself where available.  No flowsheet data found.   Lab Results  Component Value Date   WBC 11.4 (H) 05/31/2019   HGB 12.7 05/31/2019   HCT 39.1 05/31/2019   MCV 86.3 05/31/2019   PLT 340 05/31/2019      Component Value Date/Time   NA 137 05/31/2019 2208   NA 137 08/18/2012 2048   K 3.4 (L) 05/31/2019 2208   K 3.6 08/18/2012 2048   CL 103 05/31/2019 2208   CL 105 08/18/2012 2048   CO2 24 05/31/2019 2208   CO2 24 08/18/2012 2048   GLUCOSE 96 05/31/2019 2208   GLUCOSE 95 08/18/2012 2048   BUN 11 05/31/2019 2208   BUN 9 08/18/2012 2048   CREATININE 0.81 05/31/2019 2208   CREATININE 0.55 (L) 08/18/2012 2048   CALCIUM 8.3 (L) 05/31/2019 2208   CALCIUM 8.3 (L) 08/18/2012 2048   PROT 7.7 01/26/2018 1545   PROT 7.1 08/18/2012 2048   ALBUMIN 4.0 01/26/2018 1545   ALBUMIN 2.4 (L) 08/18/2012 2048   AST 18 01/26/2018 1545   AST 19 08/18/2012 2048   ALT 18 01/26/2018 1545   ALT 20 08/18/2012 2048   ALKPHOS 62 01/26/2018 1545   ALKPHOS 161 (H) 08/18/2012 2048   BILITOT 0.5 01/26/2018 1545   BILITOT 0.2 08/18/2012 2048   GFRNONAA >60 05/31/2019 2208   GFRNONAA >60 08/18/2012 2048   GFRAA >60 05/31/2019 2208   GFRAA >60 08/18/2012 2048   No results found for: CHOL, HDL, LDLCALC, LDLDIRECT, TRIG, CHOLHDL No results found for: 2049 No results found for: VITAMINB12 Lab Results  Component Value Date   TSH 2.53 11/05/2011     ASSESSMENT AND PLAN 29 y.o. year old female  has a past medical history of ADD (attention deficit disorder), Anxiety, Asthma, Chronic back pain, Depression, Fatigue, GERD (gastroesophageal reflux disease), Numbness and tingling, Seasonal allergies, Seasonal allergies, and Seizures (HCC). here with     ICD-10-CM   1. Occipital headache  R51.9     36 continues to have persistent occipital headaches with some complex and migrainous features. MRI was normal. We have discussed several options for preventative therapy. She is  currently taking  Effexor and Wellbutrin. She is most comfortable starting topiramate. We will start 50mg  at bedtime for 1 week, then increase to twice daily dosing. We will also start rizatriptan 10mg  as needed for abortive therapy. She will wean Excedrin. We have discussed potential side effects of medications as well as concerns for rebound headaches. Additional education provided in AVS. Adequate hydration, well balanced diet and regular exercise advised. She will continue PT as directed. Follow up in 3 months, sooner if needed. She verbalizes understanding and agreement with this plan.    No orders of the defined types were placed in this encounter.    Meds ordered this encounter  Medications  . topiramate (TOPAMAX) 50 MG tablet    Sig: Take 1 tablet (50 mg total) by mouth 2 (two) times daily.    Dispense:  60 tablet    Refill:  2    Order Specific Question:   Supervising Provider    Answer:   Melvenia Beam [id number]  . rizatriptan (MAXALT-MLT) 10 MG disintegrating tablet    Sig: Take 1 tablet (10 mg total) by mouth as needed for migraine. May repeat in 2 hours if needed    Dispense:  9 tablet    Refill:  11    Order Specific Question:   Supervising Provider    Answer:   Melvenia Beam [id number]      I spent 20 minutes with the patient. 50% of this time was spent counseling and educating patient on plan of care and medications.    Debbora Presto, FNP-C 06/02/2019, 9:11 AM [facility] [address], [place], [place] [address] [phone]  Made any corrections needed, and agree with history, physical, neuro exam,assessment and plan as stated.     Sarina Ill, MD [facility]

## 2019-06-02 NOTE — Therapy (Signed)
[facility] PH[facility] AND SP[facility] MEDICINE [address] [place], [address] Phone: [phone]   Fax:  [phone]  Physical Therapy Evaluation  Patient Details  Name: Erin [name] MRN: [id number] Date of Birth: 05-19-1990 Referring Provider (PT): Anson Fret, MD   Encounter Date: 06/02/2019  PT End of Session - 06/02/19 1922    Visit Number  1    Number of Visits  12    Date for PT Re-Evaluation  08/25/19    Authorization Type  [employer] reporting period from 06/02/2019    Authorization - Visit Number  1    Authorization - Number of Visits  10    PT Start Time  1607    PT Stop Time  1707    PT Time Calculation (min)  60 min    Activity Tolerance  Patient tolerated treatment well    Behavior During Therapy  [facility] for tasks assessed/performed       Past Medical History:  Diagnosis Date   ADD (attention deficit disorder)    ADHD   Anxiety    Asthma    Chronic back pain    Depression    postpartum depression in past   Fatigue    GERD (gastroesophageal reflux disease)    Numbness and tingling    Seasonal allergies    Seasonal allergies    Seizures (HCC)    during childhood    Past Surgical History:  Procedure Laterality Date   CESAREAN SECTION  2014   CESAREAN SECTION WITH BILATERAL TUBAL LIGATION Bilateral 09/08/2015   Procedure: CESAREAN SECTION WITH BILATERAL TUBAL LIGATION;  Surgeon: Suzy Bouchard, MD;  Location: [facility] ORS;  Service: Obstetrics;  Laterality: Bilateral;   TONSILLECTOMY      There were no vitals filed for this visit.   Subjective Assessment - 06/02/19 1623    Subjective  Patient states back in December she started having issues with her upper back and neck and they caused headaches. It is not isolated in one spot, it will be her forehead, either temple, can feel like pressure, or burning sensation. It is not consistent. She always feels some time of pai or tightness. She is  unsure if it affects her vision or if she just needs new glasses. She had some similar symptoms over the summer (maybe one week). Denies any major life events or accidents/trauma near onset. Sometimes she gets numbness and tingling in the face, and the left side of upper back. Denies symptoms down arms (did before but was about 1-2 weeks ago and was one random time after she had been driving to the doctor). Pain is constant, got worse on Monday and went to ER and she was given something that helped. She recently returned to Dr. Trevor Mace office (earlier today) who updated her medications. Does not notice any double vision but does have astigmatism. Intense headaches multiple times a week. Pops neck a lot trying to get relief. TENS unit helps a little bit. Sometimes sensitive to light. Denies jaw problems. States she had two rounds of injections into neck joints. First was 2 weeks ago and did not help. 2nd was on Monday at ED and helped for a day or so and contained steroid.  Denies stumbling or dropping things.    Pertinent History  Patient is a 29 y.o. female who presents to outpatient physical therapy with a referral for medical diagnosis bilateral occipital neuralgia, cervical myofascial pian syndrome. This patient's chief complaints consist of  constant headache and neck pain with intermittent paresthesia in the face that started in December 2020, leading to the following functional deficits: slows her down, she pushes through a lot of it, finds it hard to sit without support, keeps her up at night, has had to cut back on work, bending, lifting, difficulty caring for 2 small children, interferes with crafting hobby ,working as a Lawyer, traveling, driving, turing the head, computer work. Relevant past medical history and comorbidities include ADHD, anxiety, asthma, chronic back pain, GERD, seizures (childhood), depression (well managed per pt), hx of c-section and B tubal ligation. Denies hx of stroke, diabetes,  cardiac events, cancer. Denies spinal surgeries, jaw problems.    Limitations  Sitting;Reading;House hold activities;Lifting;Other (comment)   keeps her up at night, has had to cut back on work, bending, lifting, difficulty caring for 2 small children, interferes with crafting hobby, traveling, driving, turing the head, computer work.   How long can you sit comfortably?  20 minutes    Diagnostic tests  cerivcal spine and head MRI normal except some straightening of cervical lordosis (performed after onset of symptoms).    Patient Stated Goals  to get back to being normal and not have any pain at all or can tolerate it without slowing her down or stopping her from doing her normal things.    Currently in Pain?  Yes    Pain Score  6    worst: 10/10; best: 4/10   Pain Location  Neck   headache   Pain Orientation  Right;Left;Anterior;Posterior    Pain Descriptors / Indicators  Tingling;Numbness;Burning;Sharp;Stabbing;Tightness;Pounding    Pain Type  Acute pain    Pain Radiating Towards  head and face; face (no particular place)    Pain Onset  More than a month ago    Pain Frequency  Constant    Aggravating Factors   sitting too long, bending, lifting, caring for children, working, turning head, driving, looking down for too long.    Pain Relieving Factors  medications, lying down, popping sometimes, TENS unit some.    Effect of Pain on Daily Activities  Functional limitations: slows her down, she pushes through a lot of it, finds it hard to sit without support, keeps her up at night, has had to cut back on work, bending, lifting, difficulty caring for 2 small children, interferes with crafting hobby, traveling, driving, turing the head, computer work.         [facility] PT Assessment - 06/02/19 0001      Assessment   Medical Diagnosis  bilateral occipital neuralgia, cervical myofascial pain syndrome    Referring Provider (PT)  Anson Fret, MD    Onset Date/Surgical Date  05/02/19    Hand  Dominance  Right    Next MD Visit  in three months    Prior Therapy  none for this condition prior to current epiosde of care      Precautions   Precautions  None      Restrictions   Weight Bearing Restrictions  No      Balance Screen   Has the patient fallen in the past 6 months  No    Has the patient had a decrease in activity level because of a fear of falling?   No    Is the patient reluctant to leave their home because of a fear of falling?   No      Home Environment   Living Environment  --   no concerns  about home environment and getting around it     Prior Function   Level of Independence  Independent    Vocation  Part time employment    Vocation Requirements  CNA (transferring patients, standing, walking, computer work).     Leisure  crafting      Cognition   Overall Cognitive Status  Within Functional Limits for tasks assessed      Observation/Other Assessments   Observations  see note from 06/02/2019 for latest objective data    Focus on Therapeutic Outcomes (FOTO)   FOTO = 34       OBJECTIVE  OBSERVATION/INSPECTION  Posture: forward head, rounded shoulders, slumped in sitting.   Tremor: none  Bed mobility: supine <> sit I with pain  Transfers: sit <> stand I with pain.    Gait: grossly WFL for household and short community ambulation. More detailed gait analysis deferred to later date as needed.   NEUROLOGICAL Upper Motor Neuron Screen Hoffman's, and Clonus (ankle) negative bilaterally Dermatomes  C3-T1 appears equal and intact to light touch except C3 on the right feels duller. C4-C5 on left feels duller.  Myotomes  C5-T1 appears intact Deep Tendon Reflexes R/L   0+/2+ Biceps brachii reflex (C5, C6)  0+/0+ Triceps brachii reflex (C7) Neurodynamic Tests for UE: R = positive at median and less so ulnar, left median (performed in supine PROM), Left median with opposite reaction to sensitizing maneuver (cervical sidebending). R side positive for  both nerves to sensitizing maneuver (cervical sidebending and wrist motion).   SPINE MOTION Cervical Spine AROM *Indicates pain Flexion: = 50 sharpness in the middle of neck Extension: = 55 didn't hurt as bad but similar tightness.  Rotation: R= 64  Slight R pain, L = 64 (slight left pain) Side Flexion: R= 50, L = 40. (contralateral tightness and pulling each way)  PERIPHERAL JOINT MOTION (in degrees) Comments: BUE AROM WFL  MUSCLE PERFORMANCE (MMT):  Comments: BUE at least 4/+/5 overall except right shoulder ER 4/5. Mild discomfort for most movements.   REPEATED MOTIONS TESTING: Motion/Technique sets x reps During After Stoplight  Repeated cervical retraction in sitting x10 Feels very tight, starts to tingle in left upper back peripheralized to left upper back red   SPECIAL TESTS: Cervical axial distraction = positive (left upper back tingling relieved slightly) Cervical axial compression = negative Spurling's test: R = positive for left upper back tingling; L = negative (see above for neurodynamic testing)  ACCESSORY MOTION:  - CPA over upper thoracic spine: hypomobile and tender, reproducing upper back pain - CPA over upper, mid, and lower cervical spine: very tender with reproduction of head. - Assessment induced lasting increase in pain due to level of tenderness.   PALPATION: - TTP globally at posterior neck musculature including cervical and upper thoracic paraspinals, upper traps, levator scap, suboccipitals. Tightness with muscle spasm palpated in same regions. - Very sharp transition from kyphosis to lordosis at CT junction.   FUNCTIONAL MOBILITY: - Bed mobility: supine <> sit I with pain - Transfers: sit <> stand I with pain.  EDUCATION/COGNITION: Patient is alert and oriented X 4.   Objective measurements completed on examination: See above findings.    TREATMENT:  Therapeutic exercise: to centralize symptoms and improve ROM, strength, muscular endurance, and  activity tolerance required for successful completion of functional activities.  - supine chin tuck with deep cervical spine flexor muscle activation over SCM. X 10 with 5 second hold practiced on pillow and then with towel roll  behind occiput to improve cervical spine posture and stretch posterior upper cervical musculature.  - Education on HEP including handout  - Education on diagnosis, prognosis, POC, dry needling benefits and risks, anatomy and physiology of current condition.   Manual therapy: to reduce pain and tissue tension, improve range of motion, neuromodulation, in order to promote improved ability to complete functional activities. - supine STM to posterior cervical spine including suboccipital muscles, cervical paraspinals, bilateral upper traps and levator scap, mild trigger point release to suboccipitals. STM to B anterior SCM.  - gentle manual cervical distraction in supine position intermixed during STM for pain control  Pt required multimodal cuing for proper technique and to facilitate improved neuromuscular control, strength, range of motion, and functional ability resulting in improved performance and form. Patient reported manual therapy was uncomfortable but decreased sensation of tightness following.    HOME EXERCISE PROGRAM         PT Education - 06/02/19 1922    Education Details  Exercise purpose/form. Self management techniques. Education on diagnosis, prognosis, POC, anatomy and physiology of current condition Education on HEP including handout    Person(s) Educated  Patient    Methods  Explanation;Demonstration;Tactile cues;Verbal cues;Handout    Comprehension  Verbalized understanding;Returned demonstration;Verbal cues required;Need further instruction;Tactile cues required       PT Short Term Goals - 06/02/19 1924      PT SHORT TERM GOAL #1   Title  Be independent with initial home exercise program for self-management of symptoms.    Baseline  initial  HEP provided at IE (06/02/2019);    Time  2    Period  Weeks    Status  New    Target Date  06/16/19        PT Long Term Goals - 06/02/19 1925      PT LONG TERM GOAL #1   Title  Be independent with a long-term home exercise program for self-management of symptoms.    Baseline  Initial HEP provided at IE (06/02/2019);    Time  6    Period  Weeks    Status  New    Target Date  07/14/19      PT LONG TERM GOAL #2   Title  Reduce pain with functional activities to equal or less than 1/10 to allow patient to complete usual activities including ADLs, IADLs, and social engagement with less difficulty.    Baseline  up to 10/10 (06/02/2019);    Time  6    Period  Weeks    Status  New    Target Date  07/14/19      PT LONG TERM GOAL #3   Title  Have full cervical spine AROM with no compensations or increase in pain in all planes except intermittent end range discomfort to allow patient to complete valued activities with less difficulty.    Baseline  limited and painful - see objective exam (06/02/2019);    Time  6    Period  Weeks    Status  New    Target Date  07/14/19      PT LONG TERM GOAL #4   Title  Demonstrate improved FOTO score equal or greater than 58 to demonstrate improvement in overall condition and self-reported functional ability.    Baseline  34 (06/02/2019);    Time  6    Period  Weeks    Status  New    Target Date  07/14/19      PT  LONG TERM GOAL #5   Title  Complete community, work and/or recreational activities without limitation due to current condition.    Baseline  limited ability to complete her usual activities including sitting without support, sleeping, working as a LawyerCNA, bending, lifting, caring for 2 small children, crafting, traveling, driving, turning head, computer work, etc (06/02/2019);    Time  6    Period  Weeks    Status  New    Target Date  07/14/19             Plan - 06/02/19 1729    Clinical Impression Statement  Patient is a 29 y.o. female  referred to outpatient physical therapy with a medical diagnosis of bilateral occipital neuralgia, cervical myofascial pain syndrome who presents with signs and symptoms consistent with recent insidious onset constant varied headache and neck/upper back pain, bilateral median neural tension, R ulnar nerve tension, and intermittent facial paresthesia. Patient presents with significant joint stiffness, muscle shortness, spasm, ROM, postural, pain, muscle performance (strength/endurance/power)  impairments that are limiting ability to complete her usual activities including sitting without support, sleeping, working as a LawyerCNA, bending, lifting, caring for 2 small children, crafting, traveling, driving, turning head, computer work, etc, without difficulty and decreases her quality of life. Patient will benefit from skilled physical therapy intervention to address current body structure impairments and activity limitations to improve function and work towards goals set in current POC in order to return to prior level of function or maximal functional improvement.    Personal Factors and Comorbidities  Comorbidity 3+;Profession;Fitness;Past/Current Experience    Comorbidities  Relevant past medical history and comorbidities include ADHD, anxiety, asthma, chronic back pain, GERD, seizures (childhood), depression (well managed per pt), hx of c-section and B tubal ligation.    Examination-Activity Limitations  Dressing;Sit;Sleep;Caring for Others;Carry;Reach Overhead;Lift;Squat;Bend;Other   activities including sitting without support, sleeping, working as a CNA, bending, lifting, caring for 2 small children, crafting, traveling, driving, turning head, computer work, Catering manageretc, without difficulty   Examination-Participation Restrictions  Cleaning;Community Activity;Laundry;Interpersonal Relationship;Yard Work;Driving;Meal Prep   work, caring for children   Stability/Clinical Decision Making  Evolving/Moderate complexity     Clinical Decision Making  Moderate    Rehab Potential  Good    PT Frequency  2x / week    PT Duration  6 weeks    PT Treatment/Interventions  ADLs/Self Care Home Management;Aquatic Therapy;Biofeedback;Cryotherapy;Moist Heat;Electrical Stimulation;Traction;Therapeutic activities;Therapeutic exercise;Neuromuscular re-education;Patient/family education;Manual techniques;Passive range of motion;Dry needling;Taping;Spinal Manipulations;Joint Manipulations    PT Next Visit Plan  dry needling    PT Home Exercise Plan  supine chin tuck x 20 twice a day    Consulted and Agree with Plan of Care  Patient       Patient will benefit from skilled therapeutic intervention in order to improve the following deficits and impairments:  Decreased endurance, Decreased mobility, Hypomobility, Increased muscle spasms, Impaired sensation, Impaired perceived functional ability, Decreased range of motion, Decreased activity tolerance, Decreased strength, Increased fascial restricitons, Impaired flexibility, Postural dysfunction, Pain  Visit Diagnosis: Nonintractable headache, unspecified chronicity pattern, unspecified headache type  Cervicalgia  Paresthesia of skin  Other muscle spasm     Problem List Patient Active Problem List   Diagnosis Date Noted   Myofascial pain syndrome, cervical 05/26/2019   Cervico-occipital neuralgia 05/26/2019   Post-operative state 09/08/2015   Amniotic fluid leaking 08/12/2015   Nausea and vomiting in pregnancy 07/05/2015   Family history of Down syndrome 03/16/2015   Hyperemesis affecting pregnancy, antepartum 03/10/2015   Cough 03/10/2015  Viral respiratory illness 03/10/2015   Luretha Murphy. Ilsa Iha, PT, DPT 06/02/19, 7:34 PM  [facility] PHYS[facility] AND SPORTS MEDICINE [address] [place], [address] Phone: [phone]   Fax:  [phone]  Name: Erin [name] MRN: [id number] Date of Birth: [birth year]

## 2019-06-07 ENCOUNTER — Ambulatory Visit: Payer: BC Managed Care – PPO | Admitting: Physical Therapy

## 2019-06-07 ENCOUNTER — Telehealth: Payer: Self-pay | Admitting: Family Medicine

## 2019-06-07 NOTE — Telephone Encounter (Signed)
Pt has called to report that since this weekend as a result of the metaxalone (SKELAXIN) 800 MG tablet she has been experiencing tingling and numbness in her left hand.  Pt is asking for a call to discuss

## 2019-06-07 NOTE — Telephone Encounter (Signed)
I am so happy the topiramate is helping. Does she feel that Skelaxin is causing these side effects or topiramate? Sometimes it takes a few days to get used to taking topiramate but if she feels that symptoms are worsening or not tolerable, she should discontinue. I am not certain who started the Skelaxin. She should contact the provider who gave that medication is she is concerned about possible side effects.

## 2019-06-07 NOTE — Telephone Encounter (Signed)
I called pt and she has tingling numbness, heaviness feeling L fingers/hand since Saturday on 50mg  po qhs. Blurriess as well.  She states that the topiramate has helped her migraines, not as severe or having as many.  Please advise.

## 2019-06-08 NOTE — Telephone Encounter (Signed)
I called and LMVM for her that if she can tolerate the SE then keep on for 6-8 wks and if not then call and we can wean off topamax.  At RV can discuss other alternatives.  I will wait to hear back from her if needed.

## 2019-06-08 NOTE — Telephone Encounter (Signed)
When I called pt, she was not taking the skelexin, it was the topiramate.  How long would you have her stay on this before changing, and if stopped what would you try then?

## 2019-06-08 NOTE — Telephone Encounter (Signed)
If she can tolerate side effects I recommend she try for at least 6-8 weeks. We can discuss other alternatives when we see her back. If she continues to have intolerable side effects have her call and we will wean her off medication.

## 2019-06-09 ENCOUNTER — Encounter: Payer: Commercial Managed Care - PPO | Admitting: Physical Therapy

## 2019-06-10 ENCOUNTER — Ambulatory Visit: Payer: BC Managed Care – PPO | Admitting: Physical Therapy

## 2019-06-14 ENCOUNTER — Encounter: Payer: Commercial Managed Care - PPO | Admitting: Physical Therapy

## 2019-06-15 ENCOUNTER — Encounter: Payer: Self-pay | Admitting: Physical Therapy

## 2019-06-15 ENCOUNTER — Ambulatory Visit: Payer: BC Managed Care – PPO | Admitting: Physical Therapy

## 2019-06-15 ENCOUNTER — Other Ambulatory Visit: Payer: Self-pay

## 2019-06-15 DIAGNOSIS — R519 Headache, unspecified: Secondary | ICD-10-CM | POA: Diagnosis not present

## 2019-06-15 DIAGNOSIS — M62838 Other muscle spasm: Secondary | ICD-10-CM

## 2019-06-15 DIAGNOSIS — M542 Cervicalgia: Secondary | ICD-10-CM

## 2019-06-15 DIAGNOSIS — R202 Paresthesia of skin: Secondary | ICD-10-CM

## 2019-06-15 NOTE — Therapy (Signed)
[facility] [address], [place], [address] Phone: [phone]   Fax:  [phone]  Physical Therapy Treatment  Patient Details  Name: Amerika Nourse MRN: [id number] Date of Birth: [birth year] Referring Provider (PT): Anson Fret, MD   Encounter Date: 06/15/2019  PT End of Session - 06/15/19 1733    Visit Number  2    Number of Visits  12    Authorization - Visit Number  2    Authorization - Number of Visits  10    PT Start Time  0450    PT Stop Time  0530    PT Time Calculation (min)  40 min    Activity Tolerance  Patient tolerated treatment well    Behavior During Therapy  [facility] for tasks assessed/performed       Past Medical History:  Diagnosis Date  . ADD (attention deficit disorder)    ADHD  . Anxiety   . Asthma   . Chronic back pain   . Depression    postpartum depression in past  . Fatigue   . GERD (gastroesophageal reflux disease)   . Numbness and tingling   . Seasonal allergies   . Seasonal allergies   . Seizures (HCC)    during childhood    Past Surgical History:  Procedure Laterality Date  . CESAREAN SECTION  2014  . CESAREAN SECTION WITH BILATERAL TUBAL LIGATION Bilateral 09/08/2015   Procedure: CESAREAN SECTION WITH BILATERAL TUBAL LIGATION;  Surgeon: Suzy Bouchard, MD;  Location: [facility] ORS;  Service: Obstetrics;  Laterality: Bilateral;  . TONSILLECTOMY      There were no vitals filed for this visit.  Subjective Assessment - 06/15/19 1654    Subjective  Patient reports she has been feeling a little better since starting a new medication a couple weeks ago ( topmirate). Reports her pain has been between a 0-6 on and off. She is noticing tension headaches that come on with cleaning activities where she is pushing/pulling with UEs.    Pertinent History  Patient is a 29 y.o. female who presents to outpatient physical therapy with a referral for medical diagnosis  bilateral occipital neuralgia, cervical myofascial pian syndrome. This patient's chief complaints consist of constant headache and neck pain with intermittent paresthesia in the face that started in December 2020, leading to the following functional deficits: slows her down, she pushes through a lot of it, finds it hard to sit without support, keeps her up at night, has had to cut back on work, bending, lifting, difficulty caring for 2 small children, interferes with crafting hobby ,working as a Lawyer, traveling, driving, turing the head, computer work. Relevant past medical history and comorbidities include ADHD, anxiety, asthma, chronic back pain, GERD, seizures (childhood), depression (well managed per pt), hx of c-section and B tubal ligation. Denies hx of stroke, diabetes, cardiac events, cancer. Denies spinal surgeries, jaw problems.    Limitations  Sitting;Reading;House hold activities;Lifting;Other (comment)    How long can you sit comfortably?  20 minutes    Diagnostic tests  cerivcal spine and head MRI normal except some straightening of cervical lordosis (performed after onset of symptoms).    Patient Stated Goals  to get back to being normal and not have any pain at all or can tolerate it without slowing her down or stopping her from doing her normal things.    Currently in Pain?  Yes  Manual STM with trigger point release to bilat UT, suboccipitals, and levator  Dry Needling: (2/2/2) 44mm .30 needles placed along the R Biceps to decrease increased muscular spasms and trigger points with the patient positioned in supine. Patient was educated on risks and benefits of therapy and verbally consents to PT.  Manual traction 10sec on 10sec off x10    Ther-Ex Diaphragmatic breathing x1 min with cuing for hand on chest and belly trying to make belly hand move, good carry over Chin tuck into towel supine 10x 5sec hold HEP review, min correction needed, good carry over Chin tuck + small  lift from table 10x 5sec hold Prone T 2x 10 with maintained chin tuck with cuing initially for scapular retraction with good carry over, fairly consistent cuing needed to maintain chin tuck Prone I 2x 10 with maintained chin tuck, TC for scapular depression bilat with good carry over                        PT Education - 06/15/19 1733    Education Details  TDN, therex technique, posture    Person(s) Educated  Patient    Methods  Explanation;Demonstration;Tactile cues;Verbal cues    Comprehension  Verbalized understanding;Returned demonstration;Verbal cues required;Tactile cues required       PT Short Term Goals - 06/02/19 1924      PT SHORT TERM GOAL #1   Title  Be independent with initial home exercise program for self-management of symptoms.    Baseline  initial HEP provided at IE (06/02/2019);    Time  2    Period  Weeks    Status  New    Target Date  06/16/19        PT Long Term Goals - 06/02/19 1925      PT LONG TERM GOAL #1   Title  Be independent with a long-term home exercise program for self-management of symptoms.    Baseline  Initial HEP provided at IE (06/02/2019);    Time  6    Period  Weeks    Status  New    Target Date  07/14/19      PT LONG TERM GOAL #2   Title  Reduce pain with functional activities to equal or less than 1/10 to allow patient to complete usual activities including ADLs, IADLs, and social engagement with less difficulty.    Baseline  up to 10/10 (06/02/2019);    Time  6    Period  Weeks    Status  New    Target Date  07/14/19      PT LONG TERM GOAL #3   Title  Have full cervical spine AROM with no compensations or increase in pain in all planes except intermittent end range discomfort to allow patient to complete valued activities with less difficulty.    Baseline  limited and painful - see objective exam (06/02/2019);    Time  6    Period  Weeks    Status  New    Target Date  07/14/19      PT LONG TERM GOAL #4   Title   Demonstrate improved FOTO score equal or greater than 58 to demonstrate improvement in overall condition and self-reported functional ability.    Baseline  34 (06/02/2019);    Time  6    Period  Weeks    Status  New    Target Date  07/14/19      PT LONG  TERM GOAL #5   Title  Complete community, work and/or recreational activities without limitation due to current condition.    Baseline  limited ability to complete her usual activities including sitting without support, sleeping, working as a Lawyer, bending, lifting, caring for 2 small children, crafting, traveling, driving, turning head, computer work, etc (06/02/2019);    Time  6    Period  Weeks    Status  New    Target Date  07/14/19            Plan - 06/15/19 1730    Clinical Impression Statement  PT utilized TDN and manual techniques to decrease pain and muscle tension with good success, noted localized twitch response. PT utilized therex for postural stabilization, and to restore neutral length tension relationships with patient requiring multimodial cuing for technique/posture, with good carry over and motivation. PT will continue progression as able.    Personal Factors and Comorbidities  Comorbidity 3+;Profession;Fitness;Past/Current Experience    Comorbidities  Relevant past medical history and comorbidities include ADHD, anxiety, asthma, chronic back pain, GERD, seizures (childhood), depression (well managed per pt), hx of c-section and B tubal ligation.    Examination-Activity Limitations  Dressing;Sit;Sleep;Caring for Others;Carry;Reach Overhead;Lift;Squat;Bend;Other    Stability/Clinical Decision Making  Evolving/Moderate complexity    Clinical Decision Making  Moderate    Rehab Potential  Good    PT Frequency  2x / week    PT Duration  6 weeks    PT Treatment/Interventions  ADLs/Self Care Home Management;Aquatic Therapy;Biofeedback;Cryotherapy;Moist Heat;Electrical Stimulation;Traction;Therapeutic activities;Therapeutic  exercise;Neuromuscular re-education;Patient/family education;Manual techniques;Passive range of motion;Dry needling;Taping;Spinal Manipulations;Joint Manipulations    PT Next Visit Plan  dry needling    PT Home Exercise Plan  supine chin tuck x 20 twice a day    Consulted and Agree with Plan of Care  Patient       Patient will benefit from skilled therapeutic intervention in order to improve the following deficits and impairments:  Decreased endurance, Decreased mobility, Hypomobility, Increased muscle spasms, Impaired sensation, Impaired perceived functional ability, Decreased range of motion, Decreased activity tolerance, Decreased strength, Increased fascial restricitons, Impaired flexibility, Postural dysfunction, Pain  Visit Diagnosis: Nonintractable headache, unspecified chronicity pattern, unspecified headache type  Cervicalgia  Paresthesia of skin  Other muscle spasm     Problem List Patient Active Problem List   Diagnosis Date Noted  . Myofascial pain syndrome, cervical 05/26/2019  . Cervico-occipital neuralgia 05/26/2019  . Post-operative state 09/08/2015  . Amniotic fluid leaking 08/12/2015  . Nausea and vomiting in pregnancy 07/05/2015  . Family history of Down syndrome 03/16/2015  . Hyperemesis affecting pregnancy, antepartum 03/10/2015  . Cough 03/10/2015  . Viral respiratory illness 03/10/2015   Staci Acosta PT, DPT Staci Acosta 06/15/2019, 5:34 PM  [facility] SPORTS MEDICINE [address], [place], [address] Phone: [phone]   Fax:  [phone]  Name: Chera Slivka MRN: [id number] Date of Birth: February 13, 1991

## 2019-06-16 ENCOUNTER — Encounter: Payer: Commercial Managed Care - PPO | Admitting: Physical Therapy

## 2019-06-17 ENCOUNTER — Ambulatory Visit: Payer: BC Managed Care – PPO | Admitting: Physical Therapy

## 2019-06-21 ENCOUNTER — Encounter: Payer: Commercial Managed Care - PPO | Admitting: Physical Therapy

## 2019-06-22 ENCOUNTER — Ambulatory Visit: Payer: BC Managed Care – PPO | Admitting: Physical Therapy

## 2019-06-22 ENCOUNTER — Encounter: Payer: Self-pay | Admitting: Physical Therapy

## 2019-06-22 ENCOUNTER — Other Ambulatory Visit: Payer: Self-pay

## 2019-06-22 DIAGNOSIS — M542 Cervicalgia: Secondary | ICD-10-CM

## 2019-06-22 DIAGNOSIS — R519 Headache, unspecified: Secondary | ICD-10-CM | POA: Diagnosis not present

## 2019-06-22 NOTE — Therapy (Signed)
[facility] [address] [place], [address] Phone: [phone]   Fax:  [phone]  Physical Therapy Treatment  Patient Details  Name: Erin [name] MRN: [id number] Date of Birth: [birth year] Referring Provider (PT): Anson Fret, MD   Encounter Date: 06/22/2019  PT End of Session - 06/22/19 1728    Visit Number  3    Number of Visits  12    Date for PT Re-Evaluation  08/25/19    Authorization Type  [employer] reporting period from 06/02/2019    Authorization - Visit Number  3    Authorization - Number of Visits  10    PT Start Time  0449    PT Stop Time  0530    PT Time Calculation (min)  41 min    Activity Tolerance  Patient tolerated treatment well    Behavior During Therapy  [facility] for tasks assessed/performed       Past Medical History:  Diagnosis Date  . ADD (attention deficit disorder)    ADHD  . Anxiety   . Asthma   . Chronic back pain   . Depression    postpartum depression in past  . Fatigue   . GERD (gastroesophageal reflux disease)   . Numbness and tingling   . Seasonal allergies   . Seasonal allergies   . Seizures (HCC)    during childhood    Past Surgical History:  Procedure Laterality Date  . CESAREAN SECTION  2014  . CESAREAN SECTION WITH BILATERAL TUBAL LIGATION Bilateral 09/08/2015   Procedure: CESAREAN SECTION WITH BILATERAL TUBAL LIGATION;  Surgeon: Suzy Bouchard, MD;  Location: [facility] ORS;  Service: Obstetrics;  Laterality: Bilateral;  . TONSILLECTOMY      There were no vitals filed for this visit.  Subjective Assessment - 06/22/19 1651    Subjective  Patient reports she did not notice much difference following TDN, but was sore after. Reports tension headache today at R temple that is only on the R side. Reports this and bilat neck tension are contributing to 6/10 pain.    Pertinent History  Patient is a 29 y.o. female who presents to outpatient physical  therapy with a referral for medical diagnosis bilateral occipital neuralgia, cervical myofascial pian syndrome. This patient's chief complaints consist of constant headache and neck pain with intermittent paresthesia in the face that started in December 2020, leading to the following functional deficits: slows her down, she pushes through a lot of it, finds it hard to sit without support, keeps her up at night, has had to cut back on work, bending, lifting, difficulty caring for 2 small children, interferes with crafting hobby ,working as a Lawyer, traveling, driving, turing the head, computer work. Relevant past medical history and comorbidities include ADHD, anxiety, asthma, chronic back pain, GERD, seizures (childhood), depression (well managed per pt), hx of c-section and B tubal ligation. Denies hx of stroke, diabetes, cardiac events, cancer. Denies spinal surgeries, jaw problems.    Limitations  Sitting;Reading;House hold activities;Lifting;Other (comment)    How long can you sit comfortably?  20 minutes    Diagnostic tests  cerivcal spine and head MRI normal except some straightening of cervical lordosis (performed after onset of symptoms).    Patient Stated Goals  to get back to being normal and not have any pain at all or can tolerate it without slowing her down or stopping her from doing her normal things.    Pain Onset  More than a month ago       Manual STM with trigger point release to R UT, cervical paraspinals, suboccipitals and (inc time) on R temporalis  Dry Needling: (2/2/2/1) 83mm .30 needles placed along the R UT, suboccipital, and temporalis, and L UT Biceps to decrease increased muscular spasms and trigger points with the patient positioned in supine. Patient was educated on risks and benefits of therapy and verbally consents to PT.  Manual traction 10sec on 10sec off x10    Ther-Ex Diaphragmatic breathing during traction with good carry over from last visit Suboccipital stretch  2x 30sec with education on anatomy and posture with good carry over Seated rows 35# 3x 10 with cuing for set up cervical posture with good carry over following Education on "jaw clenching" and possibility of utilizing a mouth guard to prevent this.                           PT Education - 06/22/19 1724    Education Details  TDN, tension headache/jaw pain, posture    Person(s) Educated  Patient    Methods  Explanation;Demonstration;Verbal cues;Tactile cues    Comprehension  Verbalized understanding;Returned demonstration;Verbal cues required;Tactile cues required       PT Short Term Goals - 06/02/19 1924      PT SHORT TERM GOAL #1   Title  Be independent with initial home exercise program for self-management of symptoms.    Baseline  initial HEP provided at IE (06/02/2019);    Time  2    Period  Weeks    Status  New    Target Date  06/16/19        PT Long Term Goals - 06/02/19 1925      PT LONG TERM GOAL #1   Title  Be independent with a long-term home exercise program for self-management of symptoms.    Baseline  Initial HEP provided at IE (06/02/2019);    Time  6    Period  Weeks    Status  New    Target Date  07/14/19      PT LONG TERM GOAL #2   Title  Reduce pain with functional activities to equal or less than 1/10 to allow patient to complete usual activities including ADLs, IADLs, and social engagement with less difficulty.    Baseline  up to 10/10 (06/02/2019);    Time  6    Period  Weeks    Status  New    Target Date  07/14/19      PT LONG TERM GOAL #3   Title  Have full cervical spine AROM with no compensations or increase in pain in all planes except intermittent end range discomfort to allow patient to complete valued activities with less difficulty.    Baseline  limited and painful - see objective exam (06/02/2019);    Time  6    Period  Weeks    Status  New    Target Date  07/14/19      PT LONG TERM GOAL #4   Title  Demonstrate improved  FOTO score equal or greater than 58 to demonstrate improvement in overall condition and self-reported functional ability.    Baseline  34 (06/02/2019);    Time  6    Period  Weeks    Status  New    Target Date  07/14/19      PT LONG TERM GOAL #5   Title  Complete community, work and/or recreational activities without limitation due to current condition.    Baseline  limited ability to complete her usual activities including sitting without support, sleeping, working as a Quarry manager, bending, lifting, caring for 2 small children, crafting, traveling, driving, turning head, computer work, etc (06/02/2019);    Time  6    Period  Weeks    Status  New    Target Date  07/14/19            Plan - 06/22/19 1729    Clinical Impression Statement  PT continued to utilize manual with TDN techniques, which patient reports resolution of tension headache following, with continued cervical pain 4/10 (decreased from 6/10). PT continued education on posture and implications in length tension relationships of the spine that cause pain, with ergonomic education given on posture with playing videogames (patient hobby) with good understanding. PT will continue progression as able.    Personal Factors and Comorbidities  Comorbidity 3+;Profession;Fitness;Past/Current Experience    Comorbidities  Relevant past medical history and comorbidities include ADHD, anxiety, asthma, chronic back pain, GERD, seizures (childhood), depression (well managed per pt), hx of c-section and B tubal ligation.    Examination-Activity Limitations  Dressing;Sit;Sleep;Caring for Others;Carry;Reach Overhead;Lift;Squat;Bend;Other    Examination-Participation Restrictions  Cleaning;Community Activity;Laundry;Interpersonal Relationship;Yard Work;Driving;Meal Prep    Stability/Clinical Decision Making  Evolving/Moderate complexity    Clinical Decision Making  Moderate    Rehab Potential  Good    PT Frequency  2x / week    PT Duration  6 weeks     PT Treatment/Interventions  ADLs/Self Care Home Management;Aquatic Therapy;Biofeedback;Cryotherapy;Moist Heat;Electrical Stimulation;Traction;Therapeutic activities;Therapeutic exercise;Neuromuscular re-education;Patient/family education;Manual techniques;Passive range of motion;Dry needling;Taping;Spinal Manipulations;Joint Manipulations    PT Next Visit Plan  dry needling    PT Home Exercise Plan  supine chin tuck x 20 twice a day    Consulted and Agree with Plan of Care  Patient       Patient will benefit from skilled therapeutic intervention in order to improve the following deficits and impairments:  Decreased endurance, Decreased mobility, Hypomobility, Increased muscle spasms, Impaired sensation, Impaired perceived functional ability, Decreased range of motion, Decreased activity tolerance, Decreased strength, Increased fascial restricitons, Impaired flexibility, Postural dysfunction, Pain  Visit Diagnosis: Nonintractable headache, unspecified chronicity pattern, unspecified headache type  Cervicalgia     Problem List Patient Active Problem List   Diagnosis Date Noted  . Myofascial pain syndrome, cervical 05/26/2019  . Cervico-occipital neuralgia 05/26/2019  . Post-operative state 09/08/2015  . Amniotic fluid leaking 08/12/2015  . Nausea and vomiting in pregnancy 07/05/2015  . Family history of Down syndrome 03/16/2015  . Hyperemesis affecting pregnancy, antepartum 03/10/2015  . Cough 03/10/2015  . Viral respiratory illness 03/10/2015   Shelton Silvas PT, DPT Shelton Silvas 06/22/2019, 5:31 PM  [place] [facility] [address], [place], [address] Phone: [phone]   Fax:  [phone]  Name: Isaly Fasching MRN: [id number] Date of Birth: Aug 01, 1990

## 2019-06-23 ENCOUNTER — Encounter: Payer: Commercial Managed Care - PPO | Admitting: Physical Therapy

## 2019-06-24 ENCOUNTER — Encounter: Payer: Self-pay | Admitting: Physical Therapy

## 2019-06-24 ENCOUNTER — Other Ambulatory Visit: Payer: Self-pay

## 2019-06-24 ENCOUNTER — Ambulatory Visit: Payer: BC Managed Care – PPO | Admitting: Physical Therapy

## 2019-06-24 DIAGNOSIS — M542 Cervicalgia: Secondary | ICD-10-CM

## 2019-06-24 DIAGNOSIS — R519 Headache, unspecified: Secondary | ICD-10-CM | POA: Diagnosis not present

## 2019-06-24 NOTE — Therapy (Signed)
[facility] [address] [place], [address] Phone: [phone]   Fax:  [phone]  Physical Therapy Treatment  Patient Details  Name: Erin [name] MRN: [id number] Date of Birth: April 04, 1991 Referring Provider (PT): Melvenia Beam, MD   Encounter Date: 06/24/2019  PT End of Session - 06/24/19 1707    Visit Number  4    Number of Visits  12    Date for PT Re-Evaluation  08/25/19    Authorization - Visit Number  4    Authorization - Number of Visits  10    PT Start Time  0443    PT Stop Time  0525    PT Time Calculation (min)  42 min    Activity Tolerance  Patient tolerated treatment well    Behavior During Therapy  [facility] for tasks assessed/performed       Past Medical History:  Diagnosis Date  . ADD (attention deficit disorder)    ADHD  . Anxiety   . Asthma   . Chronic back pain   . Depression    postpartum depression in past  . Fatigue   . GERD (gastroesophageal reflux disease)   . Numbness and tingling   . Seasonal allergies   . Seasonal allergies   . Seizures ([place])    during childhood    Past Surgical History:  Procedure Laterality Date  . CESAREAN SECTION  2014  . CESAREAN SECTION WITH BILATERAL TUBAL LIGATION Bilateral 09/08/2015   Procedure: CESAREAN SECTION WITH BILATERAL TUBAL LIGATION;  Surgeon: Boykin Nearing, MD;  Location: [facility] ORS;  Service: Obstetrics;  Laterality: Bilateral;  . TONSILLECTOMY      There were no vitals filed for this visit.  Subjective Assessment - 06/24/19 1641    Subjective  Patient reports she has been feeling good since last session, though this morning she started having some numbess in bilat fingers, but that this has been getting better throughout the day. Reports this feels like when your foot is asleep. Reports no headaches, and 3/10 pain at the base of the skull.    Pertinent History  Patient is a 29 y.o. female who presents to  outpatient physical therapy with a referral for medical diagnosis bilateral occipital neuralgia, cervical myofascial pian syndrome. This patient's chief complaints consist of constant headache and neck pain with intermittent paresthesia in the face that started in December 2020, leading to the following functional deficits: slows her down, she pushes through a lot of it, finds it hard to sit without support, keeps her up at night, has had to cut back on work, bending, lifting, difficulty caring for 2 small children, interferes with crafting hobby ,working as a Quarry manager, traveling, driving, turing the head, computer work. Relevant past medical history and comorbidities include ADHD, anxiety, asthma, chronic back pain, GERD, seizures (childhood), depression (well managed per pt), hx of c-section and B tubal ligation. Denies hx of stroke, diabetes, cardiac events, cancer. Denies spinal surgeries, jaw problems.    Limitations  Sitting;Reading;House hold activities;Lifting;Other (comment)    How long can you sit comfortably?  20 minutes    Diagnostic tests  cerivcal spine and head MRI normal except some straightening of cervical lordosis (performed after onset of symptoms).    Patient Stated Goals  to get back to being normal and not have any pain at all or can tolerate it without slowing her down or stopping her from doing her normal things.  Pain Onset  More than a month ago           Manual STM withtrigger point releaseto R UT, and bilat suboccipitals Manual cervical traction 10sec on 10sec off x10 Dry Needling: (2/2)83mm.30needles placed along the R UT and L UT to decrease increased muscular spasms and trigger points with the patient positioned in supine. Patient was educated on risks and benefits of therapy and verbally consents to PT.     Ther-Ex Standing rows 15# 10 20# 2x 10 with cuing for set up abdominal bracing and cervical posture with good carry over following Plantigrade push up  from mat table 3x 10 with demo and max cuing needed initially for scapular motion with good carry over Plantigrade alt scap taps with GTB 3x 12 with excellent carry over of scapular stability of non-moving shoulder Band pull aparts GTB 2x 10; BluTB x10 with cuing on increasing intensity and eccentric control with good carry over  Review of Suboccipital stretch x30sec                    PT Education - 06/24/19 1703    Education Details  TDN, therex form    Person(s) Educated  Patient    Methods  Explanation;Demonstration;Verbal cues;Tactile cues    Comprehension  Verbalized understanding;Returned demonstration;Verbal cues required;Tactile cues required       PT Short Term Goals - 06/02/19 1924      PT SHORT TERM GOAL #1   Title  Be independent with initial home exercise program for self-management of symptoms.    Baseline  initial HEP provided at IE (06/02/2019);    Time  2    Period  Weeks    Status  New    Target Date  06/16/19        PT Long Term Goals - 06/02/19 1925      PT LONG TERM GOAL #1   Title  Be independent with a long-term home exercise program for self-management of symptoms.    Baseline  Initial HEP provided at IE (06/02/2019);    Time  6    Period  Weeks    Status  New    Target Date  07/14/19      PT LONG TERM GOAL #2   Title  Reduce pain with functional activities to equal or less than 1/10 to allow patient to complete usual activities including ADLs, IADLs, and social engagement with less difficulty.    Baseline  up to 10/10 (06/02/2019);    Time  6    Period  Weeks    Status  New    Target Date  07/14/19      PT LONG TERM GOAL #3   Title  Have full cervical spine AROM with no compensations or increase in pain in all planes except intermittent end range discomfort to allow patient to complete valued activities with less difficulty.    Baseline  limited and painful - see objective exam (06/02/2019);    Time  6    Period  Weeks    Status   New    Target Date  07/14/19      PT LONG TERM GOAL #4   Title  Demonstrate improved FOTO score equal or greater than 58 to demonstrate improvement in overall condition and self-reported functional ability.    Baseline  34 (06/02/2019);    Time  6    Period  Weeks    Status  New    Target Date  07/14/19      PT LONG TERM GOAL #5   Title  Complete community, work and/or recreational activities without limitation due to current condition.    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 PT was able to progress therex in lieu of pain with good success. Patient is able to comply with all cuing for posture and therex technique to accomplish scapular stability and scapulo-humeral rhythm. PT will continue progression as able.    Personal Factors and Comorbidities  Comorbidity 3+;Profession;Fitness;Past/Current Experience    Comorbidities  Relevant past medical history and comorbidities include ADHD, anxiety, asthma, chronic back pain, GERD, seizures (childhood), depression (well managed per pt), hx of c-section and B tubal ligation.    Examination-Activity Limitations  Dressing;Sit;Sleep;Caring for Others;Carry;Reach Overhead;Lift;Squat;Bend;Other    Examination-Participation Restrictions  Cleaning;Community Activity;Laundry;Interpersonal Relationship;Yard Work;Driving;Meal Prep    Stability/Clinical Decision Making  Evolving/Moderate complexity    Clinical Decision Making  Moderate    Rehab Potential  Good    PT Frequency  2x /  week    PT Duration  6 weeks    PT Treatment/Interventions  ADLs/Self Care Home Management;Aquatic Therapy;Biofeedback;Cryotherapy;Moist Heat;Electrical Stimulation;Traction;Therapeutic activities;Therapeutic exercise;Neuromuscular re-education;Patient/family education;Manual techniques;Passive range of motion;Dry needling;Taping;Spinal Manipulations;Joint Manipulations    PT Next Visit Plan  dry needling    PT Home Exercise Plan  supine chin tuck x 20 twice a day    Consulted and Agree with Plan of Care  Patient       Patient will benefit from skilled therapeutic intervention in order to improve the following deficits and impairments:  Decreased endurance, Decreased mobility, Hypomobility, Increased muscle spasms, Impaired sensation, Impaired perceived functional ability, Decreased range of motion, Decreased activity tolerance, Decreased strength, Increased fascial restricitons, Impaired flexibility, Postural dysfunction, Pain  Visit Diagnosis: Nonintractable headache, unspecified chronicity pattern, unspecified headache type  Cervicalgia     Problem List Patient Active Problem List   Diagnosis Date Noted  . Myofascial pain syndrome, cervical 05/26/2019  . Cervico-occipital neuralgia 05/26/2019  . Post-operative state 09/08/2015  . Amniotic fluid leaking 08/12/2015  . Nausea and vomiting in pregnancy 07/05/2015  . Family history of Down syndrome 03/16/2015  . Hyperemesis affecting pregnancy, antepartum 03/10/2015  . Cough 03/10/2015  . Viral respiratory illness 03/10/2015   Staci Acosta PT, DPT Staci Acosta 06/24/2019, 5:35 PM  [facility] [address], [place], [address] Phone: [phone]   Fax:  [phone]  Name: Maritssa Haughton MRN: [id number] Date of Birth: [birth year]

## 2019-06-28 ENCOUNTER — Ambulatory Visit: Payer: Self-pay | Admitting: Physical Therapy

## 2019-07-01 ENCOUNTER — Other Ambulatory Visit: Payer: Self-pay

## 2019-07-01 ENCOUNTER — Encounter: Payer: Self-pay | Admitting: Physical Therapy

## 2019-07-01 ENCOUNTER — Ambulatory Visit: Payer: Self-pay | Attending: Neurology | Admitting: Physical Therapy

## 2019-07-01 DIAGNOSIS — M542 Cervicalgia: Secondary | ICD-10-CM | POA: Insufficient documentation

## 2019-07-01 DIAGNOSIS — R202 Paresthesia of skin: Secondary | ICD-10-CM | POA: Insufficient documentation

## 2019-07-01 DIAGNOSIS — R519 Headache, unspecified: Secondary | ICD-10-CM | POA: Insufficient documentation

## 2019-07-01 DIAGNOSIS — M62838 Other muscle spasm: Secondary | ICD-10-CM | POA: Insufficient documentation

## 2019-07-01 NOTE — Therapy (Signed)
[facility] [address] [place], [address] Phone: [phone]   Fax:  [phone]  Physical Therapy Treatment  Patient Details  Name: Erin [name] MRN: [id number] Date of Birth: 1990-05-19 Referring Provider (PT): Anson Fret, MD   Encounter Date: 07/01/2019  PT End of Session - 07/01/19 1605    Visit Number  5    Number of Visits  12    Date for PT Re-Evaluation  08/25/19    Authorization - Visit Number  5    Authorization - Number of Visits  10    PT Start Time  0333    PT Stop Time  0412    PT Time Calculation (min)  39 min    Activity Tolerance  Patient tolerated treatment well;Patient limited by pain    Behavior During Therapy  [facility] for tasks assessed/performed       Past Medical History:  Diagnosis Date  . ADD (attention deficit disorder)    ADHD  . Anxiety   . Asthma   . Chronic back pain   . Depression    postpartum depression in past  . Fatigue   . GERD (gastroesophageal reflux disease)   . Numbness and tingling   . Seasonal allergies   . Seasonal allergies   . Seizures ([facility])    during childhood    Past Surgical History:  Procedure Laterality Date  . CESAREAN SECTION  2014  . CESAREAN SECTION WITH BILATERAL TUBAL LIGATION Bilateral 09/08/2015   Procedure: CESAREAN SECTION WITH BILATERAL TUBAL LIGATION;  Surgeon: Suzy Bouchard, MD;  Location: [facility] ORS;  Service: Obstetrics;  Laterality: Bilateral;  . TONSILLECTOMY      There were no vitals filed for this visit.  Subjective Assessment - 07/01/19 1535    Subjective  Patient reports she is starting to think her tension headaches and neck tension is more stress related, that she had no pain until last night when there was a stressful event that brought pain back on. Reports 7/10 pain in bilat neck with bilat tension headache.    Pertinent History  Patient is a 29 y.o. female who presents to outpatient physical  therapy with a referral for medical diagnosis bilateral occipital neuralgia, cervical myofascial pian syndrome. This patient's chief complaints consist of constant headache and neck pain with intermittent paresthesia in the face that started in December 2020, leading to the following functional deficits: slows her down, she pushes through a lot of it, finds it hard to sit without support, keeps her up at night, has had to cut back on work, bending, lifting, difficulty caring for 2 small children, interferes with crafting hobby ,working as a Lawyer, traveling, driving, turing the head, computer work. Relevant past medical history and comorbidities include ADHD, anxiety, asthma, chronic back pain, GERD, seizures (childhood), depression (well managed per pt), hx of c-section and B tubal ligation. Denies hx of stroke, diabetes, cardiac events, cancer. Denies spinal surgeries, jaw problems.    Limitations  Sitting;Reading;House hold activities;Lifting;Other (comment)    How long can you sit comfortably?  20 minutes    Diagnostic tests  cerivcal spine and head MRI normal except some straightening of cervical lordosis (performed after onset of symptoms).    Patient Stated Goals  to get back to being normal and not have any pain at all or can tolerate it without slowing her down or stopping her from doing her normal things.    Pain Onset  More than a month ago       Manual STM withtrigger point releasetobilat UT, bilat levator, bilat temoralis, and bilat suboccipitals Manual cervical traction 10sec on 10sec off x10 Dry Needling: (2/2)74mm.30needles placed along the R temporalis and L temporalis to decrease increased muscular spasms and trigger points with the patient positioned in sidelying, able to identify pulse bilat. Patient was educated on risks and benefits of therapy and verbally consents to PT.  Education during on sleep hygiene and possibly using a mouth gaurd to prevent temporalis tension when  stressed. Education on holistic approach to health/wellness with mental health services involved too in order to ensure proper stress response to anxiety. Good acceptance and verbalized understanding   Ther-Ex Diaphragmatic breathing over with cuing initially for hands on chest and belly with good carry over of this cue UT stretch in sitting hold each side Suboccipital stretch in sitting hold each side Levator stretch in sitting hold each side Education on HEP of the following prior to bed to aid in relaxation                      PT Education - 07/01/19 1604    Education Details  TDN, holistic approach to stress and tension, diaphragmatic breathing    Person(s) Educated  Patient    Methods  Explanation;Demonstration;Verbal cues    Comprehension  Verbalized understanding;Returned demonstration;Verbal cues required       PT Short Term Goals - 06/02/19 1924      PT SHORT TERM GOAL #1   Title  Be independent with initial home exercise program for self-management of symptoms.    Baseline  initial HEP provided at IE (06/02/2019);    Time  2    Period  Weeks    Status  New    Target Date  06/16/19        PT Long Term Goals - 06/02/19 1925      PT LONG TERM GOAL #1   Title  Be independent with a long-term home exercise program for self-management of symptoms.    Baseline  Initial HEP provided at IE (06/02/2019);    Time  6    Period  Weeks    Status  New    Target Date  07/14/19      PT LONG TERM GOAL #2   Title  Reduce pain with functional activities to equal or less than 1/10 to allow patient to complete usual activities including ADLs, IADLs, and social engagement with less difficulty.    Baseline  up to 10/10 (06/02/2019);    Time  6    Period  Weeks    Status  New    Target Date  07/14/19      PT LONG TERM GOAL #3   Title  Have full cervical spine AROM with no compensations or increase in pain in all planes except intermittent end  range discomfort to allow patient to complete valued activities with less difficulty.    Baseline  limited and painful - see objective exam (06/02/2019);    Time  6    Period  Weeks    Status  New    Target Date  07/14/19      PT LONG TERM GOAL #4   Title  Demonstrate improved FOTO score equal or greater than 58 to demonstrate improvement in overall condition and self-reported functional ability.    Baseline  34 (06/02/2019);    Time  6  Period  Weeks    Status  New    Target Date  07/14/19      PT LONG TERM GOAL #5   Title  Complete community, work and/or recreational activities without limitation due to current condition.    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 Pt reports no tension headache following session and only 3/10 pain overall. PT educated patient on sleep ygiene as patient expresses she thinks this is a big part of her strettch, and encouraged patient to seek a holistic approach to stress management and response, involving mental health professionals in her care. Patient is very accepting of all suggestions, and education and verbalizes understanding. Patient does express having some issues with her insurance that may be inhibiting her from seeking mental health support, PT will provided resources as needed for this. PT is able to educate patient on stretching to aid in sleep hygiene and to maintain decreased tnesion accomplished during PT session. PT will continue progression as able.    Personal Factors and Comorbidities   Comorbidity 3+;Profession;Fitness;Past/Current Experience    Comorbidities  Relevant past medical history and comorbidities include ADHD, anxiety, asthma, chronic back pain, GERD, seizures (childhood), depression (well managed per pt), hx of c-section and B tubal ligation.    Examination-Activity Limitations  Dressing;Sit;Sleep;Caring for Others;Carry;Reach Overhead;Lift;Squat;Bend;Other    Examination-Participation Restrictions  Cleaning;Community Activity;Laundry;Interpersonal Relationship;Yard Work;Driving;Meal Prep    Stability/Clinical Decision Making  Evolving/Moderate complexity    Clinical Decision Making  Moderate    Rehab Potential  Good    PT Frequency  2x / week    PT Duration  6 weeks    PT Treatment/Interventions  ADLs/Self Care Home Management;Aquatic Therapy;Biofeedback;Cryotherapy;Moist Heat;Electrical Stimulation;Traction;Therapeutic activities;Therapeutic exercise;Neuromuscular re-education;Patient/family education;Manual techniques;Passive range of motion;Dry needling;Taping;Spinal Manipulations;Joint Manipulations    PT Next Visit Plan  dry needling    PT Home Exercise Plan  supine chin tuck x 20 twice a day    Consulted and Agree with Plan of Care  Patient       Patient will benefit from skilled therapeutic intervention in order to improve the following deficits and impairments:  Decreased endurance, Decreased mobility, Hypomobility, Increased muscle spasms, Impaired sensation, Impaired perceived functional ability, Decreased range of motion, Decreased activity tolerance, Decreased strength, Increased fascial restricitons, Impaired flexibility, Postural dysfunction, Pain  Visit Diagnosis: Nonintractable headache, unspecified chronicity pattern, unspecified headache type  Cervicalgia  Paresthesia of skin  Other muscle spasm     Problem List Patient Active Problem List   Diagnosis Date Noted  . Myofascial pain syndrome, cervical 05/26/2019  . Cervico-occipital  neuralgia 05/26/2019  . Post-operative state 09/08/2015  . Amniotic fluid leaking 08/12/2015  . Nausea and vomiting in pregnancy 07/05/2015  . Family history of Down syndrome 03/16/2015  . Hyperemesis affecting pregnancy, antepartum 03/10/2015  . Cough 03/10/2015  . Viral respiratory illness 03/10/2015   Shelton Silvas PT, DPT Shelton Silvas 07/01/2019, 4:21 PM  [facility]  [address], [place], [address] Phone: [phone]   Fax:  [phone]  Name: Luvena Wentling MRN: [id number] Date of Birth: 1990-07-18

## 2019-07-06 ENCOUNTER — Ambulatory Visit: Payer: Self-pay | Admitting: Physical Therapy

## 2019-07-08 ENCOUNTER — Ambulatory Visit: Payer: Self-pay | Admitting: Physical Therapy

## 2019-07-08 ENCOUNTER — Other Ambulatory Visit: Payer: Self-pay

## 2019-07-08 ENCOUNTER — Encounter: Payer: Self-pay | Admitting: Physical Therapy

## 2019-07-08 DIAGNOSIS — R519 Headache, unspecified: Secondary | ICD-10-CM

## 2019-07-08 DIAGNOSIS — M62838 Other muscle spasm: Secondary | ICD-10-CM

## 2019-07-08 DIAGNOSIS — R202 Paresthesia of skin: Secondary | ICD-10-CM

## 2019-07-08 DIAGNOSIS — M542 Cervicalgia: Secondary | ICD-10-CM

## 2019-07-08 NOTE — Therapy (Signed)
[facility] [address], [place], [address] Phone: [phone]   Fax:  [phone]  Physical Therapy Treatment  Patient Details  Name: Erin [name] MRN: [id number] Date of Birth: 09-Dec-1990 Referring Provider (PT): Melvenia Beam, MD   Encounter Date: 07/08/2019  PT End of Session - 07/08/19 1619    Visit Number  6    Number of Visits  12    Date for PT Re-Evaluation  08/25/19    Authorization - Visit Number  6    Authorization - Number of Visits  10    PT Start Time  0347    PT Stop Time  0427    PT Time Calculation (min)  40 min    Activity Tolerance  Patient tolerated treatment well;Patient limited by pain    Behavior During Therapy  [facility] for tasks assessed/performed       Past Medical History:  Diagnosis Date  . ADD (attention deficit disorder)    ADHD  . Anxiety   . Asthma   . Chronic back pain   . Depression    postpartum depression in past  . Fatigue   . GERD (gastroesophageal reflux disease)   . Numbness and tingling   . Seasonal allergies   . Seasonal allergies   . Seizures ([place])    during childhood    Past Surgical History:  Procedure Laterality Date  . CESAREAN SECTION  2014  . CESAREAN SECTION WITH BILATERAL TUBAL LIGATION Bilateral 09/08/2015   Procedure: CESAREAN SECTION WITH BILATERAL TUBAL LIGATION;  Surgeon: Boykin Nearing, MD;  Location: [facility] ORS;  Service: Obstetrics;  Laterality: Bilateral;  . TONSILLECTOMY      There were no vitals filed for this visit.  Subjective Assessment - 07/08/19 1551    Subjective  Patinet reports she feels a little better than when she called PT this am. (see called PT asking if she could come in d/t some increased tension and what sounds like aura symptoms). She reported earlier she was having some vision changes, and light sensitivity. THis afternoon she feels a little better but still has 6/10 bilat head and neck pain,  and tension headache. She reached out to neuro MD, but has not heard back yet. She would like to inquire about medication management.    Pertinent History  Patient is a 29 y.o. female who presents to outpatient physical therapy with a referral for medical diagnosis bilateral occipital neuralgia, cervical myofascial pian syndrome. This patient's chief complaints consist of constant headache and neck pain with intermittent paresthesia in the face that started in December 2020, leading to the following functional deficits: slows her down, she pushes through a lot of it, finds it hard to sit without support, keeps her up at night, has had to cut back on work, bending, lifting, difficulty caring for 2 small children, interferes with crafting hobby ,working as a Quarry manager, traveling, driving, turing the head, computer work. Relevant past medical history and comorbidities include ADHD, anxiety, asthma, chronic back pain, GERD, seizures (childhood), depression (well managed per pt), hx of c-section and B tubal ligation. Denies hx of stroke, diabetes, cardiac events, cancer. Denies spinal surgeries, jaw problems.    Limitations  Sitting;Reading;House hold activities;Lifting;Other (comment)    How long can you sit comfortably?  20 minutes    Diagnostic tests  cerivcal spine and head MRI normal except some straightening of cervical lordosis (performed after onset of symptoms).    Patient  Stated Goals  to get back to being normal and not have any pain at all or can tolerate it without slowing her down or stopping her from doing her normal things.    Pain Onset  More than a month ago         Manual STM withtrigger point releasetobilat UT, bilat levator, bilat temoralis,and bilat suboccipitals Manual cervical traction 10sec on 10sec off x10 Dry Needling: (2/2/2/2)72mm.30needles placed along the R temporalis/L temporalis and bilat UTs to decrease increased muscular spasms and trigger points with the patient  positioned in sidelying, able to identify pulse bilat, and prone with pincer grasp for UT. Patient was educated on risks and benefits of therapy and verbally consents to PT.    ESTIM + heat pack HiVolt ESTIM 10 min at patient tolerated 120V increased to 130V through treatment at bilat cervical paraspinals and UT . Attempted to decrease tension and pain at this area. With PT assessing patient tolerance throughout (increasing intensity as needed), monitoring skin integrity (normal), with decreased pain noted from patient                         PT Education - 07/08/19 1619    Education Details  TDN, ESTIM    Person(s) Educated  Patient    Methods  Explanation;Demonstration;Verbal cues    Comprehension  Verbalized understanding;Returned demonstration;Verbal cues required       PT Short Term Goals - 06/02/19 1924      PT SHORT TERM GOAL #1   Title  Be independent with initial home exercise program for self-management of symptoms.    Baseline  initial HEP provided at IE (06/02/2019);    Time  2    Period  Weeks    Status  New    Target Date  06/16/19        PT Long Term Goals - 06/02/19 1925      PT LONG TERM GOAL #1   Title  Be independent with a long-term home exercise program for self-management of symptoms.    Baseline  Initial HEP provided at IE (06/02/2019);    Time  6    Period  Weeks    Status  New    Target Date  07/14/19      PT LONG TERM GOAL #2   Title  Reduce pain with functional activities to equal or less than 1/10 to allow patient to complete usual activities including ADLs, IADLs, and social engagement with less difficulty.    Baseline  up to 10/10 (06/02/2019);    Time  6    Period  Weeks    Status  New    Target Date  07/14/19      PT LONG TERM GOAL #3   Title  Have full cervical spine AROM with no compensations or increase in pain in all planes except intermittent end range discomfort to allow patient to complete valued activities with less  difficulty.    Baseline  limited and painful - see objective exam (06/02/2019);    Time  6    Period  Weeks    Status  New    Target Date  07/14/19      PT LONG TERM GOAL #4   Title  Demonstrate improved FOTO score equal or greater than 58 to demonstrate improvement in overall condition and self-reported functional ability.    Baseline  34 (06/02/2019);    Time  6    Period  Weeks    Status  New    Target Date  07/14/19      PT LONG TERM GOAL #5   Title  Complete community, work and/or recreational activities without limitation due to current condition.    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 Patient responds well to interventions and reports that her tension headache has subsided following. Reports 4/10 overall at the end of session (6/10 to begin). PT encouraged pt to reach out to MD for migraine medication management with verbalized understanding. PT will continue progression as able.    Personal Factors and Comorbidities  Comorbidity 3+;Profession;Fitness;Past/Current Experience    Comorbidities  Relevant past medical history and comorbidities include ADHD, anxiety, asthma, chronic back pain, GERD, seizures (childhood), depression (well managed per pt), hx of c-section and B tubal ligation.    Examination-Activity Limitations  Dressing;Sit;Sleep;Caring for Others;Carry;Reach Overhead;Lift;Squat;Bend;Other    Examination-Participation Restrictions  Cleaning;Community  Activity;Laundry;Interpersonal Relationship;Yard Work;Driving;Meal Prep    Stability/Clinical Decision Making  Evolving/Moderate complexity    Clinical Decision Making  Moderate    Rehab Potential  Good    PT Frequency  2x / week    PT Duration  6 weeks    PT Treatment/Interventions  ADLs/Self Care Home Management;Aquatic Therapy;Biofeedback;Cryotherapy;Moist Heat;Electrical Stimulation;Traction;Therapeutic activities;Therapeutic exercise;Neuromuscular re-education;Patient/family education;Manual techniques;Passive range of motion;Dry needling;Taping;Spinal Manipulations;Joint Manipulations    PT Next Visit Plan  dry needling    PT Home Exercise Plan  supine chin tuck x 20 twice a day    Consulted and Agree with Plan of Care  Patient       Patient will benefit from skilled therapeutic intervention in order to improve the following deficits and impairments:  Decreased endurance, Decreased mobility, Hypomobility, Increased muscle spasms, Impaired sensation, Impaired perceived functional ability, Decreased range of motion, Decreased activity tolerance, Decreased strength, Increased fascial restricitons, Impaired flexibility, Postural dysfunction, Pain  Visit Diagnosis: Nonintractable headache, unspecified chronicity pattern, unspecified headache type  Cervicalgia  Paresthesia of skin  Other muscle spasm     Problem List Patient Active Problem List   Diagnosis Date Noted  . Myofascial pain syndrome, cervical 05/26/2019  . Cervico-occipital neuralgia 05/26/2019  . Post-operative state 09/08/2015  . Amniotic fluid leaking 08/12/2015  . Nausea and vomiting in pregnancy 07/05/2015  . Family history of Down syndrome 03/16/2015  . Hyperemesis affecting pregnancy, antepartum 03/10/2015  . Cough 03/10/2015  . Viral respiratory illness 03/10/2015   Staci Acosta PT, DPT Staci Acosta 07/08/2019, 4:27 PM  [facility] [address], [place], [address] Phone: [phone]   Fax:  [phone]  Name: Erin [name] MRN: [id number] Date of Birth: 1990-05-26

## 2019-07-13 ENCOUNTER — Encounter: Payer: Self-pay | Admitting: Physical Therapy

## 2019-07-13 ENCOUNTER — Ambulatory Visit: Payer: Self-pay | Admitting: Physical Therapy

## 2019-07-13 ENCOUNTER — Telehealth: Payer: Self-pay | Admitting: Family Medicine

## 2019-07-13 ENCOUNTER — Other Ambulatory Visit: Payer: Self-pay

## 2019-07-13 DIAGNOSIS — M542 Cervicalgia: Secondary | ICD-10-CM

## 2019-07-13 DIAGNOSIS — R519 Headache, unspecified: Secondary | ICD-10-CM

## 2019-07-13 NOTE — Telephone Encounter (Signed)
She may increase topiramate to 100mg  twice daily. I would suggest continuing 50mg  in morning and increase to 100mg  at night for 1-2 weeks then increase to 100mg  twice daily is well tolerated. Please remind her to focus on any triggers. If she has allergies, isn't drinking water or stressed, headaches can worsen.

## 2019-07-13 NOTE — Therapy (Signed)
[facility] [address] [place], [address] Phone: [phone]   Fax:  [phone]  Physical Therapy Treatment  Patient Details  Name: Erin [name] MRN: [id number] Date of Birth: 1991-02-22 Referring Provider (PT): Melvenia Beam, MD   Encounter Date: 07/13/2019  PT End of Session - 07/13/19 1453    Visit Number  7    Number of Visits  12    Date for PT Re-Evaluation  08/25/19    Authorization Type  [employer] reporting period from 06/02/2019    Authorization - Visit Number  7    Authorization - Number of Visits  10    PT Start Time  0234    PT Stop Time  0313    PT Time Calculation (min)  39 min    Activity Tolerance  Patient tolerated treatment well    Behavior During Therapy  [facility] for tasks assessed/performed       Past Medical History:  Diagnosis Date  . ADD (attention deficit disorder)    ADHD  . Anxiety   . Asthma   . Chronic back pain   . Depression    postpartum depression in past  . Fatigue   . GERD (gastroesophageal reflux disease)   . Numbness and tingling   . Seasonal allergies   . Seasonal allergies   . Seizures ([place])    during childhood    Past Surgical History:  Procedure Laterality Date  . CESAREAN SECTION  2014  . CESAREAN SECTION WITH BILATERAL TUBAL LIGATION Bilateral 09/08/2015   Procedure: CESAREAN SECTION WITH BILATERAL TUBAL LIGATION;  Surgeon: Boykin Nearing, MD;  Location: [facility] ORS;  Service: Obstetrics;  Laterality: Bilateral;  . TONSILLECTOMY      There were no vitals filed for this visit.  Subjective Assessment - 07/13/19 1435    Subjective  Patinet reports she has a L sided tension headache but is better overall compared to last week. Tension headache just came on today with start of her cycle. Reports this pain is 6/10 but that cervical pain is 5/10    Pertinent History  Patient is a 29 y.o. female who presents to outpatient physical therapy  with a referral for medical diagnosis bilateral occipital neuralgia, cervical myofascial pian syndrome. This patient's chief complaints consist of constant headache and neck pain with intermittent paresthesia in the face that started in December 2020, leading to the following functional deficits: slows her down, she pushes through a lot of it, finds it hard to sit without support, keeps her up at night, has had to cut back on work, bending, lifting, difficulty caring for 2 small children, interferes with crafting hobby ,working as a Quarry manager, traveling, driving, turing the head, computer work. Relevant past medical history and comorbidities include ADHD, anxiety, asthma, chronic back pain, GERD, seizures (childhood), depression (well managed per pt), hx of c-section and B tubal ligation. Denies hx of stroke, diabetes, cardiac events, cancer. Denies spinal surgeries, jaw problems.    Limitations  Sitting;Reading;House hold activities;Lifting;Other (comment)    How long can you sit comfortably?  20 minutes    Diagnostic tests  cerivcal spine and head MRI normal except some straightening of cervical lordosis (performed after onset of symptoms).    Patient Stated Goals  to get back to being normal and not have any pain at all or can tolerate it without slowing her down or stopping her from doing her normal things.    Pain Onset  More than a month ago          Manual STM withtrigger point releasetoL UT,L temporalis and bilat suboccipitals Manual cervical traction 10sec on 10sec off x10 Dry Needling: (2/2)6mm.30needles placed along the L temporalis and L UT to decrease increased muscular spasms and trigger points with the patient positioned in sidelying (identifying pulse for temporalis) and supine (with pincer grasp for UT). Patient was educated on risks and benefits of therapy and verbally consents to PT.    Ther-Ex Qped alt shoulder ext x10 each with cuing needed for set up cervical and scapular  posture with good carry over Bird dog 2x 5/6 (each) with cuing needed for set up neutral spine with some correcting throughout Standing rows  20# 2x 10 with cuing for set up abdominal bracing and cervical posture with good carry over following Band pull aparts BluTB 3x10 with cuing on posture initially with good carry over HEP review                     PT Education - 07/13/19 1450    Education Details  TDN, therex form    Person(s) Educated  Patient    Methods  Explanation;Demonstration;Tactile cues;Verbal cues    Comprehension  Verbalized understanding;Returned demonstration;Tactile cues required;Verbal cues required       PT Short Term Goals - 06/02/19 1924      PT SHORT TERM GOAL #1   Title  Be independent with initial home exercise program for self-management of symptoms.    Baseline  initial HEP provided at IE (06/02/2019);    Time  2    Period  Weeks    Status  New    Target Date  06/16/19        PT Long Term Goals - 06/02/19 1925      PT LONG TERM GOAL #1   Title  Be independent with a long-term home exercise program for self-management of symptoms.    Baseline  Initial HEP provided at IE (06/02/2019);    Time  6    Period  Weeks    Status  New    Target Date  07/14/19      PT LONG TERM GOAL #2   Title  Reduce pain with functional activities to equal or less than 1/10 to allow patient to complete usual activities including ADLs, IADLs, and social engagement with less difficulty.    Baseline  up to 10/10 (06/02/2019);    Time  6    Period  Weeks    Status  New    Target Date  07/14/19      PT LONG TERM GOAL #3   Title  Have full cervical spine AROM with no compensations or increase in pain in all planes except intermittent end range discomfort to allow patient to complete valued activities with less difficulty.    Baseline  limited and painful - see objective exam (06/02/2019);    Time  6    Period  Weeks    Status  New    Target Date  07/14/19       PT LONG TERM GOAL #4   Title  Demonstrate improved FOTO score equal or greater than 58 to demonstrate improvement in overall condition and self-reported functional ability.    Baseline  34 (06/02/2019);    Time  6    Period  Weeks    Status  New    Target Date  07/14/19  PT LONG TERM GOAL #5   Title  Complete community, work and/or recreational activities without limitation due to current condition.    Baseline  limited ability to complete her usual activities including sitting without support, sleeping, working as a Lawyer, bending, lifting, caring for 2 small children, crafting, traveling, driving, turning head, computer work, etc (06/02/2019);    Time  6    Period  Weeks    Status  New    Target Date  07/14/19            Plan - 07/13/19 1504    Clinical Impression Statement  Patient with strong response to TDN with manual techniques with no tension headache or pain following. PT is able to progress therex for periscapular activation and posture with good success. PT will continue progression as able.    Personal Factors and Comorbidities  Comorbidity 3+;Profession;Fitness;Past/Current Experience    Comorbidities  Relevant past medical history and comorbidities include ADHD, anxiety, asthma, chronic back pain, GERD, seizures (childhood), depression (well managed per pt), hx of c-section and B tubal ligation.    Examination-Activity Limitations  Dressing;Sit;Sleep;Caring for Others;Carry;Reach Overhead;Lift;Squat;Bend;Other    Examination-Participation Restrictions  Cleaning;Community Activity;Laundry;Interpersonal Relationship;Yard Work;Driving;Meal Prep    Stability/Clinical Decision Making  Evolving/Moderate complexity    Clinical Decision Making  Moderate    Rehab Potential  Good    PT Frequency  2x / week    PT Duration  6 weeks    PT Treatment/Interventions  ADLs/Self Care Home Management;Aquatic Therapy;Biofeedback;Cryotherapy;Moist Heat;Electrical  Stimulation;Traction;Therapeutic activities;Therapeutic exercise;Neuromuscular re-education;Patient/family education;Manual techniques;Passive range of motion;Dry needling;Taping;Spinal Manipulations;Joint Manipulations    PT Next Visit Plan  dry needling    PT Home Exercise Plan  supine chin tuck x 20 twice a day    Consulted and Agree with Plan of Care  Patient       Patient will benefit from skilled therapeutic intervention in order to improve the following deficits and impairments:  Decreased endurance, Decreased mobility, Hypomobility, Increased muscle spasms, Impaired sensation, Impaired perceived functional ability, Decreased range of motion, Decreased activity tolerance, Decreased strength, Increased fascial restricitons, Impaired flexibility, Postural dysfunction, Pain  Visit Diagnosis: Nonintractable headache, unspecified chronicity pattern, unspecified headache type  Cervicalgia     Problem List Patient Active Problem List   Diagnosis Date Noted  . Myofascial pain syndrome, cervical 05/26/2019  . Cervico-occipital neuralgia 05/26/2019  . Post-operative state 09/08/2015  . Amniotic fluid leaking 08/12/2015  . Nausea and vomiting in pregnancy 07/05/2015  . Family history of Down syndrome 03/16/2015  . Hyperemesis affecting pregnancy, antepartum 03/10/2015  . Cough 03/10/2015  . Viral respiratory illness 03/10/2015   Staci Acosta PT, DPT Staci Acosta 07/13/2019, 3:14 PM  [facility] [address] [place], [address] Phone: [phone]   Fax:  [phone]  Name: Dallie Patton MRN: [id number] Date of Birth: April 04, 1991

## 2019-07-13 NOTE — Telephone Encounter (Signed)
Pt called wanting to know if she can take 2 tablets of the topamax states it has not been as helpful now that she has been on the medication for the last two months.

## 2019-07-13 NOTE — Telephone Encounter (Signed)
I called pt and she is taking topamax 50mg  po bid,  Is asking to increase to 100mg  po bid, since migraines have come back. Please advise.

## 2019-07-13 NOTE — Telephone Encounter (Signed)
Spoke with patient and reviewed NP's advice, recommendations with her. Patient verbalized understanding, appreciation.

## 2019-07-14 ENCOUNTER — Ambulatory Visit: Payer: Self-pay | Admitting: Physical Therapy

## 2019-07-21 ENCOUNTER — Ambulatory Visit: Payer: Self-pay | Admitting: Physical Therapy

## 2019-07-22 ENCOUNTER — Encounter: Payer: Self-pay | Admitting: Physical Therapy

## 2019-07-22 ENCOUNTER — Ambulatory Visit: Payer: Self-pay | Admitting: Physical Therapy

## 2019-07-22 ENCOUNTER — Other Ambulatory Visit: Payer: Self-pay

## 2019-07-22 DIAGNOSIS — R202 Paresthesia of skin: Secondary | ICD-10-CM

## 2019-07-22 DIAGNOSIS — M542 Cervicalgia: Secondary | ICD-10-CM

## 2019-07-22 DIAGNOSIS — M62838 Other muscle spasm: Secondary | ICD-10-CM

## 2019-07-22 DIAGNOSIS — R519 Headache, unspecified: Secondary | ICD-10-CM

## 2019-07-22 NOTE — Therapy (Signed)
[facility] [address], [place], [address] Phone: [phone]   Fax:  [phone]  Physical Therapy Treatment  Patient Details  Name: Erin [name] MRN: [id number] Date of Birth: 11-14-1990 Referring Provider (PT): Melvenia Beam, MD   Encounter Date: 07/22/2019  PT End of Session - 07/22/19 1538    Visit Number  8    Number of Visits  12    Date for PT Re-Evaluation  08/25/19    Authorization - Visit Number  8    Authorization - Number of Visits  10    PT Start Time  0320    PT Stop Time  0358    PT Time Calculation (min)  38 min    Activity Tolerance  Patient tolerated treatment well    Behavior During Therapy  [facility] for tasks assessed/performed       Past Medical History:  Diagnosis Date  . ADD (attention deficit disorder)    ADHD  . Anxiety   . Asthma   . Chronic back pain   . Depression    postpartum depression in past  . Fatigue   . GERD (gastroesophageal reflux disease)   . Numbness and tingling   . Seasonal allergies   . Seasonal allergies   . Seizures ([place])    during childhood    Past Surgical History:  Procedure Laterality Date  . CESAREAN SECTION  2014  . CESAREAN SECTION WITH BILATERAL TUBAL LIGATION Bilateral 09/08/2015   Procedure: CESAREAN SECTION WITH BILATERAL TUBAL LIGATION;  Surgeon: Boykin Nearing, MD;  Location: [facility] ORS;  Service: Obstetrics;  Laterality: Bilateral;  . TONSILLECTOMY      There were no vitals filed for this visit.  Subjective Assessment - 07/22/19 1523    Subjective  Patient reports her MD up'd her migraine medication dose and this has been helping. She does report that she does notice increased tension headaches with stress. She is having some tension in bilat temporalis that came on after her car battery died yesterday. Minimal neck pain today. Reports pain is 6/10 at these areas.    Pertinent History  Patient is a 29 y.o. female  who presents to outpatient physical therapy with a referral for medical diagnosis bilateral occipital neuralgia, cervical myofascial pian syndrome. This patient's chief complaints consist of constant headache and neck pain with intermittent paresthesia in the face that started in December 2020, leading to the following functional deficits: slows her down, she pushes through a lot of it, finds it hard to sit without support, keeps her up at night, has had to cut back on work, bending, lifting, difficulty caring for 2 small children, interferes with crafting hobby ,working as a Quarry manager, traveling, driving, turing the head, computer work. Relevant past medical history and comorbidities include ADHD, anxiety, asthma, chronic back pain, GERD, seizures (childhood), depression (well managed per pt), hx of c-section and B tubal ligation. Denies hx of stroke, diabetes, cardiac events, cancer. Denies spinal surgeries, jaw problems.    Limitations  Sitting;Reading;House hold activities;Lifting;Other (comment)    How long can you sit comfortably?  20 minutes    Diagnostic tests  cerivcal spine and head MRI normal except some straightening of cervical lordosis (performed after onset of symptoms).    Patient Stated Goals  to get back to being normal and not have any pain at all or can tolerate it without slowing her down or stopping her from doing her normal things.  Pain Onset  More than a month ago          Manual STM withtrigger point releasetoL UT,L temporalisand bilat suboccipitals Manual cervical traction 10sec on 10sec off x10 Dry Needling: (2/2)72mm.30needles placed along the L temporalis and L UT to decrease increased muscular spasms and trigger points with the patient positioned in sidelying (identifying pulse for temporalis) and supine (with pincer grasp for UT). Patient was educated on risks and benefits of therapy and verbally consents to PT.    Ther-Ex Bird dog 3x 6(each) with cuing needed  for set up neutral spine with better carry over throughout this session Push up plus from mat table 3x 10 with cuing for maintained neutral cervical posture, though patient demonstrates good set up Bent over rows 10# DB bilat 3x 10with demo and cuing for set up with decent carry over following                       PT Education - 07/22/19 1538    Education Details  TDN, therex form    Person(s) Educated  Patient    Methods  Explanation;Demonstration;Tactile cues;Verbal cues    Comprehension  Verbalized understanding;Returned demonstration;Verbal cues required;Tactile cues required       PT Short Term Goals - 06/02/19 1924      PT SHORT TERM GOAL #1   Title  Be independent with initial home exercise program for self-management of symptoms.    Baseline  initial HEP provided at IE (06/02/2019);    Time  2    Period  Weeks    Status  New    Target Date  06/16/19        PT Long Term Goals - 06/02/19 1925      PT LONG TERM GOAL #1   Title  Be independent with a long-term home exercise program for self-management of symptoms.    Baseline  Initial HEP provided at IE (06/02/2019);    Time  6    Period  Weeks    Status  New    Target Date  07/14/19      PT LONG TERM GOAL #2   Title  Reduce pain with functional activities to equal or less than 1/10 to allow patient to complete usual activities including ADLs, IADLs, and social engagement with less difficulty.    Baseline  up to 10/10 (06/02/2019);    Time  6    Period  Weeks    Status  New    Target Date  07/14/19      PT LONG TERM GOAL #3   Title  Have full cervical spine AROM with no compensations or increase in pain in all planes except intermittent end range discomfort to allow patient to complete valued activities with less difficulty.    Baseline  limited and painful - see objective exam (06/02/2019);    Time  6    Period  Weeks    Status  New    Target Date  07/14/19      PT LONG TERM GOAL #4   Title   Demonstrate improved FOTO score equal or greater than 58 to demonstrate improvement in overall condition and self-reported functional ability.    Baseline  34 (06/02/2019);    Time  6    Period  Weeks    Status  New    Target Date  07/14/19      PT LONG TERM GOAL #5   Title  Complete community,  work and/or recreational activities without limitation due to current condition.    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 PT is able to progress therex for increased postural demand with good success. Patient demonstrates good carry over of all cuing for neutral spine posture and is motivated throughout session. PT will continue progression as able.    Personal Factors and Comorbidities  Comorbidity 3+;Profession;Fitness;Past/Current Experience    Comorbidities  Relevant past medical history and comorbidities include ADHD, anxiety, asthma, chronic back pain, GERD, seizures (childhood), depression (well managed per pt), hx of c-section and B tubal ligation.    Examination-Activity Limitations  Dressing;Sit;Sleep;Caring for Others;Carry;Reach Overhead;Lift;Squat;Bend;Other    Examination-Participation Restrictions  Cleaning;Community Activity;Laundry;Interpersonal Relationship;Yard Work;Driving;Meal Prep    Stability/Clinical Decision Making  Evolving/Moderate complexity    Clinical Decision Making  Moderate    Rehab Potential  Good    PT Frequency  2x / week    PT Duration  6 weeks    PT Treatment/Interventions  ADLs/Self Care Home Management;Aquatic Therapy;Biofeedback;Cryotherapy;Moist  Heat;Electrical Stimulation;Traction;Therapeutic activities;Therapeutic exercise;Neuromuscular re-education;Patient/family education;Manual techniques;Passive range of motion;Dry needling;Taping;Spinal Manipulations;Joint Manipulations    PT Next Visit Plan  dry needling    PT Home Exercise Plan  supine chin tuck x 20 twice a day    Consulted and Agree with Plan of Care  Patient       Patient will benefit from skilled therapeutic intervention in order to improve the following deficits and impairments:  Decreased endurance, Decreased mobility, Hypomobility, Increased muscle spasms, Impaired sensation, Impaired perceived functional ability, Decreased range of motion, Decreased activity tolerance, Decreased strength, Increased fascial restricitons, Impaired flexibility, Postural dysfunction, Pain  Visit Diagnosis: Nonintractable headache, unspecified chronicity pattern, unspecified headache type  Cervicalgia  Paresthesia of skin  Other muscle spasm     Problem List Patient Active Problem List   Diagnosis Date Noted  . Myofascial pain syndrome, cervical 05/26/2019  . Cervico-occipital neuralgia 05/26/2019  . Post-operative state 09/08/2015  . Amniotic fluid leaking 08/12/2015  . Nausea and vomiting in pregnancy 07/05/2015  . Family history of Down syndrome 03/16/2015  . Hyperemesis affecting pregnancy, antepartum 03/10/2015  . Cough 03/10/2015  . Viral respiratory illness 03/10/2015   Staci Acosta PT, DPT Staci Acosta 07/22/2019, 3:53 PM  [facility] [address] [place], [address] Phone: [phone]   Fax:  [phone]  Name: Erin [name] MRN: [id number] Date of Birth: 03/03/1991

## 2019-07-27 ENCOUNTER — Other Ambulatory Visit: Payer: Self-pay

## 2019-07-27 ENCOUNTER — Ambulatory Visit: Payer: Self-pay | Admitting: Physical Therapy

## 2019-07-27 ENCOUNTER — Encounter: Payer: Self-pay | Admitting: Physical Therapy

## 2019-07-27 DIAGNOSIS — R519 Headache, unspecified: Secondary | ICD-10-CM

## 2019-07-27 DIAGNOSIS — M542 Cervicalgia: Secondary | ICD-10-CM

## 2019-07-27 NOTE — Therapy (Signed)
[facility] [address], [place], [address] Phone: [phone]   Fax:  [phone]  Physical Therapy Treatment  Patient Details  Name: Erin [name] MRN: [id number] Date of Birth: 1991/02/25 Referring Provider (PT): Melvenia Beam, MD   Encounter Date: 07/27/2019  PT End of Session - 07/27/19 1454    Visit Number  9    Number of Visits  12    Date for PT Re-Evaluation  08/25/19    Authorization Type  [employer] reporting period from 06/02/2019    Authorization - Visit Number  9    Authorization - Number of Visits  10    PT Start Time  0938    PT Stop Time  0313    PT Time Calculation (min)  38 min    Activity Tolerance  Patient tolerated treatment well    Behavior During Therapy  [facility] for tasks assessed/performed       Past Medical History:  Diagnosis Date  . ADD (attention deficit disorder)    ADHD  . Anxiety   . Asthma   . Chronic back pain   . Depression    postpartum depression in past  . Fatigue   . GERD (gastroesophageal reflux disease)   . Numbness and tingling   . Seasonal allergies   . Seasonal allergies   . Seizures ([place])    during childhood    Past Surgical History:  Procedure Laterality Date  . CESAREAN SECTION  2014  . CESAREAN SECTION WITH BILATERAL TUBAL LIGATION Bilateral 09/08/2015   Procedure: CESAREAN SECTION WITH BILATERAL TUBAL LIGATION;  Surgeon: Boykin Nearing, MD;  Location: [facility] ORS;  Service: Obstetrics;  Laterality: Bilateral;  . TONSILLECTOMY      There were no vitals filed for this visit.  Subjective Assessment - 07/27/19 1437    Subjective  Pt reports she was feeling much better over the weekend until her son accidentally fell on her head while she was crouched down playing with him. Reports pain feels fairly mild right now, just some tension in R temple and at base of skull that is uncomfortable. She reports having difficulty with doing her  daughters hair, and scrubbing the bathroom.    Pertinent History  Patient is a 29 y.o. female who presents to outpatient physical therapy with a referral for medical diagnosis bilateral occipital neuralgia, cervical myofascial pian syndrome. This patient's chief complaints consist of constant headache and neck pain with intermittent paresthesia in the face that started in December 2020, leading to the following functional deficits: slows her down, she pushes through a lot of it, finds it hard to sit without support, keeps her up at night, has had to cut back on work, bending, lifting, difficulty caring for 2 small children, interferes with crafting hobby ,working as a Quarry manager, traveling, driving, turing the head, computer work. Relevant past medical history and comorbidities include ADHD, anxiety, asthma, chronic back pain, GERD, seizures (childhood), depression (well managed per pt), hx of c-section and B tubal ligation. Denies hx of stroke, diabetes, cardiac events, cancer. Denies spinal surgeries, jaw problems.    Limitations  Sitting;Reading;House hold activities;Lifting;Other (comment)    How long can you sit comfortably?  20 minutes    Diagnostic tests  cerivcal spine and head MRI normal except some straightening of cervical lordosis (performed after onset of symptoms).    Patient Stated Goals  to get back to being normal and not have any pain at all  or can tolerate it without slowing her down or stopping her from doing her normal things.    Pain Onset  More than a month ago          Manual STM withtrigger point releasetoLUT,L temporalisand bilat suboccipitals Manual cervical traction 10sec on 10sec off x10 Dry Needling: (1/1/1/1/1)88mm.30needles placed along theR temporalis, bilat suboccipitalsand bilat UT to decrease increased muscular spasms and trigger points with the patient positioned insidelying (identifying pulse for temporalis) andsupine(with pincer grasp for UT, shelving for  suboccipitals bilat). Patient was educated on risks and benefits of therapy and verbally consents to PT.    Ther-Ex Scap taps with RTB looped 2x 10 (bilat taps) with cuing for reach and scapular stability with contralateral reach with goodc carry over Upright row 20# KB 3x 10 with cuing initially to prevent shoulder hiking with good carr yover Squat with 20# KB 3x 10 with cuing initially for neutral spine with good carry over following Bent over rows 10# DB bilat 3x 10with cuing for set up with good carry over following                      PT Education - 07/27/19 1453    Education Details  TDN, therex form, posture    Person(s) Educated  Patient    Methods  Explanation;Demonstration;Verbal cues;Tactile cues    Comprehension  Verbalized understanding;Returned demonstration;Verbal cues required;Tactile cues required       PT Short Term Goals - 06/02/19 1924      PT SHORT TERM GOAL #1   Title  Be independent with initial home exercise program for self-management of symptoms.    Baseline  initial HEP provided at IE (06/02/2019);    Time  2    Period  Weeks    Status  New    Target Date  06/16/19        PT Long Term Goals - 06/02/19 1925      PT LONG TERM GOAL #1   Title  Be independent with a long-term home exercise program for self-management of symptoms.    Baseline  Initial HEP provided at IE (06/02/2019);    Time  6    Period  Weeks    Status  New    Target Date  07/14/19      PT LONG TERM GOAL #2   Title  Reduce pain with functional activities to equal or less than 1/10 to allow patient to complete usual activities including ADLs, IADLs, and social engagement with less difficulty.    Baseline  up to 10/10 (06/02/2019);    Time  6    Period  Weeks    Status  New    Target Date  07/14/19      PT LONG TERM GOAL #3   Title  Have full cervical spine AROM with no compensations or increase in pain in all planes except intermittent end range discomfort to  allow patient to complete valued activities with less difficulty.    Baseline  limited and painful - see objective exam (06/02/2019);    Time  6    Period  Weeks    Status  New    Target Date  07/14/19      PT LONG TERM GOAL #4   Title  Demonstrate improved FOTO score equal or greater than 58 to demonstrate improvement in overall condition and self-reported functional ability.    Baseline  34 (06/02/2019);    Time  6  Period  Weeks    Status  New    Target Date  07/14/19      PT LONG TERM GOAL #5   Title  Complete community, work and/or recreational activities without limitation due to current condition.    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 PT continued therex progression for postural musculature in functional activies with good carry over of all cuing provided and good motivation throughtout session. Patient is increasing ability to self correct posture, though she does still need min cuing for most therex. PT will continue progression as able.    Personal Factors and Comorbidities  Comorbidity 3+;Profession;Fitness;Past/Current Experience    Comorbidities  Relevant past medical history and comorbidities include ADHD, anxiety, asthma, chronic back pain, GERD, seizures (childhood), depression (well managed per pt), hx of c-section and B tubal ligation.    Examination-Activity Limitations  Dressing;Sit;Sleep;Caring for Others;Carry;Reach Overhead;Lift;Squat;Bend;Other    Examination-Participation Restrictions  Cleaning;Community  Activity;Laundry;Interpersonal Relationship;Yard Work;Driving;Meal Prep    Stability/Clinical Decision Making  Evolving/Moderate complexity    Clinical Decision Making  Moderate    Rehab Potential  Good    PT Frequency  2x / week    PT Duration  6 weeks    PT Treatment/Interventions  ADLs/Self Care Home Management;Aquatic Therapy;Biofeedback;Cryotherapy;Moist Heat;Electrical Stimulation;Traction;Therapeutic activities;Therapeutic exercise;Neuromuscular re-education;Patient/family education;Manual techniques;Passive range of motion;Dry needling;Taping;Spinal Manipulations;Joint Manipulations    PT Next Visit Plan  dry needling; posture strengthing    PT Home Exercise Plan  supine chin tuck x 20 twice a day, UT stretch, levator stretch, scap retractions    Consulted and Agree with Plan of Care  Patient       Patient will benefit from skilled therapeutic intervention in order to improve the following deficits and impairments:  Decreased endurance, Decreased mobility, Hypomobility, Increased muscle spasms, Impaired sensation, Impaired perceived functional ability, Decreased range of motion, Decreased activity tolerance, Decreased strength, Increased fascial restricitons, Impaired flexibility, Postural dysfunction, Pain  Visit Diagnosis: Nonintractable headache, unspecified chronicity pattern, unspecified headache type  Cervicalgia     Problem List Patient Active Problem List   Diagnosis Date Noted  . Myofascial pain syndrome, cervical 05/26/2019  . Cervico-occipital neuralgia 05/26/2019  . Post-operative state 09/08/2015  . Amniotic fluid leaking 08/12/2015  . Nausea and vomiting in pregnancy 07/05/2015  . Family history of Down syndrome 03/16/2015  . Hyperemesis affecting pregnancy, antepartum 03/10/2015  . Cough 03/10/2015  . Viral respiratory illness 03/10/2015   Erin [name] PT, DPT Erin [name] 07/27/2019, 3:10 PM  [facility] [address], [place], [address] Phone: [phone]   Fax:  [phone]  Name: Erin [name] MRN: [id number] Date of Birth: [birth year]

## 2019-07-28 ENCOUNTER — Ambulatory Visit: Payer: Self-pay | Admitting: Physical Therapy

## 2019-07-29 ENCOUNTER — Encounter: Payer: BC Managed Care – PPO | Admitting: Physical Therapy

## 2019-07-30 ENCOUNTER — Ambulatory Visit: Payer: 59 | Admitting: Physical Therapy

## 2019-08-02 ENCOUNTER — Encounter: Payer: BC Managed Care – PPO | Admitting: Physical Therapy

## 2019-08-03 ENCOUNTER — Other Ambulatory Visit: Payer: Self-pay

## 2019-08-03 ENCOUNTER — Encounter: Payer: Self-pay | Admitting: Physical Therapy

## 2019-08-03 ENCOUNTER — Ambulatory Visit: Payer: 59 | Attending: Neurology | Admitting: Physical Therapy

## 2019-08-03 DIAGNOSIS — M542 Cervicalgia: Secondary | ICD-10-CM | POA: Insufficient documentation

## 2019-08-03 DIAGNOSIS — R202 Paresthesia of skin: Secondary | ICD-10-CM | POA: Insufficient documentation

## 2019-08-03 DIAGNOSIS — M62838 Other muscle spasm: Secondary | ICD-10-CM | POA: Diagnosis present

## 2019-08-03 DIAGNOSIS — R519 Headache, unspecified: Secondary | ICD-10-CM | POA: Insufficient documentation

## 2019-08-03 NOTE — Therapy (Signed)
[place] [facility] [address] [place], [address] Phone: [phone]   Fax:  [phone]  Physical Therapy Treatment  Patient Details  Name: Erin [name] MRN: [id number] Date of Birth: 1990/07/06 Referring Provider (PT): Melvenia Beam, MD   Encounter Date: 08/03/2019  PT End of Session - 08/03/19 1452    Visit Number  10    Number of Visits  12    Date for PT Re-Evaluation  08/25/19    Authorization Type  [employer] reporting period from 06/02/2019    Authorization - Visit Number  10    Authorization - Number of Visits  10    PT Start Time  0234    PT Stop Time  0313    PT Time Calculation (min)  39 min    Activity Tolerance  Patient tolerated treatment well    Behavior During Therapy  [facility] for tasks assessed/performed       Past Medical History:  Diagnosis Date  . ADD (attention deficit disorder)    ADHD  . Anxiety   . Asthma   . Chronic back pain   . Depression    postpartum depression in past  . Fatigue   . GERD (gastroesophageal reflux disease)   . Numbness and tingling   . Seasonal allergies   . Seasonal allergies   . Seizures ([facility])    during childhood    Past Surgical History:  Procedure Laterality Date  . CESAREAN SECTION  2014  . CESAREAN SECTION WITH BILATERAL TUBAL LIGATION Bilateral 09/08/2015   Procedure: CESAREAN SECTION WITH BILATERAL TUBAL LIGATION;  Surgeon: Boykin Nearing, MD;  Location: [facility] ORS;  Service: Obstetrics;  Laterality: Bilateral;  . TONSILLECTOMY      There were no vitals filed for this visit.  Subjective Assessment - 08/03/19 1436    Subjective  Reports she has been having some increased migraines which she thinks is d/t her allergies, which is better with allegry medication. Reports some increased neck tension from playing her switch today, minimal temporalis tension.    Pertinent History  Patient is a 29 y.o. female who presents to outpatient  physical therapy with a referral for medical diagnosis bilateral occipital neuralgia, cervical myofascial pian syndrome. This patient's chief complaints consist of constant headache and neck pain with intermittent paresthesia in the face that started in December 2020, leading to the following functional deficits: slows her down, she pushes through a lot of it, finds it hard to sit without support, keeps her up at night, has had to cut back on work, bending, lifting, difficulty caring for 2 small children, interferes with crafting hobby ,working as a Quarry manager, traveling, driving, turing the head, computer work. Relevant past medical history and comorbidities include ADHD, anxiety, asthma, chronic back pain, GERD, seizures (childhood), depression (well managed per pt), hx of c-section and B tubal ligation. Denies hx of stroke, diabetes, cardiac events, cancer. Denies spinal surgeries, jaw problems.    Limitations  Sitting;Reading;House hold activities;Lifting;Other (comment)    How long can you sit comfortably?  20 minutes    Diagnostic tests  cerivcal spine and head MRI normal except some straightening of cervical lordosis (performed after onset of symptoms).    Patient Stated Goals  to get back to being normal and not have any pain at all or can tolerate it without slowing her down or stopping her from doing her normal things.    Pain Onset  More than a month  ago       Manual STM withtrigger point releasetobilat UTand bilat suboccipitals Manual cervical traction 10sec on 10sec off x10 Dry Needling: (2/2/1/1)16mm.30needles placed along thebilat UT and bilat suboccipitals(with pincer grasp for UT, shelving for suboccipitals bilat). Patient was educated on risks and benefits of therapy and verbally consents to PT.    Ther-Ex Qped on theraball 3x 10 with cuing initially for posture with good carry over following Bent over flys 3# 3x 10 with demo and cuing for set up, with some cuing needed to  prevent thoracic kyphosis with good carry over Squat with 20# KB x10; 25# 2x 10 with min cuing to prevent upper cervical flex with good carry over Bent over rows 10# cable 3x 10with cuing and demo for set up posture with decent carry over following Seated theraball rollouts 6x 10sec with some cuing for breath control with good carry over                        PT Education - 08/03/19 1451    Education Details  TDN, therex form, posture    Person(s) Educated  Patient    Methods  Explanation;Demonstration;Verbal cues    Comprehension  Verbalized understanding;Returned demonstration;Verbal cues required       PT Short Term Goals - 06/02/19 1924      PT SHORT TERM GOAL #1   Title  Be independent with initial home exercise program for self-management of symptoms.    Baseline  initial HEP provided at IE (06/02/2019);    Time  2    Period  Weeks    Status  New    Target Date  06/16/19        PT Long Term Goals - 06/02/19 1925      PT LONG TERM GOAL #1   Title  Be independent with a long-term home exercise program for self-management of symptoms.    Baseline  Initial HEP provided at IE (06/02/2019);    Time  6    Period  Weeks    Status  New    Target Date  07/14/19      PT LONG TERM GOAL #2   Title  Reduce pain with functional activities to equal or less than 1/10 to allow patient to complete usual activities including ADLs, IADLs, and social engagement with less difficulty.    Baseline  up to 10/10 (06/02/2019);    Time  6    Period  Weeks    Status  New    Target Date  07/14/19      PT LONG TERM GOAL #3   Title  Have full cervical spine AROM with no compensations or increase in pain in all planes except intermittent end range discomfort to allow patient to complete valued activities with less difficulty.    Baseline  limited and painful - see objective exam (06/02/2019);    Time  6    Period  Weeks    Status  New    Target Date  07/14/19      PT LONG TERM  GOAL #4   Title  Demonstrate improved FOTO score equal or greater than 58 to demonstrate improvement in overall condition and self-reported functional ability.    Baseline  34 (06/02/2019);    Time  6    Period  Weeks    Status  New    Target Date  07/14/19      PT LONG TERM GOAL #5  Title  Complete community, work and/or recreational activities without limitation due to current condition.    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 Patient is increasing ability to self correct posture, and carries over cuing for proper neutral posture with dynamic movements well. Patient is motivated throughout session. PT will contiue progression as able.    Personal Factors and Comorbidities  Comorbidity 3+;Profession;Fitness;Past/Current Experience    Comorbidities  Relevant past medical history and comorbidities include ADHD, anxiety, asthma, chronic back pain, GERD, seizures (childhood), depression (well managed per pt), hx of c-section and B tubal ligation.    Examination-Activity Limitations  Dressing;Sit;Sleep;Caring for Others;Carry;Reach Overhead;Lift;Squat;Bend;Other    Examination-Participation Restrictions  Cleaning;Community Activity;Laundry;Interpersonal Relationship;Yard Work;Driving;Meal Prep    Stability/Clinical Decision Making  Evolving/Moderate complexity    Clinical Decision Making  Moderate    Rehab Potential  Good    PT Frequency  2x / week    PT Duration  6 weeks    PT Treatment/Interventions  ADLs/Self Care Home Management;Aquatic  Therapy;Biofeedback;Cryotherapy;Moist Heat;Electrical Stimulation;Traction;Therapeutic activities;Therapeutic exercise;Neuromuscular re-education;Patient/family education;Manual techniques;Passive range of motion;Dry needling;Taping;Spinal Manipulations;Joint Manipulations    PT Next Visit Plan  dry needling; posture strengthing    PT Home Exercise Plan  supine chin tuck x 20 twice a day, UT stretch, levator stretch, scap retractions    Consulted and Agree with Plan of Care  Patient       Patient will benefit from skilled therapeutic intervention in order to improve the following deficits and impairments:  Decreased endurance, Decreased mobility, Hypomobility, Increased muscle spasms, Impaired sensation, Impaired perceived functional ability, Decreased range of motion, Decreased activity tolerance, Decreased strength, Increased fascial restricitons, Impaired flexibility, Postural dysfunction, Pain  Visit Diagnosis: Nonintractable headache, unspecified chronicity pattern, unspecified headache type  Cervicalgia  Paresthesia of skin  Other muscle spasm     Problem List Patient Active Problem List   Diagnosis Date Noted  . Myofascial pain syndrome, cervical 05/26/2019  . Cervico-occipital neuralgia 05/26/2019  . Post-operative state 09/08/2015  . Amniotic fluid leaking 08/12/2015  . Nausea and vomiting in pregnancy 07/05/2015  . Family history of Down syndrome 03/16/2015  . Hyperemesis affecting pregnancy, antepartum 03/10/2015  . Cough 03/10/2015  . Viral respiratory illness 03/10/2015    Erin [name] 08/03/2019, 3:15 PM  [place] [facility] [address] [place], [address] Phone: [phone]   Fax:  [phone]  Name: Erin [name] MRN: [id number] Date of Birth: 1990/05/22

## 2019-08-04 ENCOUNTER — Encounter: Payer: BC Managed Care – PPO | Admitting: Physical Therapy

## 2019-08-05 ENCOUNTER — Other Ambulatory Visit: Payer: Self-pay

## 2019-08-05 ENCOUNTER — Ambulatory Visit: Payer: 59 | Admitting: Physical Therapy

## 2019-08-05 DIAGNOSIS — R519 Headache, unspecified: Secondary | ICD-10-CM | POA: Diagnosis not present

## 2019-08-05 DIAGNOSIS — M542 Cervicalgia: Secondary | ICD-10-CM

## 2019-08-05 NOTE — Therapy (Signed)
[facility] [address] [place], [address] Phone: [phone]   Fax:  [phone]  Physical Therapy Treatment/Progress Note Reporting Period 06/02/19 - 08/05/19  Patient Details  Name: Erin [name] MRN: [id number] Date of Birth: Jul 28, 1990 Referring Provider (PT): Anson Fret, MD   Encounter Date: 08/05/2019  PT End of Session - 08/05/19 1720    Visit Number  11    Number of Visits  12    Date for PT Re-Evaluation  08/25/19    Authorization - Visit Number  1    Authorization - Number of Visits  10    PT Start Time  0513    PT Stop Time  0555    PT Time Calculation (min)  42 min    Activity Tolerance  Patient tolerated treatment well    Behavior During Therapy  [facility] for tasks assessed/performed       Past Medical History:  Diagnosis Date  . ADD (attention deficit disorder)    ADHD  . Anxiety   . Asthma   . Chronic back pain   . Depression    postpartum depression in past  . Fatigue   . GERD (gastroesophageal reflux disease)   . Numbness and tingling   . Seasonal allergies   . Seasonal allergies   . Seizures ([facility])    during childhood    Past Surgical History:  Procedure Laterality Date  . CESAREAN SECTION  2014  . CESAREAN SECTION WITH BILATERAL TUBAL LIGATION Bilateral 09/08/2015   Procedure: CESAREAN SECTION WITH BILATERAL TUBAL LIGATION;  Surgeon: Suzy Bouchard, MD;  Location: [facility] ORS;  Service: Obstetrics;  Laterality: Bilateral;  . TONSILLECTOMY      There were no vitals filed for this visit.  Subjective Assessment - 08/05/19 1719    Subjective  Reports no pain today, which she is pleased with. Has been able to get her migraines under control over the past week and reports this is helping her pain experience.    Pertinent History  Patient is a 29 y.o. female who presents to outpatient physical therapy with a referral for medical diagnosis bilateral occipital  neuralgia, cervical myofascial pian syndrome. This patient's chief complaints consist of constant headache and neck pain with intermittent paresthesia in the face that started in December 2020, leading to the following functional deficits: slows her down, she pushes through a lot of it, finds it hard to sit without support, keeps her up at night, has had to cut back on work, bending, lifting, difficulty caring for 2 small children, interferes with crafting hobby ,working as a Lawyer, traveling, driving, turing the head, computer work. Relevant past medical history and comorbidities include ADHD, anxiety, asthma, chronic back pain, GERD, seizures (childhood), depression (well managed per pt), hx of c-section and B tubal ligation. Denies hx of stroke, diabetes, cardiac events, cancer. Denies spinal surgeries, jaw problems.    Limitations  Sitting;Reading;House hold activities;Lifting;Other (comment)    How long can you sit comfortably?  20 minutes    Diagnostic tests  cerivcal spine and head MRI normal except some straightening of cervical lordosis (performed after onset of symptoms).    Patient Stated Goals  to get back to being normal and not have any pain at all or can tolerate it without slowing her down or stopping her from doing her normal things.         Ther-Ex Standing row 25# 3x 10 with min cuing to prevent  shoulder hiking, good carry over High rows BlackTB 3x 10 with min cuing initially for set up with good carry over following Standing band pull aparts BluTB 3x 10 with min cuing for scap retraction with good carry over following Scaption with 3# 3x 10 with demo and cuing initially for set up with good carry over following Bird Dog 3x 10 with min cuing for neutral spine with good carry over  Education on HEP update to reflect postural strengthening, as patient reports remaining pain is when sitting without support, with good carry over of cuing for proper technique with the following therex.  Education on frequency, rep/set range, how and when to increase/decrease intensity as needed, and purpose of therex with verbalized understanding Medbridge Code: [id number]                          PT Education - 08/05/19 1720    Education Details  therex form, posture, goal reassessment    Person(s) Educated  Patient    Methods  Explanation;Demonstration;Verbal cues    Comprehension  Verbalized understanding;Returned demonstration;Verbal cues required       PT Short Term Goals - 08/05/19 1715      PT SHORT TERM GOAL #1   Title  Be independent with initial home exercise program for self-management of symptoms.    Baseline  initial HEP provided at IE (06/02/2019); 08/05/19 Completing without questions    Time  2    Period  Weeks    Status  Achieved        PT Long Term Goals - 08/05/19 1715      PT LONG TERM GOAL #1   Title  Be independent with a long-term home exercise program for self-management of symptoms.    Baseline  07/05/19 HEP updated for postural focus, as opposed to pain management    Status  Revised      PT LONG TERM GOAL #2   Title  Reduce pain with functional activities to equal or less than 1/10 to allow patient to complete usual activities including ADLs, IADLs, and social engagement with less difficulty.    Baseline  08/05/19 Reports 3-4/10  tension headache pain average that comes and goes; very minimal shoulder/neck pain mostly just when sitting without support    Time  6    Period  Weeks    Status  On-going      PT LONG TERM GOAL #3   Title  Have full cervical spine AROM with no compensations or increase in pain in all planes except intermittent end range discomfort to allow patient to complete valued activities with less difficulty.    Baseline  08/05/19 full cervical ROM    Time  6    Period  Weeks    Status  Achieved      PT LONG TERM GOAL #4   Title  Demonstrate improved FOTO score equal or greater than 58 to demonstrate improvement in  overall condition and self-reported functional ability.    Baseline  34 (06/02/2019); 08/05/19 78    Time  6    Period  Weeks    Status  Achieved      PT LONG TERM GOAL #5   Title  Complete community, work and/or recreational activities without limitation due to current condition.    Baseline  08/05/19 over past 2 weeks able to complete without pain [id number] ability to complete her usual activities including sitting without support, sleeping, working as a  CNA, bending, lifting, caring for 2 small children, crafting, traveling, driving, turning head, computer work, etc (06/02/2019);    Time  6    Period  Weeks    Status  Achieved            Plan - 08/05/19 1752    Clinical Impression Statement  In absence of pain, PT progressed therex for postural strengthening/motor control and updated HEP to reflect this. PT re-assessed goals, where patient has made excellent progress, with some remaining pain when sitting without support d/t postural weakness. PT and patient discussed adressing these deficits in order to d/c to HEP, and to utilize pain management techniques as needed with good understanding. PT will continue progression as able.    Personal Factors and Comorbidities  Comorbidity 3+;Profession;Fitness;Past/Current Experience    Comorbidities  Relevant past medical history and comorbidities include ADHD, anxiety, asthma, chronic back pain, GERD, seizures (childhood), depression (well managed per pt), hx of c-section and B tubal ligation.    Examination-Activity Limitations  Dressing;Sit;Sleep;Caring for Others;Carry;Reach Overhead;Lift;Squat;Bend;Other    Examination-Participation Restrictions  Cleaning;Community Activity;Laundry;Interpersonal Relationship;Yard Work;Driving;Meal Prep    Stability/Clinical Decision Making  Evolving/Moderate complexity    Clinical Decision Making  Moderate    Rehab Potential  Good    PT Frequency  2x / week    PT Duration  6 weeks    PT Treatment/Interventions   ADLs/Self Care Home Management;Aquatic Therapy;Biofeedback;Cryotherapy;Moist Heat;Electrical Stimulation;Traction;Therapeutic activities;Therapeutic exercise;Neuromuscular re-education;Patient/family education;Manual techniques;Passive range of motion;Dry needling;Taping;Spinal Manipulations;Joint Manipulations    PT Next Visit Plan  dry needling; posture strengthing    PT Home Exercise Plan  supine chin tuck x 20 twice a day, UT stretch, levator stretch, scap retractions; [id number]    Consulted and Agree with Plan of Care  Patient       Patient will benefit from skilled therapeutic intervention in order to improve the following deficits and impairments:  Decreased endurance, Decreased mobility, Hypomobility, Increased muscle spasms, Impaired sensation, Impaired perceived functional ability, Decreased range of motion, Decreased activity tolerance, Decreased strength, Increased fascial restricitons, Impaired flexibility, Postural dysfunction, Pain  Visit Diagnosis: Nonintractable headache, unspecified chronicity pattern, unspecified headache type  Cervicalgia     Problem List Patient Active Problem List   Diagnosis Date Noted  . Myofascial pain syndrome, cervical 05/26/2019  . Cervico-occipital neuralgia 05/26/2019  . Post-operative state 09/08/2015  . Amniotic fluid leaking 08/12/2015  . Nausea and vomiting in pregnancy 07/05/2015  . Family history of Down syndrome 03/16/2015  . Hyperemesis affecting pregnancy, antepartum 03/10/2015  . Cough 03/10/2015  . Viral respiratory illness 03/10/2015   Shelton Silvas PT, DPT Shelton Silvas 08/05/2019, 5:56 PM  [place] [facility] [address], [place], [address] Phone: [phone]   Fax:  [phone]  Name: Erin [name] MRN: [id number] Date of Birth: 1990-12-10

## 2019-08-09 ENCOUNTER — Encounter: Payer: BC Managed Care – PPO | Admitting: Physical Therapy

## 2019-08-10 ENCOUNTER — Encounter: Payer: Self-pay | Admitting: Physical Therapy

## 2019-08-10 ENCOUNTER — Other Ambulatory Visit: Payer: Self-pay

## 2019-08-10 ENCOUNTER — Ambulatory Visit: Payer: 59 | Admitting: Physical Therapy

## 2019-08-10 DIAGNOSIS — M62838 Other muscle spasm: Secondary | ICD-10-CM

## 2019-08-10 DIAGNOSIS — M542 Cervicalgia: Secondary | ICD-10-CM

## 2019-08-10 DIAGNOSIS — R519 Headache, unspecified: Secondary | ICD-10-CM

## 2019-08-10 NOTE — Therapy (Signed)
[facility] PHYSICAL AND SPORTS MEDICINE [address], [place], [address] Phone: [phone]   Fax:  [phone]  Physical Therapy Treatment  Patient Details  Name: Erin [name] MRN: [id number] Date of Birth: August 23, 1990 Referring Provider (PT): Anson Fret, MD   Encounter Date: 08/10/2019  PT End of Session - 08/10/19 1437    Visit Number  12    Number of Visits  20    Date for PT Re-Evaluation  08/25/19    Authorization Type  [employer] reporting period from 06/02/2019    Authorization - Visit Number  2    Authorization - Number of Visits  10    PT Start Time  0230    PT Stop Time  0312    PT Time Calculation (min)  42 min    Activity Tolerance  Patient tolerated treatment well    Behavior During Therapy  [facility] for tasks assessed/performed       Past Medical History:  Diagnosis Date  . ADD (attention deficit disorder)    ADHD  . Anxiety   . Asthma   . Chronic back pain   . Depression    postpartum depression in past  . Fatigue   . GERD (gastroesophageal reflux disease)   . Numbness and tingling   . Seasonal allergies   . Seasonal allergies   . Seizures (HCC)    during childhood    Past Surgical History:  Procedure Laterality Date  . CESAREAN SECTION  2014  . CESAREAN SECTION WITH BILATERAL TUBAL LIGATION Bilateral 09/08/2015   Procedure: CESAREAN SECTION WITH BILATERAL TUBAL LIGATION;  Surgeon: Suzy Bouchard, MD;  Location: [facility] ORS;  Service: Obstetrics;  Laterality: Bilateral;  . TONSILLECTOMY      There were no vitals filed for this visit.  Subjective Assessment - 08/10/19 1435    Subjective  Patient reports that she got her COVID vaccine last week and the R side of her neck has been really tight from gaurding her R arm. Reports some R sided tension headache from this. Reports 6/10 pain on this side today.    Pertinent History  Patient is a 29 y.o. female who presents to outpatient physical  therapy with a referral for medical diagnosis bilateral occipital neuralgia, cervical myofascial pian syndrome. This patient's chief complaints consist of constant headache and neck pain with intermittent paresthesia in the face that started in December 2020, leading to the following functional deficits: slows her down, she pushes through a lot of it, finds it hard to sit without support, keeps her up at night, has had to cut back on work, bending, lifting, difficulty caring for 2 small children, interferes with crafting hobby ,working as a Lawyer, traveling, driving, turing the head, computer work. Relevant past medical history and comorbidities include ADHD, anxiety, asthma, chronic back pain, GERD, seizures (childhood), depression (well managed per pt), hx of c-section and B tubal ligation. Denies hx of stroke, diabetes, cardiac events, cancer. Denies spinal surgeries, jaw problems.    Limitations  Sitting;Reading;House hold activities;Lifting;Other (comment)    How long can you sit comfortably?  20 minutes    Diagnostic tests  cerivcal spine and head MRI normal except some straightening of cervical lordosis (performed after onset of symptoms).    Patient Stated Goals  to get back to being normal and not have any pain at all or can tolerate it without slowing her down or stopping her from doing her normal things.  Pain Onset  More than a month ago          Manual STM withtrigger point releaseto R UT, R cervical paraspinals,and R suboccipitals Manual cervical traction 10sec on 10sec off x10 Dry Needling: (2/2)9mm.30needles placed along theR UT and R suboccipitals(with pincer grasp for UT, shelving for suboccipitals). Patient was educated on risks and benefits of therapy and verbally consents to PT.    Ther-Ex Birddog 3x 10 with cuing initially for cervical posture with good carry over following Straight Leg deadlift 3x 10 with 10# DB bilat with heavy cuing and demo initially needed for  neutral spine with good carry over Bent over flys 3# 3x 10 with demo and cuing for set up, with some cuing needed to prevent thoracic kyphosis with good carry over Squat with 25# KB 3x 10 with min cuing for posture initially with good carry over following Seated theraball rollouts 10x 5sec with some cuing for breath control with good carry over; exhaling 5sec over rollout                       PT Education - 08/10/19 1437    Education Details  therex form    Person(s) Educated  Patient    Methods  Explanation;Demonstration;Verbal cues    Comprehension  Verbalized understanding;Returned demonstration;Verbal cues required       PT Short Term Goals - 08/05/19 1715      PT SHORT TERM GOAL #1   Title  Be independent with initial home exercise program for self-management of symptoms.    Baseline  initial HEP provided at IE (06/02/2019); 08/05/19 Completing without questions    Time  2    Period  Weeks    Status  Achieved        PT Long Term Goals - 08/05/19 1715      PT LONG TERM GOAL #1   Title  Be independent with a long-term home exercise program for self-management of symptoms.    Baseline  07/05/19 HEP updated for postural focus, as opposed to pain management    Status  Revised      PT LONG TERM GOAL #2   Title  Reduce pain with functional activities to equal or less than 1/10 to allow patient to complete usual activities including ADLs, IADLs, and social engagement with less difficulty.    Baseline  08/05/19 Reports 3-4/10  tension headache pain average that comes and goes; very minimal shoulder/neck pain mostly just when sitting without support    Time  6    Period  Weeks    Status  On-going      PT LONG TERM GOAL #3   Title  Have full cervical spine AROM with no compensations or increase in pain in all planes except intermittent end range discomfort to allow patient to complete valued activities with less difficulty.    Baseline  08/05/19 full cervical ROM     Time  6    Period  Weeks    Status  Achieved      PT LONG TERM GOAL #4   Title  Demonstrate improved FOTO score equal or greater than 58 to demonstrate improvement in overall condition and self-reported functional ability.    Baseline  34 (06/02/2019); 08/05/19 78    Time  6    Period  Weeks    Status  Achieved      PT LONG TERM GOAL #5   Title  Complete community, work and/or recreational  activities without limitation due to current condition.    Baseline  08/05/19 over past 2 weeks able to complete without pain [id number] ability to complete her usual activities including sitting without support, sleeping, working as a Lawyer, bending, lifting, caring for 2 small children, crafting, traveling, driving, turning head, computer work, etc (06/02/2019);    Time  6    Period  Weeks    Status  Achieved            Plan - 08/10/19 1500    Clinical Impression Statement  PT utilized manual with TDN to reduce tension with patient reporting no pain following. PT continued therex progression for posture with good carry over of all cuing for proper technique and good motivation thorughout session. PT will conitnue porgression as able.    Personal Factors and Comorbidities  Comorbidity 3+;Profession;Fitness;Past/Current Experience    Comorbidities  Relevant past medical history and comorbidities include ADHD, anxiety, asthma, chronic back pain, GERD, seizures (childhood), depression (well managed per pt), hx of c-section and B tubal ligation.    Examination-Activity Limitations  Dressing;Sit;Sleep;Caring for Others;Carry;Reach Overhead;Lift;Squat;Bend;Other    Examination-Participation Restrictions  Cleaning;Community Activity;Laundry;Interpersonal Relationship;Yard Work;Driving;Meal Prep    Stability/Clinical Decision Making  Evolving/Moderate complexity    Clinical Decision Making  Moderate    Rehab Potential  Good    PT Frequency  2x / week    PT Duration  6 weeks    PT Treatment/Interventions   ADLs/Self Care Home Management;Aquatic Therapy;Biofeedback;Cryotherapy;Moist Heat;Electrical Stimulation;Traction;Therapeutic activities;Therapeutic exercise;Neuromuscular re-education;Patient/family education;Manual techniques;Passive range of motion;Dry needling;Taping;Spinal Manipulations;Joint Manipulations    PT Next Visit Plan  dry needling; posture strengthing    PT Home Exercise Plan  supine chin tuck x 20 twice a day, UT stretch, levator stretch, scap retractions; [id number]    Consulted and Agree with Plan of Care  Patient       Patient will benefit from skilled therapeutic intervention in order to improve the following deficits and impairments:  Decreased endurance, Decreased mobility, Hypomobility, Increased muscle spasms, Impaired sensation, Impaired perceived functional ability, Decreased range of motion, Decreased activity tolerance, Decreased strength, Increased fascial restricitons, Impaired flexibility, Postural dysfunction, Pain  Visit Diagnosis: Nonintractable headache, unspecified chronicity pattern, unspecified headache type  Cervicalgia  Other muscle spasm     Problem List Patient Active Problem List   Diagnosis Date Noted  . Myofascial pain syndrome, cervical 05/26/2019  . Cervico-occipital neuralgia 05/26/2019  . Post-operative state 09/08/2015  . Amniotic fluid leaking 08/12/2015  . Nausea and vomiting in pregnancy 07/05/2015  . Family history of Down syndrome 03/16/2015  . Hyperemesis affecting pregnancy, antepartum 03/10/2015  . Cough 03/10/2015  . Viral respiratory illness 03/10/2015   Staci Acosta PT, DPT Staci Acosta 08/10/2019, 3:09 PM  [facility] PHYSICAL AND SPORTS MEDICINE [address], [place], [address] Phone: [phone]   Fax:  [phone]  Name: Thelia Tanksley MRN: [id number] Date of Birth: 04-Oct-1990

## 2019-08-11 ENCOUNTER — Emergency Department
Admission: EM | Admit: 2019-08-11 | Discharge: 2019-08-11 | Disposition: A | Discharge status: Home / Self Care | Payer: PRIVATE HEALTH INSURANCE | Attending: Emergency Medicine | Admitting: Emergency Medicine

## 2019-08-11 ENCOUNTER — Encounter: Payer: BC Managed Care – PPO | Admitting: Physical Therapy

## 2019-08-11 ENCOUNTER — Emergency Department: Payer: PRIVATE HEALTH INSURANCE

## 2019-08-11 ENCOUNTER — Other Ambulatory Visit: Payer: Self-pay

## 2019-08-11 DIAGNOSIS — J45909 Unspecified asthma, uncomplicated: Secondary | ICD-10-CM | POA: Diagnosis not present

## 2019-08-11 DIAGNOSIS — T50Z95A Adverse effect of other vaccines and biological substances, initial encounter: Secondary | ICD-10-CM | POA: Diagnosis not present

## 2019-08-11 DIAGNOSIS — G43909 Migraine, unspecified, not intractable, without status migrainosus: Secondary | ICD-10-CM | POA: Insufficient documentation

## 2019-08-11 DIAGNOSIS — F909 Attention-deficit hyperactivity disorder, unspecified type: Secondary | ICD-10-CM | POA: Diagnosis not present

## 2019-08-11 DIAGNOSIS — Z87891 Personal history of nicotine dependence: Secondary | ICD-10-CM | POA: Insufficient documentation

## 2019-08-11 DIAGNOSIS — M79605 Pain in left leg: Secondary | ICD-10-CM | POA: Diagnosis present

## 2019-08-11 DIAGNOSIS — Z79899 Other long term (current) drug therapy: Secondary | ICD-10-CM | POA: Insufficient documentation

## 2019-08-11 MED ORDER — KETOROLAC TROMETHAMINE 10 MG PO TABS: 10.0000 mg | ORAL_TABLET | Freq: Three times a day (TID) | ORAL | 0 refills | Status: DC

## 2019-08-11 MED ORDER — METOCLOPRAMIDE HCL 10 MG PO TABS
10.0000 mg | ORAL_TABLET | Freq: Once | ORAL | Status: AC
  Administered 2019-08-11: 10 mg via ORAL
  Filled 2019-08-11: qty 1

## 2019-08-11 MED ORDER — CYCLOBENZAPRINE HCL 10 MG PO TABS
10.0000 mg | ORAL_TABLET | Freq: Once | ORAL | Status: AC
  Administered 2019-08-11: 10 mg via ORAL
  Filled 2019-08-11: qty 1

## 2019-08-11 MED ORDER — CYCLOBENZAPRINE HCL 5 MG PO TABS: 5.0000 mg | ORAL_TABLET | Freq: Three times a day (TID) | ORAL | 0 refills | Status: DC | PRN

## 2019-08-11 MED ORDER — KETOROLAC TROMETHAMINE 30 MG/ML IJ SOLN
30.0000 mg | Freq: Once | INTRAMUSCULAR | Status: AC
  Administered 2019-08-11: 30 mg via INTRAMUSCULAR
  Filled 2019-08-11: qty 1

## 2019-08-11 MED ORDER — METOCLOPRAMIDE HCL 5 MG PO TABS: 5.0000 mg | ORAL_TABLET | Freq: Three times a day (TID) | ORAL | 0 refills | Status: DC | PRN

## 2019-08-11 NOTE — ED Triage Notes (Signed)
Pt comes POV after getting J&J shot Sunday. Pt states that her migraines have gotten worse since then. Pt states that her rx med for migraines hasn't helped. Pt also states left lower leg pain and tightness. Pt also states that the site from her injection still hurts to where she can't lay on it.

## 2019-08-11 NOTE — Discharge Instructions (Signed)
Your exam and Ultrasound are normal at this time. There is no evidence of DVT or blood clot. You are also experiencing a migraine and muscle pains, which may be a side effect of your recent SARS/CoV vaccine. Take the prescription meds as directed. Follow-up with your provider as needed.

## 2019-08-11 NOTE — ED Provider Notes (Addendum)
[facility] Emergency Department Provider Note ____________________________________________  Time seen: 1603  I have reviewed the triage vital signs and the nursing notes.  HISTORY  Chief Complaint  Leg Pain  HPI Erin [name] is a 29 y.o. female presents to the ED accompanied by her mother, for evaluation of symptoms she believes may relate to her recent vaccine.   She received the [place] vaccine today Leane Para 207A21A).  She describes with an the minute she developed a migraine headache typical of her baseline.  She took Excedrin as usual, and the headache did not resolve.  She also describes onset later on the afternoon of some heaviness to her left leg.  She described sensations of numbness and tingling to the posterior buttocks and thigh, with referral down towards the foot.  She denies any recent travel, chest pain, shortness of breath, syncope, or weakness.  She also denies any interim fevers, chills, or sweats.  Patient presents now at the advice of the vaccine provider at [facility], for evaluation of her symptoms.  Past Medical History:  Diagnosis Date  . ADD (attention deficit disorder)    ADHD  . Anxiety   . Asthma   . Chronic back pain   . Depression    postpartum depression in past  . Fatigue   . GERD (gastroesophageal reflux disease)   . Numbness and tingling   . Seasonal allergies   . Seasonal allergies   . Seizures (HCC)    during childhood    Patient Active Problem List   Diagnosis Date Noted  . Myofascial pain syndrome, cervical 05/26/2019  . Cervico-occipital neuralgia 05/26/2019  . Post-operative state 09/08/2015  . Amniotic fluid leaking 08/12/2015  . Nausea and vomiting in pregnancy 07/05/2015  . Family history of Down syndrome 03/16/2015  . Hyperemesis affecting pregnancy, antepartum 03/10/2015  . Cough 03/10/2015  . Viral respiratory illness 03/10/2015    Past Surgical History:  Procedure Laterality Date  .  CESAREAN SECTION  2014  . CESAREAN SECTION WITH BILATERAL TUBAL LIGATION Bilateral 09/08/2015   Procedure: CESAREAN SECTION WITH BILATERAL TUBAL LIGATION;  Surgeon: Suzy Bouchard, MD;  Location: [facility] ORS;  Service: Obstetrics;  Laterality: Bilateral;  . TONSILLECTOMY      Prior to Admission medications   Medication Sig Start Date End Date Taking? Authorizing Provider  albuterol (VENTOLIN HFA) 108 (90 Base) MCG/ACT inhaler Inhale 2 puffs into the lungs every 6 (six) hours as needed for wheezing or shortness of breath.    [provider]  buPROPion (WELLBUTRIN XL) 150 MG 24 hr tablet Take 150 mg by mouth daily.    [provider]  cyclobenzaprine (FLEXERIL) 5 MG tablet Take 1 tablet (5 mg total) by mouth 3 (three) times daily as needed. 08/11/19   Jamisha Hoeschen, Charlesetta Ivory, PA-C  ketorolac (TORADOL) 10 MG tablet Take 1 tablet (10 mg total) by mouth every 8 (eight) hours. 08/11/19   Breianna Delfino, Charlesetta Ivory, PA-C  meloxicam (MOBIC) 15 MG tablet Take 15 mg by mouth daily.    [provider]  metaxalone (SKELAXIN) 800 MG tablet Take 800 mg by mouth 3 (three) times daily as needed for muscle spasms.    [provider]  metoCLOPramide (REGLAN) 5 MG tablet Take 1 tablet (5 mg total) by mouth every 8 (eight) hours as needed for up to 5 days for nausea or vomiting. 08/11/19 08/16/19  Media Pizzini, Charlesetta Ivory, PA-C  rizatriptan (MAXALT-MLT) 10 MG disintegrating tablet Take 1 tablet (10  mg total) by mouth as needed for migraine. May repeat in 2 hours if needed 06/02/19   Lomax, Amy, NP  tiZANidine (ZANAFLEX) 4 MG capsule Take 4 mg by mouth 3 (three) times daily as needed for muscle spasms.    [provider]  topiramate (TOPAMAX) 50 MG tablet Take 1 tablet (50 mg total) by mouth 2 (two) times daily. 06/02/19   Lomax, Amy, NP  venlafaxine (EFFEXOR) 37.5 MG tablet Take 37.5 mg by mouth daily.    [provider]    Allergies Sulfa antibiotics and Sulfa drugs  cross reactors  Family History  Problem Relation Age of Onset  . Hypertension Mother   . Arthritis Mother   . Cancer Mother   . Diabetes Mother   . Migraines Mother   . Cancer Father   . Diabetes Maternal Grandmother     Social History Social History   Tobacco Use  . Smoking status: Former Smoker    Packs/day: 0.10  . Smokeless tobacco: Never Used  Substance Use Topics  . Alcohol use: Yes    Comment: occasional   . Drug use: No    Comment: tussionex cough syrup x 1 week 03-16-15    Review of Systems  Constitutional: Negative for fever. Eyes: Negative for visual changes. ENT: Negative for sore throat. Cardiovascular: Negative for chest pain. Respiratory: Negative for shortness of breath. Gastrointestinal: Negative for abdominal pain, vomiting and diarrhea. Genitourinary: Negative for dysuria. Musculoskeletal: Negative for back pain. Skin: Negative for rash. Neurological: Positive for headache.  Reports LLE paresthesias as above.  No focal weakness or numbness. ____________________________________________  PHYSICAL EXAM:  VITAL SIGNS: ED Triage Vitals  Enc Vitals Group     BP 08/11/19 1457 140/89     Pulse Rate 08/11/19 1457 75     Resp 08/11/19 1457 18     Temp 08/11/19 1457 98.2 F (36.8 C)     Temp Source 08/11/19 1457 Oral     SpO2 08/11/19 1457 100 %     Weight 08/11/19 1458 223 lb (101.2 kg)     Height 08/11/19 1458 5\' 8"  (1.727 m)     Head Circumference --      Peak Flow --      Pain Score 08/11/19 1458 7     Pain Loc --      Pain Edu? --      Excl. in [place]? --     Constitutional: Alert and oriented. Well appearing and in no distress.  GCS = 15 Head: Normocephalic and atraumatic. Eyes: Conjunctivae are normal. Normal extraocular movements Cardiovascular: Normal rate, regular rhythm. Normal distal pulses and cap refill.  No murmurs, rubs, or gallops.   Respiratory: Normal respiratory effort. No wheezes/rales/rhonchi. Gastrointestinal: Soft and  nontender. No distention. Musculoskeletal: Nontender with normal range of motion in all extremities.  Neurologic: Cranial nerves II through XII grossly intact.  Normal LE DTRs bilaterally.  Normal gait without ataxia. Normal speech and language. No gross focal neurologic deficits are appreciated. Skin:  Skin is warm, dry and intact. No rash noted.  No cyanosis, clubbing, or edema noted. Psychiatric: Mood and affect are normal. Patient exhibits appropriate insight and judgment. ____________________________________________   LABS (pertinent positives/negatives) Labs Reviewed - No data to display ___________________________________________   RADIOLOGY  LLE Venous Doppler US  IMPRESSION: No evidence of deep venous thrombosis in the left lower extremity. Right common femoral vein also patent. ____________________________________________  PROCEDURES  Toradol 30 mg IM Flexeril 10 mg p.o. Reglan 10 mg  p.o.  Procedures ____________________________________________  INITIAL IMPRESSION / ASSESSMENT AND PLAN / ED COURSE  Patient with ED evaluation of symptoms following the administration of single dose SARS/Covid vaccine today.  She describes onset of a headache consistent with her migraine history, as well as some left lower extremity paresthesias.  Patient was evaluated in the ED for symptoms, and was found overall to have a normal exam.  Her ultrasound study of the left lower extremity did not reveal any acute DVT.  She had no acute neuromuscular deficit on exam.  She reports pain improved to a 2 out of 10 overall after ED medication administration.  Patient is advised to continue with medications as prescribed.  She is also encouraged to report her symptoms to the vaccine adverse event reporting system online.  A work note is provided for today as requested, and patient will follow up as necessary.  Return precautions have been discussed.  Zelma Snead was evaluated in Emergency  Department on 08/11/2019 for the symptoms described in the history of present illness. She was evaluated in the context of the global COVID-19 pandemic, which necessitated consideration that the patient might be at risk for infection with the SARS-CoV-2 virus that causes COVID-19. Institutional protocols and algorithms that pertain to the evaluation of patients at risk for COVID-19 are in a state of rapid change based on information released by regulatory bodies including the CDC and federal and state organizations. These policies and algorithms were followed during the patient's care in the ED. ____________________________________________  FINAL CLINICAL IMPRESSION(S) / ED DIAGNOSES  Final diagnoses:  Left leg pain  Migraine without status migrainosus, not intractable, unspecified migraine type  Adverse effect of vaccine, initial encounter      Lissa Hoard, PA-C 08/11/19 1817    Lissa Hoard, PA-C 08/11/19 1819    Emily Filbert, MD 08/11/19 2221

## 2019-08-12 ENCOUNTER — Encounter: Payer: Self-pay | Admitting: Physical Therapy

## 2019-08-12 ENCOUNTER — Ambulatory Visit: Payer: 59 | Admitting: Physical Therapy

## 2019-08-12 DIAGNOSIS — M542 Cervicalgia: Secondary | ICD-10-CM

## 2019-08-12 DIAGNOSIS — R519 Headache, unspecified: Secondary | ICD-10-CM

## 2019-08-12 DIAGNOSIS — M62838 Other muscle spasm: Secondary | ICD-10-CM

## 2019-08-12 NOTE — Therapy (Signed)
[facility] [address], [place], [address] Phone: [phone]   Fax:  [phone]  Physical Therapy Treatment  Patient Details  Name: Erin [name] MRN: [id number] Date of Birth: [birth year] Referring Provider (PT): Anson Fret, MD   Encounter Date: 08/12/2019  PT End of Session - 08/12/19 1537    Visit Number  13    Number of Visits  20    Date for PT Re-Evaluation  08/25/19    Authorization - Visit Number  3    Authorization - Number of Visits  10    PT Start Time  0317    PT Stop Time  0355    PT Time Calculation (min)  38 min    Activity Tolerance  Patient tolerated treatment well    Behavior During Therapy  [facility] for tasks assessed/performed       Past Medical History:  Diagnosis Date  . ADD (attention deficit disorder)    ADHD  . Anxiety   . Asthma   . Chronic back pain   . Depression    postpartum depression in past  . Fatigue   . GERD (gastroesophageal reflux disease)   . Numbness and tingling   . Seasonal allergies   . Seasonal allergies   . Seizures (HCC)    during childhood    Past Surgical History:  Procedure Laterality Date  . CESAREAN SECTION  2014  . CESAREAN SECTION WITH BILATERAL TUBAL LIGATION Bilateral 09/08/2015   Procedure: CESAREAN SECTION WITH BILATERAL TUBAL LIGATION;  Surgeon: Suzy Bouchard, MD;  Location: [facility] ORS;  Service: Obstetrics;  Laterality: Bilateral;  . TONSILLECTOMY      There were no vitals filed for this visit.  Subjective Assessment - 08/12/19 1521    Subjective  Patient reports she had calf pain yesterday and went to the ED suspect of DVT following vaccine, was cleared for DVT, given muscle relaxers, and reports she is very tired today from medication. She says she was anxious of possibility of having adverse side effect of vaccine, and that this brought on some tension, is only at base of skull and is "not too bad". She is very  fatigued following getting so anxious.    Pertinent History  Patient is a 29 y.o. female who presents to outpatient physical therapy with a referral for medical diagnosis bilateral occipital neuralgia, cervical myofascial pian syndrome. This patient's chief complaints consist of constant headache and neck pain with intermittent paresthesia in the face that started in December 2020, leading to the following functional deficits: slows her down, she pushes through a lot of it, finds it hard to sit without support, keeps her up at night, has had to cut back on work, bending, lifting, difficulty caring for 2 small children, interferes with crafting hobby ,working as a Lawyer, traveling, driving, turing the head, computer work. Relevant past medical history and comorbidities include ADHD, anxiety, asthma, chronic back pain, GERD, seizures (childhood), depression (well managed per pt), hx of c-section and B tubal ligation. Denies hx of stroke, diabetes, cardiac events, cancer. Denies spinal surgeries, jaw problems.    Limitations  Sitting;Reading;House hold activities;Lifting;Other (comment)    How long can you sit comfortably?  20 minutes    Diagnostic tests  cerivcal spine and head MRI normal except some straightening of cervical lordosis (performed after onset of symptoms).    Patient Stated Goals  to get back to being normal and not have any pain  at all or can tolerate it without slowing her down or stopping her from doing her normal things.       Manual STM withtrigger point releaseto bilat UT, bilat cervical paraspinals, and bilat suboccipitals Manual cervical traction 10sec on 10sec off x10 Dry Needling: (2/2)56mm.30needles placed along thebilat UT and R suboccipitals(with pincer grasp for UT, shelving for suboccipitals). Patient was educated on risks and benefits of therapy and verbally consents to PT.    Ther-Ex Seated rows 35# 3x 10 with min cuing initially for posture with good carry over  following Lat pulldown 35# 3x 10 with min cuing to prevent shoulder hiking initially with good carry over following Cat/Cow with breath control x20 with heavy cuing initially for breath control with good carry over following Seated theraball rollouts 10x 5sec with some cuing for breath control with good carry over; exhaling 5sec over rollout Childs pose on ball 2x holds                        PT Education - 08/12/19 1535    Education Details  therex form, stress response    Person(s) Educated  Patient    Methods  Explanation;Demonstration;Verbal cues    Comprehension  Verbalized understanding;Returned demonstration;Verbal cues required       PT Short Term Goals - 08/05/19 1715      PT SHORT TERM GOAL #1   Title  Be independent with initial home exercise program for self-management of symptoms.    Baseline  initial HEP provided at IE (06/02/2019); 08/05/19 Completing without questions    Time  2    Period  Weeks    Status  Achieved        PT Long Term Goals - 08/05/19 1715      PT LONG TERM GOAL #1   Title  Be independent with a long-term home exercise program for self-management of symptoms.    Baseline  07/05/19 HEP updated for postural focus, as opposed to pain management    Status  Revised      PT LONG TERM GOAL #2   Title  Reduce pain with functional activities to equal or less than 1/10 to allow patient to complete usual activities including ADLs, IADLs, and social engagement with less difficulty.    Baseline  08/05/19 Reports 3-4/10  tension headache pain average that comes and goes; very minimal shoulder/neck pain mostly just when sitting without support    Time  6    Period  Weeks    Status  On-going      PT LONG TERM GOAL #3   Title  Have full cervical spine AROM with no compensations or increase in pain in all planes except intermittent end range discomfort to allow patient to complete valued activities with less difficulty.    Baseline  08/05/19  full cervical ROM    Time  6    Period  Weeks    Status  Achieved      PT LONG TERM GOAL #4   Title  Demonstrate improved FOTO score equal or greater than 58 to demonstrate improvement in overall condition and self-reported functional ability.    Baseline  34 (06/02/2019); 08/05/19 78    Time  6    Period  Weeks    Status  Achieved      PT LONG TERM GOAL #5   Title  Complete community, work and/or recreational activities without limitation due to current condition.  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 Continued education on de-stress techniques with verbalized understanding to aid in reducing this with verbalized understanding. Patient is able to comply with cuing for therex technique to ensure proper posture and breath control with success. PT will continue progression as able.    Personal Factors and Comorbidities  Comorbidity 3+;Profession;Fitness;Past/Current Experience    Comorbidities  Relevant past medical history and comorbidities include ADHD, anxiety, asthma, chronic back pain, GERD, seizures (childhood), depression (well managed per pt), hx of c-section and B tubal ligation.    Examination-Activity Limitations  Dressing;Sit;Sleep;Caring for Others;Carry;Reach Overhead;Lift;Squat;Bend;Other    Examination-Participation Restrictions  Cleaning;Community Activity;Laundry;Interpersonal Relationship;Yard Work;Driving;Meal Prep    Stability/Clinical Decision Making  Evolving/Moderate complexity    Clinical  Decision Making  Moderate    Rehab Potential  Good    PT Frequency  2x / week    PT Duration  6 weeks    PT Treatment/Interventions  ADLs/Self Care Home Management;Aquatic Therapy;Biofeedback;Cryotherapy;Moist Heat;Electrical Stimulation;Traction;Therapeutic activities;Therapeutic exercise;Neuromuscular re-education;Patient/family education;Manual techniques;Passive range of motion;Dry needling;Taping;Spinal Manipulations;Joint Manipulations    PT Next Visit Plan  dry needling; posture strengthing    PT Home Exercise Plan  supine chin tuck x 20 twice a day, UT stretch, levator stretch, scap retractions; [id number]    Consulted and Agree with Plan of Care  Patient       Patient will benefit from skilled therapeutic intervention in order to improve the following deficits and impairments:  Decreased endurance, Decreased mobility, Hypomobility, Increased muscle spasms, Impaired sensation, Impaired perceived functional ability, Decreased range of motion, Decreased activity tolerance, Decreased strength, Increased fascial restricitons, Impaired flexibility, Postural dysfunction, Pain  Visit Diagnosis: Nonintractable headache, unspecified chronicity pattern, unspecified headache type  Cervicalgia  Other muscle spasm     Problem List Patient Active Problem List   Diagnosis Date Noted  . Myofascial pain syndrome, cervical 05/26/2019  . Cervico-occipital neuralgia 05/26/2019  . Post-operative state 09/08/2015  . Amniotic fluid leaking 08/12/2015  . Nausea and vomiting in pregnancy 07/05/2015  . Family history of Down syndrome 03/16/2015  . Hyperemesis affecting pregnancy, antepartum 03/10/2015  . Cough 03/10/2015  . Viral respiratory illness 03/10/2015   Shelton Silvas PT, DPT Shelton Silvas 08/12/2019, 3:53 PM  [place] [facility] [address], [place], [address] Phone: [phone]   Fax:  [phone]  Name: Erin [name] MRN: [id number] Date of Birth: February 25, 1991

## 2019-08-16 ENCOUNTER — Encounter: Payer: BC Managed Care – PPO | Admitting: Physical Therapy

## 2019-08-17 ENCOUNTER — Ambulatory Visit: Payer: 59 | Admitting: Physical Therapy

## 2019-08-17 ENCOUNTER — Encounter: Payer: Self-pay | Admitting: Physical Therapy

## 2019-08-17 ENCOUNTER — Other Ambulatory Visit: Payer: Self-pay

## 2019-08-17 DIAGNOSIS — R519 Headache, unspecified: Secondary | ICD-10-CM

## 2019-08-17 DIAGNOSIS — M542 Cervicalgia: Secondary | ICD-10-CM

## 2019-08-17 DIAGNOSIS — M62838 Other muscle spasm: Secondary | ICD-10-CM

## 2019-08-17 NOTE — Therapy (Signed)
[place] [facility] [address] [place], [address] Phone: [phone]   Fax:  [phone]  Physical Therapy Treatment  Patient Details  Name: Erin [name] MRN: [id number] Date of Birth: 1990-08-16 Referring Provider (PT): Melvenia Beam, MD   Encounter Date: 08/17/2019  PT End of Session - 08/18/19 0830    Visit Number  14    Number of Visits  20    Date for PT Re-Evaluation  08/25/19    Authorization Type  [employer] reporting period from 06/02/2019    Authorization - Visit Number  4    Authorization - Number of Visits  10    PT Start Time  0543    PT Stop Time  0608    PT Time Calculation (min)  25 min    Activity Tolerance  Patient tolerated treatment well    Behavior During Therapy  [facility] for tasks assessed/performed       Past Medical History:  Diagnosis Date  . ADD (attention deficit disorder)    ADHD  . Anxiety   . Asthma   . Chronic back pain   . Depression    postpartum depression in past  . Fatigue   . GERD (gastroesophageal reflux disease)   . Numbness and tingling   . Seasonal allergies   . Seasonal allergies   . Seizures ([place])    during childhood    Past Surgical History:  Procedure Laterality Date  . CESAREAN SECTION  2014  . CESAREAN SECTION WITH BILATERAL TUBAL LIGATION Bilateral 09/08/2015   Procedure: CESAREAN SECTION WITH BILATERAL TUBAL LIGATION;  Surgeon: Boykin Nearing, MD;  Location: [facility] ORS;  Service: Obstetrics;  Laterality: Bilateral;  . TONSILLECTOMY      There were no vitals filed for this visit.  Subjective Assessment - 08/17/19 1750    Subjective  Patient reports she is continuing to feel very good, no pain or migraine today. She is ready to d/c PT.    Pertinent History  Patient is a 29 y.o. female who presents to outpatient physical therapy with a referral for medical diagnosis bilateral occipital neuralgia, cervical myofascial pian syndrome. This  patient's chief complaints consist of constant headache and neck pain with intermittent paresthesia in the face that started in December 2020, leading to the following functional deficits: slows her down, she pushes through a lot of it, finds it hard to sit without support, keeps her up at night, has had to cut back on work, bending, lifting, difficulty caring for 2 small children, interferes with crafting hobby ,working as a Quarry manager, traveling, driving, turing the head, computer work. Relevant past medical history and comorbidities include ADHD, anxiety, asthma, chronic back pain, GERD, seizures (childhood), depression (well managed per pt), hx of c-section and B tubal ligation. Denies hx of stroke, diabetes, cardiac events, cancer. Denies spinal surgeries, jaw problems.    Limitations  Sitting;Reading;House hold activities;Lifting;Other (comment)    How long can you sit comfortably?  20 minutes    Diagnostic tests  cerivcal spine and head MRI normal except some straightening of cervical lordosis (performed after onset of symptoms).    Patient Stated Goals  to get back to being normal and not have any pain at all or can tolerate it without slowing her down or stopping her from doing her normal things.    Pain Onset  More than a month ago           Ther-Ex Nustep 21mns  L4 for scapular retraction/protraction strengthening with good carry over of cuing for SPM >60 Review of the following therex with patient able to complete a set of all of the following, demonstrating understanding, and verbalizing understanding of previous HEP therex. Education on frequency, rep/set range, how/when to increase/decrease intensity   Access Code: [email or web address]:  Bird Dog - 1 x daily - 2-3 x weekly - 3 sets - 10 reps  Prone Scapular Retraction Y - 1 x daily - 2-3 x weekly - 3 sets - 10 reps  Prone Scapular Retraction Arms at Side - 1 x daily - 2-3 x weekly - 3 sets - 10 reps  Prone Shoulder Extension - 1 x daily - 2-3 x  weekly - 3 sets - 10 reps  Child's Pose Stretch - 1 x daily - 2-3 x weekly - 60sec hold  Cat-Camel - 1 x daily - 7 x weekly - 44mn hold                      PT Education - 08/18/19 0829    Education Details  d/c recommendations    Person(s) Educated  Patient    Methods  Explanation;Demonstration;Verbal cues    Comprehension  Verbalized understanding;Returned demonstration;Verbal cues required       PT Short Term Goals - 08/05/19 1715      PT SHORT TERM GOAL #1   Title  Be independent with initial home exercise program for self-management of symptoms.    Baseline  initial HEP provided at IE (06/02/2019); 08/05/19 Completing without questions    Time  2    Period  Weeks    Status  Achieved        PT Long Term Goals - 08/17/19 1751      PT LONG TERM GOAL #1   Title  Be independent with a long-term home exercise program for self-management of symptoms.    Baseline  07/05/19 HEP updated for postural focus, as opposed to pain management    Time  6    Period  Weeks    Status  Achieved      PT LONG TERM GOAL #2   Title  Reduce pain with functional activities to equal or less than 1/10 to allow patient to complete usual activities including ADLs, IADLs, and social engagement with less difficulty.    Baseline  08/05/19 Reports 3-4/10  tension headache pain average that comes and goes; very minimal shoulder/neck pain mostly just when sitting without support; 4/20 has had no pain over the past week    Time  6    Period  Weeks    Status  Achieved      PT LONG TERM GOAL #3   Title  Have full cervical spine AROM with no compensations or increase in pain in all planes except intermittent end range discomfort to allow patient to complete valued activities with less difficulty.    Baseline  08/05/19 full cervical ROM    Time  6    Period  Weeks    Status  Achieved      PT LONG TERM GOAL #4   Title  Demonstrate improved FOTO score equal or greater than 58 to demonstrate  improvement in overall condition and self-reported functional ability.    Baseline  34 (06/02/2019); 08/05/19 78    Time  6    Period  Weeks    Status  Achieved      PT LONG TERM GOAL #5  Title  Complete community, work and/or recreational activities without limitation due to current condition.    Baseline  08/05/19 over past 2 weeks able to complete without pain QO:HCOBTVM ability to complete her usual activities including sitting without support, sleeping, working as a Quarry manager, bending, lifting, caring for 2 small children, crafting, traveling, driving, turning head, computer work, etc (06/02/2019);    Time  6    Period  Weeks    Status  Achieved            Plan - 08/18/19 1029    Clinical Impression Statement  Patient is able to demonstrate good carry over of all HEP recommendations from previous session and today's additions and has met all goals at this time to safely d/c PT to HEP for continued strengthening and pain management. Pt to d/c PT, given PT clinic contact info should any other questions.concerns aris.    Personal Factors and Comorbidities  Comorbidity 3+;Profession;Fitness;Past/Current Experience    Comorbidities  Relevant past medical history and comorbidities include ADHD, anxiety, asthma, chronic back pain, GERD, seizures (childhood), depression (well managed per pt), hx of c-section and B tubal ligation.    Examination-Activity Limitations  Dressing;Sit;Sleep;Caring for Others;Carry;Reach Overhead;Lift;Squat;Bend;Other    Examination-Participation Restrictions  Cleaning;Community Activity;Laundry;Interpersonal Relationship;Yard Work;Driving;Meal Prep    Stability/Clinical Decision Making  Evolving/Moderate complexity    Clinical Decision Making  Moderate    Rehab Potential  Good    PT Frequency  2x / week    PT Duration  6 weeks    PT Treatment/Interventions  ADLs/Self Care Home Management;Aquatic Therapy;Biofeedback;Cryotherapy;Moist Heat;Electrical  Stimulation;Traction;Therapeutic activities;Therapeutic exercise;Neuromuscular re-education;Patient/family education;Manual techniques;Passive range of motion;Dry needling;Taping;Spinal Manipulations;Joint Manipulations    PT Next Visit Plan  dry needling; posture strengthing    PT Home Exercise Plan  supine chin tuck x 20 twice a day, UT stretch, levator stretch, scap retractions; [id number]    Consulted and Agree with Plan of Care  Patient       Patient will benefit from skilled therapeutic intervention in order to improve the following deficits and impairments:  Decreased endurance, Decreased mobility, Hypomobility, Increased muscle spasms, Impaired sensation, Impaired perceived functional ability, Decreased range of motion, Decreased activity tolerance, Decreased strength, Increased fascial restricitons, Impaired flexibility, Postural dysfunction, Pain  Visit Diagnosis: Nonintractable headache, unspecified chronicity pattern, unspecified headache type  Cervicalgia  Other muscle spasm     Problem List Patient Active Problem List   Diagnosis Date Noted  . Myofascial pain syndrome, cervical 05/26/2019  . Cervico-occipital neuralgia 05/26/2019  . Post-operative state 09/08/2015  . Amniotic fluid leaking 08/12/2015  . Nausea and vomiting in pregnancy 07/05/2015  . Family history of Down syndrome 03/16/2015  . Hyperemesis affecting pregnancy, antepartum 03/10/2015  . Cough 03/10/2015  . Viral respiratory illness 03/10/2015   Shelton Silvas PT, DPT Shelton Silvas 08/18/2019, 12:41 PM  [place] [facility] [address] [place], [address] Phone: [phone]   Fax:  [phone]  Name: Erin [name] MRN: [id number] Date of Birth: 01/28/1991

## 2019-08-19 ENCOUNTER — Ambulatory Visit: Payer: 59 | Admitting: Physical Therapy

## 2019-08-23 ENCOUNTER — Encounter: Payer: BC Managed Care – PPO | Admitting: Physical Therapy

## 2019-08-24 ENCOUNTER — Ambulatory Visit: Payer: 59 | Admitting: Physical Therapy

## 2019-08-26 ENCOUNTER — Encounter: Payer: BC Managed Care – PPO | Admitting: Physical Therapy

## 2019-08-31 ENCOUNTER — Encounter: Payer: 59 | Admitting: Physical Therapy

## 2019-09-02 ENCOUNTER — Ambulatory Visit: Payer: Commercial Managed Care - PPO | Admitting: Family Medicine

## 2019-11-02 ENCOUNTER — Ambulatory Visit: Payer: Medicaid Other | Admitting: Family Medicine

## 2019-11-02 ENCOUNTER — Encounter: Payer: Self-pay | Admitting: Family Medicine

## 2019-11-04 ENCOUNTER — Ambulatory Visit: Payer: Medicaid Other | Admitting: Physician Assistant

## 2019-11-04 ENCOUNTER — Other Ambulatory Visit: Payer: Self-pay

## 2019-11-04 ENCOUNTER — Encounter: Payer: Self-pay | Admitting: Physician Assistant

## 2019-11-04 DIAGNOSIS — Z202 Contact with and (suspected) exposure to infections with a predominantly sexual mode of transmission: Secondary | ICD-10-CM

## 2019-11-04 DIAGNOSIS — Z113 Encounter for screening for infections with a predominantly sexual mode of transmission: Secondary | ICD-10-CM

## 2019-11-04 LAB — WET PREP FOR TRICH, YEAST, CLUE
Trichomonas Exam: NEGATIVE
Yeast Exam: NEGATIVE

## 2019-11-04 MED ORDER — CEFTRIAXONE SODIUM 500 MG IJ SOLR
500.0000 mg | Freq: Once | INTRAMUSCULAR | Status: AC
  Administered 2019-11-04: 500 mg via INTRAMUSCULAR

## 2019-11-04 MED ORDER — AZITHROMYCIN 500 MG PO TABS
1000.0000 mg | ORAL_TABLET | Freq: Once | ORAL | Status: AC
  Administered 2019-11-04: 1000 mg via ORAL

## 2019-11-04 NOTE — Progress Notes (Signed)
[facility] Department STI clinic/screening visit  Subjective:  Erin [name] is a 29 y.o. female being seen today for an STI screening visit. The patient reports they do have symptoms.  Patient reports that they do not desire a pregnancy in the next year.   They reported they are not interested in discussing contraception today.  Patient's last menstrual period was 10/31/2019 (exact date).   Patient has the following medical conditions:   Patient Active Problem List   Diagnosis Date Noted  . Myofascial pain syndrome, cervical 05/26/2019  . Cervico-occipital neuralgia 05/26/2019  . Post-operative state 09/08/2015  . Amniotic fluid leaking 08/12/2015  . Nausea and vomiting in pregnancy 07/05/2015  . Family history of Down syndrome 03/16/2015  . Hyperemesis affecting pregnancy, antepartum 03/10/2015  . Cough 03/10/2015  . Viral respiratory illness 03/10/2015    Chief Complaint  Patient presents with  . SEXUALLY TRANSMITTED DISEASE    screening    HPI  Patient reports that she has been told by a partner that he has GC.  States that she has had bleeding after sex one time, the last time she had sex and also cramping for 24 hr after sex, but then started her period so was not sure if the cramping had to do with per period or having had sex. Denies current cramping or abdominal pain.  Denies other symptoms.  Reports h/o tonsillectomy, C-section and BTL.  Has asthma, anxiety, depression and migraine headaches.  States last HIV test was 04/2019 when she had her last pap.  See flowsheet for further details and programmatic requirements.    The following portions of the patient's history were reviewed and updated as appropriate: allergies, current medications, past medical history, past social history, past surgical history and problem list.  Objective:  There were no vitals filed for this visit.  Physical Exam Constitutional:      General: She is not in acute  distress.    Appearance: Normal appearance.  HENT:     Head: Normocephalic and atraumatic.     Comments: No nits, lice, or hair loss. No cervical, supraclavicular or axillary adenopathy.    Mouth/Throat:     Mouth: Mucous membranes are moist.     Pharynx: Oropharynx is clear. No oropharyngeal exudate or posterior oropharyngeal erythema.  Eyes:     Conjunctiva/sclera: Conjunctivae normal.  Pulmonary:     Effort: Pulmonary effort is normal.  Musculoskeletal:     Cervical back: Neck supple. No tenderness.  Skin:    General: Skin is warm and dry.  Neurological:     Mental Status: She is alert and oriented to person, place, and time.  Psychiatric:        Mood and Affect: Mood normal.        Behavior: Behavior normal.        Thought Content: Thought content normal.        Judgment: Judgment normal.    Patient opted to self-collect vaginal samples for GC/Chlamydia and wet mount testing today.  Assessment and Plan:  Erin [name] is a 29 y.o. female presenting to the [facility] Department for STI screening  1. Screening for STD (sexually transmitted disease) Patient into clinic with symptoms. Counseled patient how to collect vaginal samples. Rec condoms with all sex. Await test results.  Counseled that RN will call if needs to RTC for treatment once results are back. - WET PREP FOR TRICH, YEAST, CLUE - Gonococcus culture - Chlamydia/Gonorrhea [facility] -  HIV/HCV [facility] - Syphilis Serology, [facility]  2. Gonorrhea contact Treat as a contact to [facility] with Ceftriaxone 500mg  IM and Azithromycin 1 g po DOT today. No sex for 7 days and until after partner completes treatment. RTC for re-treatment if vomits < 2 hr after taking medicine. - cefTRIAXone (ROCEPHIN) injection 500 mg - azithromycin (ZITHROMAX) tablet 1,000 mg     No follow-ups on file.  No future appointments.  , PA

## 2019-11-04 NOTE — Progress Notes (Signed)
Wet mount reviewed, pt treated with Ceftriaxone and Azithromycin per provider orders. Provider orders completed.

## 2019-11-09 LAB — GONOCOCCUS CULTURE

## 2019-11-15 ENCOUNTER — Encounter: Payer: Self-pay | Admitting: Family Medicine

## 2019-11-15 LAB — HM HIV SCREENING LAB: HM HIV Screening: NEGATIVE

## 2019-11-15 LAB — HM HEPATITIS C SCREENING LAB: HM Hepatitis Screen: NEGATIVE

## 2019-12-23 ENCOUNTER — Encounter: Payer: Self-pay | Admitting: Family Medicine

## 2019-12-23 ENCOUNTER — Ambulatory Visit (INDEPENDENT_AMBULATORY_CARE_PROVIDER_SITE_OTHER): Payer: 59 | Admitting: Family Medicine

## 2019-12-23 ENCOUNTER — Other Ambulatory Visit: Payer: Self-pay

## 2019-12-23 VITALS — BP 127/83 | HR 64 | Ht 68.0 in | Wt 231.6 lb

## 2019-12-23 DIAGNOSIS — M5481 Occipital neuralgia: Secondary | ICD-10-CM | POA: Diagnosis not present

## 2019-12-23 DIAGNOSIS — IMO0002 Reserved for concepts with insufficient information to code with codable children: Secondary | ICD-10-CM

## 2019-12-23 DIAGNOSIS — G43709 Chronic migraine without aura, not intractable, without status migrainosus: Secondary | ICD-10-CM
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
## 2019-12-23 NOTE — Patient Instructions (Addendum)
We will continue topiramate 50mg  twice daily. Continue rizatriptan as needed. Please continue to wean Excedrin to avoid rebound headaches.   Stay well hydrated. Sleep hygiene, well balanced diet and regular exercise. Please get an eye exam asap.   Follow up in 6 months   Occipital Neuralgia  Occipital neuralgia is a type of headache that causes brief episodes of very bad pain in the back of your head. Pain from occipital neuralgia may spread (radiate) to other parts of your head. These headaches may be caused by irritation of the nerves that leave your spinal cord high up in your neck, just below the base of your skull (occipital nerves). Your occipital nerves transmit sensations from the back of your head, the top of your head, and the areas behind your ears. What are the causes? This condition can occur without any known cause (primary headache syndrome). In other cases, this condition is caused by pressure on or irritation of one of the two occipital nerves. Pressure and irritation may be due to:  Muscle spasm in the neck.  Neck injury.  Wear and tear of the vertebrae in the neck (osteoarthritis).  Disease of the disks that separate the vertebrae.  Swollen blood vessels that put pressure on the occipital nerves.  Infections.  Tumors.  Diabetes. What are the signs or symptoms? This condition causes brief burning, stabbing, electric, shocking, or shooting pain which can radiate to the top of the head. It can happen on one side or both sides of the head. It can also cause:  Pain behind the eye.  Pain triggered by neck movement or hair brushing.  Scalp tenderness.  Aching in the back of the head between episodes of very bad pain.  Pain gets worse with exposure to bright lights. How is this diagnosed? There is no test that diagnoses this condition. Your health care provider may diagnose this condition based on a physical exam and your symptoms. Other tests may be done, such  as:  Imaging studies of the brain and neck (cervical spine), such as an MRI or CT scan. These look for causes of pinched nerves.  Applying pressure to the nerves in the neck to try to re-create the pain.  Injection of numbing medicine into the occipital nerve areas to see if pain goes away (diagnostic nerve block). How is this treated? Treatment for this condition may begin with simple measures, such as:  Rest.  Massage.  Applying heat or cold on the area.  Over-the-counter pain relievers. If these measures do not work, you may need other treatments, including:  Medicines, such as: ? Prescription-strength anti-inflammatory medicines. ? Muscle relaxants. ? Anti-seizure medicines, which can relieve pain. ? Antidepressants, which can relieve pain. ? Injected medicines, such as medicines that numb the area (local anesthetic) and steroids.  Pulsed radiofrequency ablation. This is when wires are implanted to deliver electrical impulses that block pain signals from the occipital nerve.  Surgery to relieve nerve pressure.  Physical therapy. Follow these instructions at home: Pain management      Avoid any activities that cause pain.  Rest when you have an attack of pain.  Try gentle massage to relieve pain.  Try a different pillow or sleeping position.  If directed, apply heat to the affected area as told by your health care provider. Use the heat source that your health care provider recommends, such as a moist heat pack or a heating pad. ? Place a towel between your skin and the heat source. ?  Leave the heat on for 20-30 minutes. ? Remove the heat if your skin turns bright red. This is especially important if you are unable to feel pain, heat, or cold. You may have a greater risk of getting burned.  If directed, apply ice to the back of the head and neck area as told by your health care provider. ? Put ice in a plastic bag. ? Place a towel between your skin and the  bag. ? Leave the ice on for 20 minutes, 2-3 times per day. General instructions  Take over-the-counter and prescription medicines only as told by your health care provider.  Avoid things that make your symptoms worse, such as bright lights.  Try to stay active. Get regular exercise that does not cause pain. Ask your health care provider to suggest safe exercises for you.  Work with a physical therapist to learn stretching exercises you can do at home.  Practice good posture.  Keep all follow-up visits as told by your health care provider. This is important. Contact a health care provider if:  Your medicine is not working.  You have new or worsening symptoms. Get help right away if:  You have very bad head pain that does not go away.  You have a sudden change in vision, balance, or speech. Summary  Occipital neuralgia is a type of headache that causes brief episodes of very bad pain in the back of your head.  Pain from occipital neuralgia may spread (radiate) to other parts of your head.  Treatment for this condition includes rest, massage, and medicines. This information is not intended to replace advice given to you by your health care provider. Make sure you discuss any questions you have with your health care provider. Document Revised: 04/01/2017 Document Reviewed: 06/20/2016 Elsevier Patient Education  2020 [employer]   Migraine Headache A migraine headache is a very strong throbbing pain on one side or both sides of your head. This type of headache can also cause other symptoms. It can last from 4 hours to 3 days. Talk with your doctor about what things may bring on (trigger) this condition. What are the causes? The exact cause of this condition is not known. This condition may be triggered or caused by:  Drinking alcohol.  Smoking.  Taking medicines, such as: ? Medicine used to treat chest pain (nitroglycerin). ? Birth control pills. ? Estrogen. ? Some  blood pressure medicines.  Eating or drinking certain products.  Doing physical activity. Other things that may trigger a migraine headache include:  Having a menstrual period.  Pregnancy.  Hunger.  Stress.  Not getting enough sleep or getting too much sleep.  Weather changes.  Tiredness (fatigue). What increases the risk?  Being 15-78 years old.  Being female.  Having a family history of migraine headaches.  Being Caucasian.  Having depression or anxiety.  Being very overweight. What are the signs or symptoms?  A throbbing pain. This pain may: ? Happen in any area of the head, such as on one side or both sides. ? Make it hard to do daily activities. ? Get worse with physical activity. ? Get worse around bright lights or loud noises.  Other symptoms may include: ? Feeling sick to your stomach (nauseous). ? Vomiting. ? Dizziness. ? Being sensitive to bright lights, loud noises, or smells.  Before you get a migraine headache, you may get warning signs (an aura). An aura may include: ? Seeing flashing lights or having blind spots. ?  Seeing bright spots, halos, or zigzag lines. ? Having tunnel vision or blurred vision. ? Having numbness or a tingling feeling. ? Having trouble talking. ? Having weak muscles.  Some people have symptoms after a migraine headache (postdromal phase), such as: ? Tiredness. ? Trouble thinking (concentrating). How is this treated?  Taking medicines that: ? Relieve pain. ? Relieve the feeling of being sick to your stomach. ? Prevent migraine headaches.  Treatment may also include: ? Having acupuncture. ? Avoiding foods that bring on migraine headaches. ? Learning ways to control your body functions (biofeedback). ? Therapy to help you know and deal with negative thoughts (cognitive behavioral therapy). Follow these instructions at home: Medicines  Take over-the-counter and prescription medicines only as told by your  doctor.  Ask your doctor if the medicine prescribed to you: ? Requires you to avoid driving or using heavy machinery. ? Can cause trouble pooping (constipation). You may need to take these steps to prevent or treat trouble pooping:  Drink enough fluid to keep your pee (urine) pale yellow.  Take over-the-counter or prescription medicines.  Eat foods that are high in fiber. These include beans, whole grains, and fresh fruits and vegetables.  Limit foods that are high in fat and sugar. These include fried or sweet foods. Lifestyle  Do not drink alcohol.  Do not use any products that contain nicotine or tobacco, such as cigarettes, e-cigarettes, and chewing tobacco. If you need help quitting, ask your doctor.  Get at least 8 hours of sleep every night.  Limit and deal with stress. General instructions      Keep a journal to find out what may bring on your migraine headaches. For example, write down: ? What you eat and drink. ? How much sleep you get. ? Any change in what you eat or drink. ? Any change in your medicines.  If you have a migraine headache: ? Avoid things that make your symptoms worse, such as bright lights. ? It may help to lie down in a dark, quiet room. ? Do not drive or use heavy machinery. ? Ask your doctor what activities are safe for you.  Keep all follow-up visits as told by your doctor. This is important. Contact a doctor if:  You get a migraine headache that is different or worse than others you have had.  You have more than 15 headache days in one month. Get help right away if:  Your migraine headache gets very bad.  Your migraine headache lasts longer than 72 hours.  You have a fever.  You have a stiff neck.  You have trouble seeing.  Your muscles feel weak or like you cannot control them.  You start to lose your balance a lot.  You start to have trouble walking.  You pass out (faint).  You have a seizure. Summary  A migraine  headache is a very strong throbbing pain on one side or both sides of your head. These headaches can also cause other symptoms.  This condition may be treated with medicines and changes to your lifestyle.  Keep a journal to find out what may bring on your migraine headaches.  Contact a doctor if you get a migraine headache that is different or worse than others you have had.  Contact your doctor if you have more than 15 headache days in a month. This information is not intended to replace advice given to you by your health care provider. Make sure you discuss any questions you  have with your health care provider. Document Revised: 08/07/2018 Document Reviewed: 05/28/2018 Elsevier Patient Education  2020 [employer].

## 2020-01-27 ENCOUNTER — Telehealth: Payer: Self-pay | Admitting: *Deleted

## 2020-01-27 NOTE — Telephone Encounter (Signed)
I return patient phone call I was unable to reach pt.

## 2020-06-06 IMAGING — US US EXTREM LOW VENOUS*L*
1 series · 13 of 24 positions shown · non-contrast
Comparison: None.

CLINICAL DATA: Lower extremity pain and edema

EXAM:
LEFT LOWER EXTREMITY VENOUS DUPLEX ULTRASOUND
TECHNIQUE: Gray-scale sonography with graded compression, as well as color
Doppler and duplex ultrasound were performed to evaluate the left
lower extremity deep venous system from the level of the common
femoral vein and including the common femoral, femoral, profunda
femoral, popliteal and calf veins including the posterior tibial,
peroneal and gastrocnemius veins when visible. The superficial great
saphenous vein was also interrogated. Spectral Doppler was utilized
to evaluate flow at rest and with distal augmentation maneuvers in
the common femoral, femoral and popliteal veins.

[Series 1: us venous img lower uni left (dvt) · portal-venous · 13 of 27 slices shown]
[im 1/27]
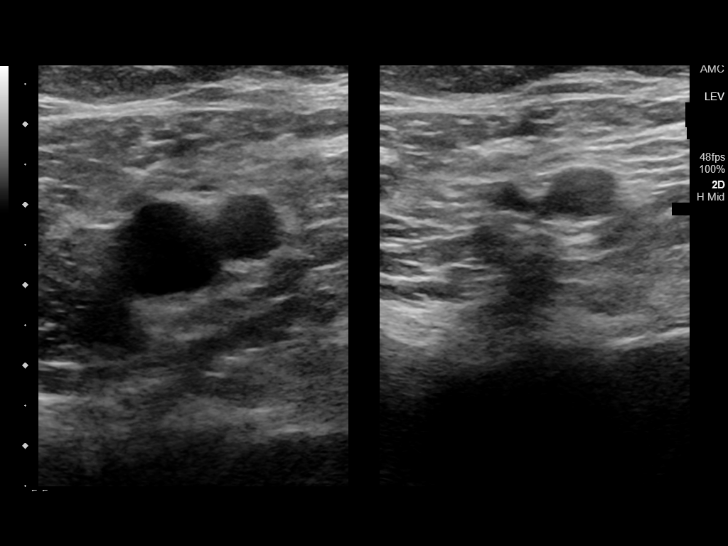
[im 3/27]
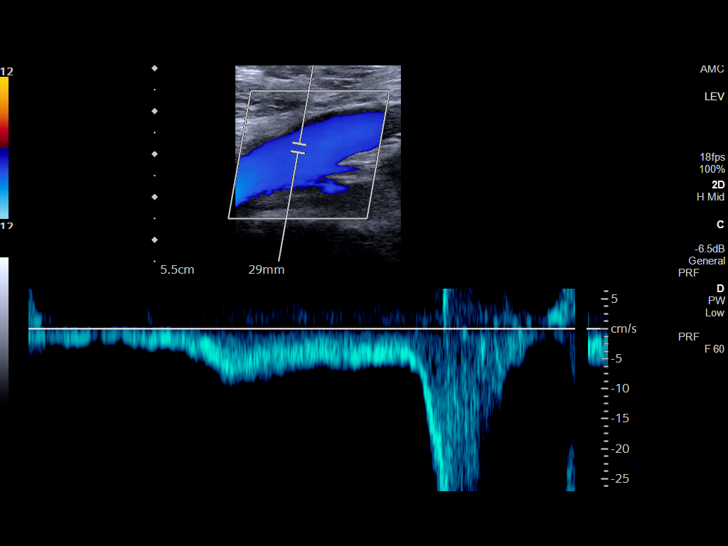
[im 5/27]
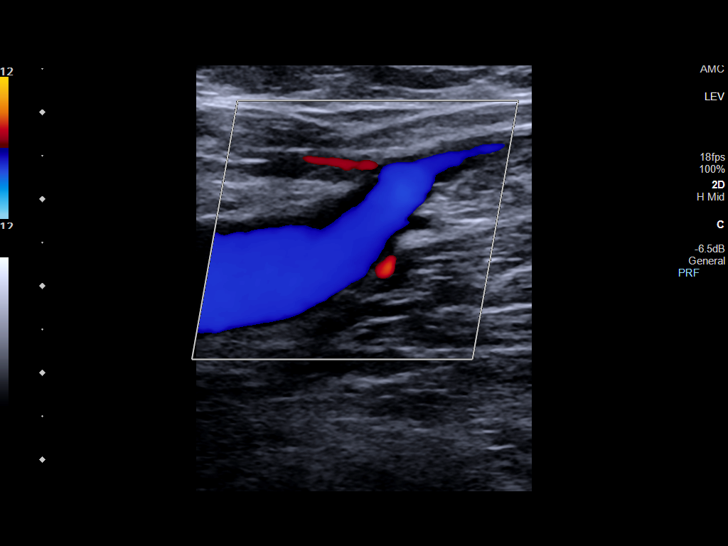
[im 7/27]
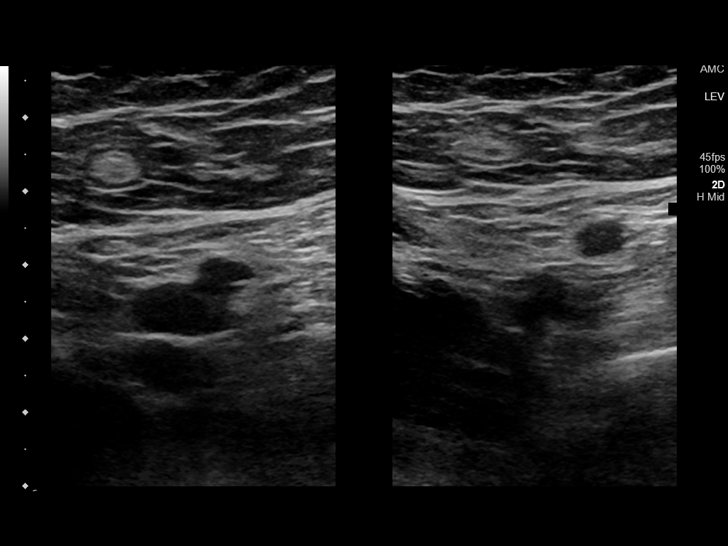
[im 10/27]
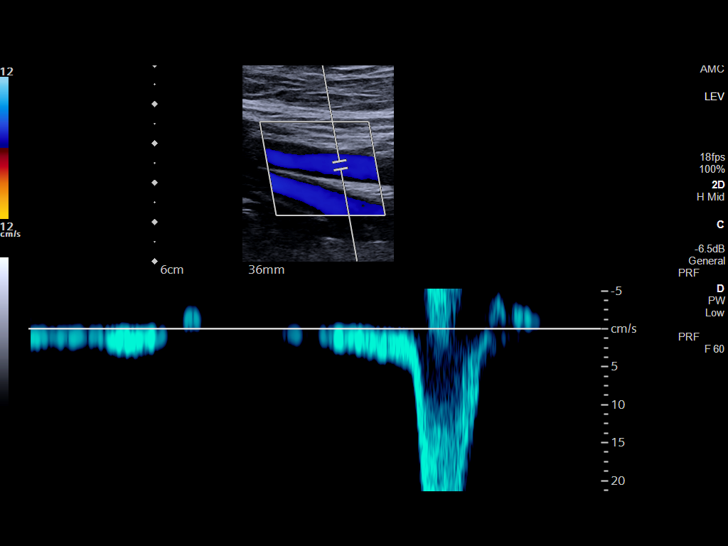
[im 12/27]
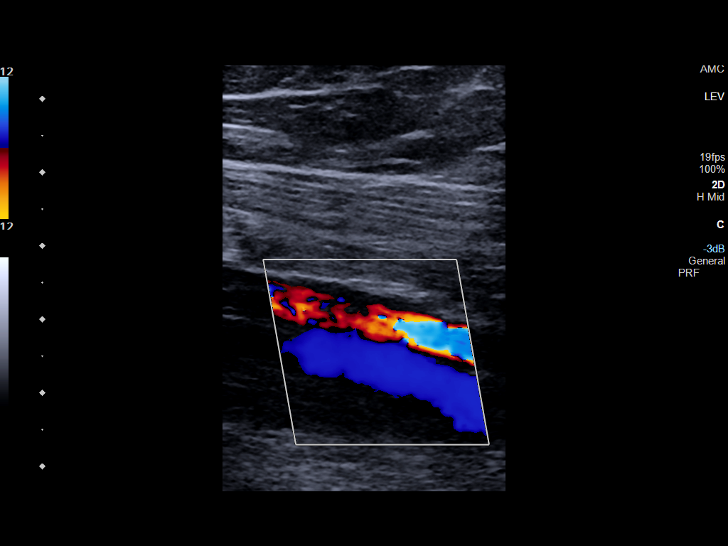
[im 14/27]
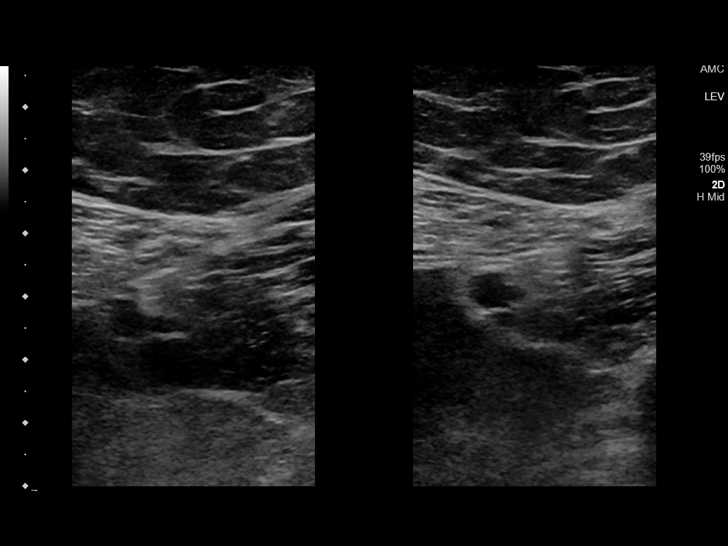
[im 15/27]
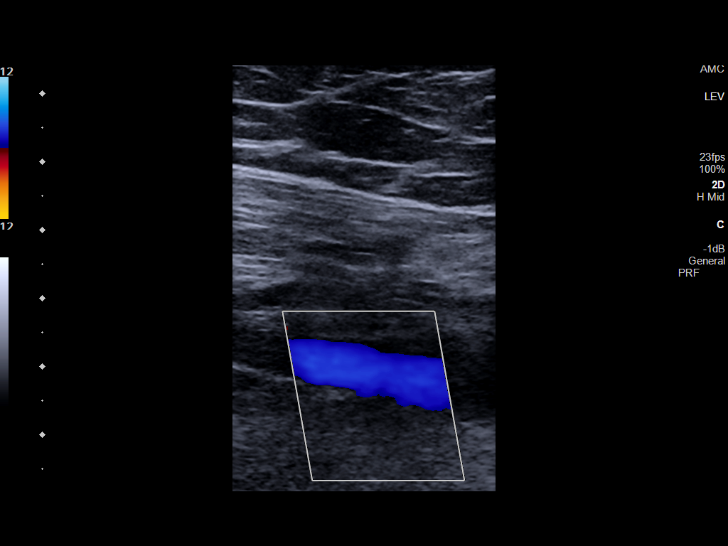
[im 17/27]
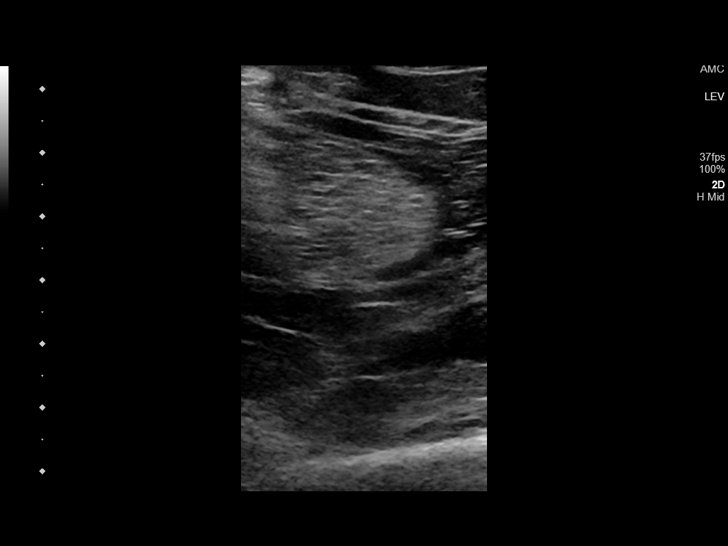
[im 20/27]
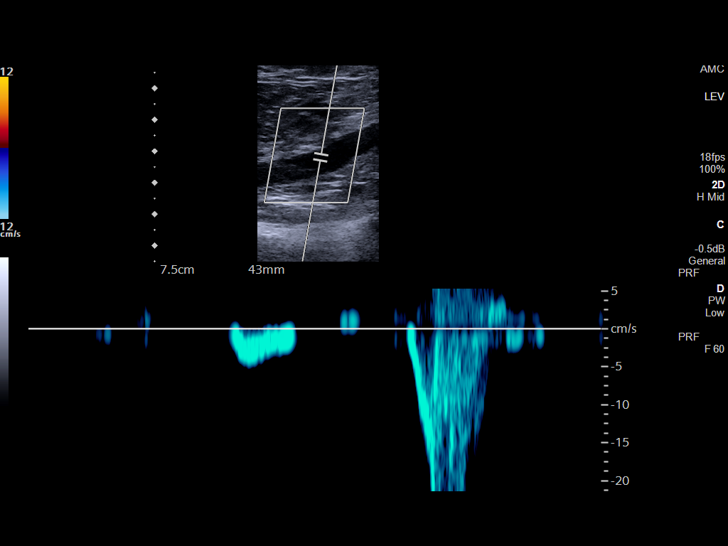
[im 22/27]
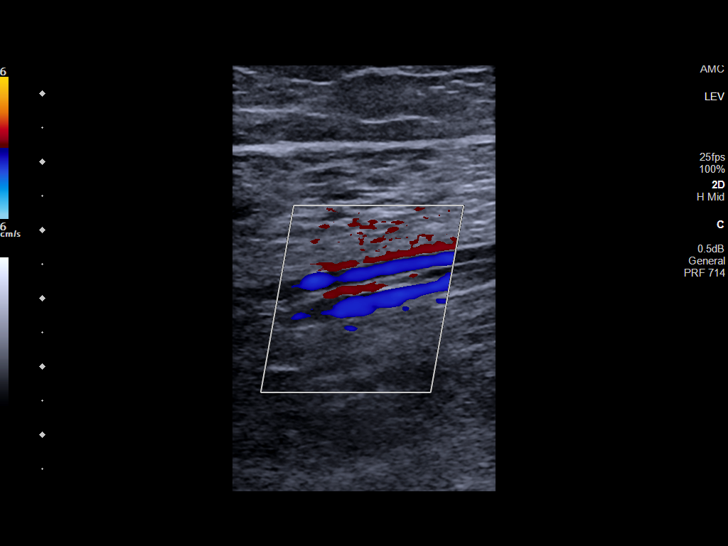
[im 24/27]
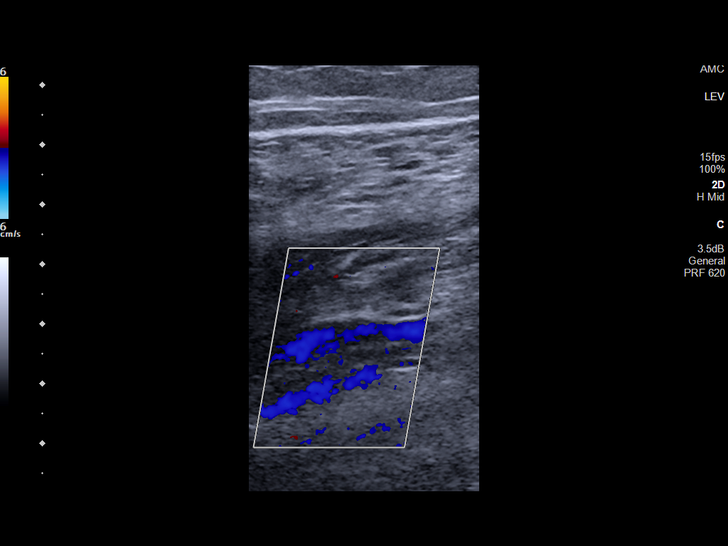
[im 27/27]
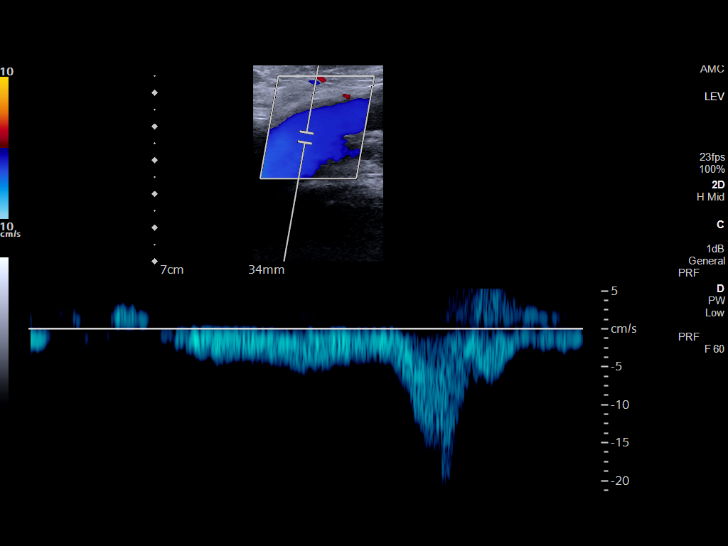

[13 of 24 positions shown; findings below may reference images not displayed]

FINDINGS: Contralateral Common Femoral Vein: Respiratory phasicity is normal
and symmetric with the symptomatic side. No evidence of thrombus.
Normal compressibility.

Common Femoral Vein: No evidence of thrombus. Normal
compressibility, respiratory phasicity and response to augmentation.

Saphenofemoral Junction: No evidence of thrombus. Normal
compressibility and flow on color Doppler imaging.

Profunda Femoral Vein: No evidence of thrombus. Normal
compressibility and flow on color Doppler imaging.

Femoral Vein: No evidence of thrombus. Normal compressibility,
respiratory phasicity and response to augmentation.

Popliteal Vein: No evidence of thrombus. Normal compressibility,
respiratory phasicity and response to augmentation.

Calf Veins: No evidence of thrombus. Normal compressibility and flow
on color Doppler imaging.

Superficial Great Saphenous Vein: No evidence of thrombus. Normal
compressibility.

Venous Reflux:  None.

Other Findings:  None.
IMPRESSION: No evidence of deep venous thrombosis in the left lower extremity.
Right common femoral vein also patent.

## 2020-06-26 ENCOUNTER — Encounter: Payer: Self-pay | Admitting: Family Medicine

## 2020-06-26 ENCOUNTER — Ambulatory Visit: Payer: 59 | Admitting: Family Medicine
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
## 2021-02-04 DIAGNOSIS — M792 Neuralgia and neuritis, unspecified: Secondary | ICD-10-CM | POA: Insufficient documentation

## 2021-09-03 ENCOUNTER — Other Ambulatory Visit: Payer: Self-pay

## 2021-09-03 ENCOUNTER — Emergency Department: Payer: BC Managed Care – PPO

## 2021-09-03 ENCOUNTER — Observation Stay
Admission: EM | Admit: 2021-09-03 | Discharge: 2021-09-05 | Disposition: A | Discharge status: Home / Self Care | Payer: BC Managed Care – PPO | Attending: Emergency Medicine | Admitting: Emergency Medicine

## 2021-09-03 DIAGNOSIS — K810 Acute cholecystitis: Secondary | ICD-10-CM | POA: Diagnosis present

## 2021-09-03 DIAGNOSIS — Z79899 Other long term (current) drug therapy: Secondary | ICD-10-CM | POA: Diagnosis not present

## 2021-09-03 DIAGNOSIS — R1013 Epigastric pain: Secondary | ICD-10-CM

## 2021-09-03 DIAGNOSIS — J45909 Unspecified asthma, uncomplicated: Secondary | ICD-10-CM | POA: Insufficient documentation

## 2021-09-03 DIAGNOSIS — N390 Urinary tract infection, site not specified: Secondary | ICD-10-CM | POA: Diagnosis not present

## 2021-09-03 DIAGNOSIS — R1031 Right lower quadrant pain: Secondary | ICD-10-CM | POA: Diagnosis present

## 2021-09-03 DIAGNOSIS — K8000 Calculus of gallbladder with acute cholecystitis without obstruction: Secondary | ICD-10-CM | POA: Diagnosis not present

## 2021-09-03 DIAGNOSIS — R112 Nausea with vomiting, unspecified: Secondary | ICD-10-CM

## 2021-09-03 DIAGNOSIS — Z87891 Personal history of nicotine dependence: Secondary | ICD-10-CM | POA: Diagnosis not present

## 2021-09-03 DIAGNOSIS — K219 Gastro-esophageal reflux disease without esophagitis: Secondary | ICD-10-CM | POA: Insufficient documentation

## 2021-09-03 DIAGNOSIS — K8020 Calculus of gallbladder without cholecystitis without obstruction: Secondary | ICD-10-CM

## 2021-09-03 HISTORY — DX: Acute cholecystitis: K81.0

## 2021-09-03 LAB — COMPREHENSIVE METABOLIC PANEL
ALT: 22 U/L (ref 0–44)
AST: 23 U/L (ref 15–41)
Albumin: 3.9 g/dL (ref 3.5–5.0)
Alkaline Phosphatase: 91 U/L (ref 38–126)
Anion gap: 11 (ref 5–15)
BUN: 12 mg/dL (ref 6–20)
CO2: 20 mmol/L — ABNORMAL LOW (ref 22–32)
Calcium: 9.5 mg/dL (ref 8.9–10.3)
Chloride: 107 mmol/L (ref 98–111)
Creatinine, Ser: 0.66 mg/dL (ref 0.44–1.00)
GFR, Estimated: >60 mL/min (ref >60)
Glucose, Bld: 141 mg/dL — ABNORMAL HIGH (ref 70–99)
Potassium: 3.5 mmol/L (ref 3.5–5.1)
Sodium: 138 mmol/L (ref 135–145)
Total Bilirubin: 0.5 mg/dL (ref 0.3–1.2)
Total Protein: 8.7 g/dL — ABNORMAL HIGH (ref 6.5–8.1)

## 2021-09-03 LAB — CBC
HCT: 37.5 % (ref 36.0–46.0)
Hemoglobin: 12.2 g/dL (ref 12.0–15.0)
MCH: 28.4 pg (ref 26.0–34.0)
MCHC: 32.5 g/dL (ref 30.0–36.0)
MCV: 87.4 fL (ref 80.0–100.0)
Platelets: 352 10*3/uL (ref 150–400)
RBC: 4.29 MIL/uL (ref 3.87–5.11)
RDW: 14.7 % (ref 11.5–15.5)
WBC: 12.1 10*3/uL — ABNORMAL HIGH (ref 4.0–10.5)
nRBC: 0 % (ref 0.0–0.2)

## 2021-09-03 LAB — URINALYSIS, ROUTINE W REFLEX MICROSCOPIC
Bilirubin Urine: NEGATIVE
Glucose, UA: NEGATIVE mg/dL
Ketones, ur: 20 mg/dL — AB
Nitrite: POSITIVE — AB
Protein, ur: 100 mg/dL — AB
RBC / HPF: >50 RBC/hpf — ABNORMAL HIGH (ref 0–5)
Specific Gravity, Urine: 1.021 (ref 1.005–1.030)
WBC, UA: >50 WBC/hpf — ABNORMAL HIGH (ref 0–5)
pH: 9 — ABNORMAL HIGH (ref 5.0–8.0)

## 2021-09-03 LAB — PREGNANCY, URINE: Preg Test, Ur: NEGATIVE

## 2021-09-03 LAB — LIPASE, BLOOD: Lipase: 25 U/L (ref 11–51)

## 2021-09-03 MED ORDER — ONDANSETRON 4 MG PO TBDP
4.0000 mg | ORAL_TABLET | Freq: Once | ORAL | Status: AC
  Administered 2021-09-03: 4 mg via ORAL
  Filled 2021-09-03: qty 1

## 2021-09-03 MED ORDER — HYDROCODONE-ACETAMINOPHEN 5-325 MG PO TABS
1.0000 | ORAL_TABLET | ORAL | Status: DC | PRN
  Administered 2021-09-03: 2 via ORAL
  Administered 2021-09-05: 1 via ORAL
  Filled 2021-09-03: qty 1
  Filled 2021-09-03: qty 2

## 2021-09-03 MED ORDER — VENLAFAXINE HCL ER 75 MG PO CP24
150.0000 mg | ORAL_CAPSULE | Freq: Every day | ORAL | Status: DC
  Administered 2021-09-05: 150 mg via ORAL
  Filled 2021-09-03: qty 1
  Filled 2021-09-03: qty 2

## 2021-09-03 MED ORDER — ENOXAPARIN SODIUM 40 MG/0.4ML IJ SOSY
40.0000 mg | PREFILLED_SYRINGE | INTRAMUSCULAR | Status: DC
  Administered 2021-09-04: 40 mg via SUBCUTANEOUS
  Filled 2021-09-03 (×2): qty 0.4

## 2021-09-03 MED ORDER — TOPIRAMATE 25 MG PO TABS: 50.0000 mg | ORAL_TABLET | Freq: Two times a day (BID) | ORAL | Status: DC

## 2021-09-03 MED ORDER — SUMATRIPTAN SUCCINATE 50 MG PO TABS
50.0000 mg | ORAL_TABLET | ORAL | Status: DC | PRN
  Filled 2021-09-03: qty 1

## 2021-09-03 MED ORDER — VENLAFAXINE HCL 37.5 MG PO TABS: 75.0000 mg | ORAL_TABLET | Freq: Every day | ORAL | Status: DC

## 2021-09-03 MED ORDER — ALBUTEROL SULFATE (2.5 MG/3ML) 0.083% IN NEBU: 3.0000 mL | INHALATION_SOLUTION | Freq: Four times a day (QID) | RESPIRATORY_TRACT | Status: DC | PRN

## 2021-09-03 MED ORDER — ALUM & MAG HYDROXIDE-SIMETH 200-200-20 MG/5ML PO SUSP
30.0000 mL | Freq: Once | ORAL | Status: AC
  Administered 2021-09-03: 30 mL via ORAL
  Filled 2021-09-03: qty 30

## 2021-09-03 MED ORDER — IOHEXOL 300 MG/ML  SOLN
100.0000 mL | Freq: Once | INTRAMUSCULAR | Status: AC | PRN
  Administered 2021-09-03: 100 mL via INTRAVENOUS
  Filled 2021-09-03: qty 100

## 2021-09-03 MED ORDER — ONDANSETRON 4 MG PO TBDP: 4.0000 mg | ORAL_TABLET | Freq: Once | ORAL | Status: DC

## 2021-09-03 MED ORDER — KETOROLAC TROMETHAMINE 15 MG/ML IJ SOLN
15.0000 mg | Freq: Once | INTRAMUSCULAR | Status: AC
  Administered 2021-09-03: 15 mg via INTRAVENOUS
  Filled 2021-09-03: qty 1

## 2021-09-03 MED ORDER — ONDANSETRON HCL 4 MG/2ML IJ SOLN
4.0000 mg | Freq: Once | INTRAMUSCULAR | Status: AC
Start: 2021-09-03 — End: 2021-09-03
  Administered 2021-09-03: 4 mg via INTRAVENOUS
  Filled 2021-09-03: qty 2

## 2021-09-03 MED ORDER — PANTOPRAZOLE SODIUM 40 MG IV SOLR
40.0000 mg | Freq: Every day | INTRAVENOUS | Status: DC
  Administered 2021-09-03 – 2021-09-04 (×2): 40 mg via INTRAVENOUS
  Filled 2021-09-03 (×2): qty 10

## 2021-09-03 MED ORDER — PIPERACILLIN-TAZOBACTAM 3.375 G IVPB
3.3750 g | Freq: Three times a day (TID) | INTRAVENOUS | Status: DC
  Administered 2021-09-03 – 2021-09-05 (×4): 3.375 g via INTRAVENOUS
  Filled 2021-09-03 (×4): qty 50

## 2021-09-03 MED ORDER — SODIUM CHLORIDE 0.9 % IV SOLN
1.0000 g | INTRAVENOUS | Status: DC
  Administered 2021-09-03: 1 g via INTRAVENOUS
  Filled 2021-09-03: qty 10

## 2021-09-03 MED ORDER — MORPHINE SULFATE (PF) 4 MG/ML IV SOLN
4.0000 mg | Freq: Once | INTRAVENOUS | Status: AC
  Administered 2021-09-03: 4 mg via INTRAVENOUS
  Filled 2021-09-03: qty 1

## 2021-09-03 MED ORDER — ONDANSETRON 4 MG PO TBDP
4.0000 mg | ORAL_TABLET | Freq: Four times a day (QID) | ORAL | Status: DC | PRN
Start: 2021-09-03 — End: 2021-09-05

## 2021-09-03 MED ORDER — ACETAMINOPHEN 650 MG RE SUPP: 650.0000 mg | Freq: Four times a day (QID) | RECTAL | Status: DC | PRN

## 2021-09-03 MED ORDER — SODIUM CHLORIDE 0.9 % IV SOLN: INTRAVENOUS | Status: DC

## 2021-09-03 MED ORDER — ONDANSETRON HCL 4 MG/2ML IJ SOLN
4.0000 mg | Freq: Four times a day (QID) | INTRAMUSCULAR | Status: DC | PRN
  Administered 2021-09-03 – 2021-09-04 (×2): 4 mg via INTRAVENOUS
  Filled 2021-09-03 (×2): qty 2

## 2021-09-03 MED ORDER — MORPHINE SULFATE (PF) 4 MG/ML IV SOLN
4.0000 mg | INTRAVENOUS | Status: DC | PRN
  Administered 2021-09-03 – 2021-09-04 (×4): 4 mg via INTRAVENOUS
  Filled 2021-09-03 (×4): qty 1

## 2021-09-03 MED ORDER — BUPROPION HCL ER (XL) 150 MG PO TB24
150.0000 mg | ORAL_TABLET | Freq: Every day | ORAL | Status: DC
  Administered 2021-09-05: 150 mg via ORAL
  Filled 2021-09-03: qty 1

## 2021-09-03 MED ORDER — ACETAMINOPHEN 325 MG PO TABS: 650.0000 mg | ORAL_TABLET | Freq: Four times a day (QID) | ORAL | Status: DC | PRN

## 2021-09-03 MED ORDER — METAXALONE 800 MG PO TABS
800.0000 mg | ORAL_TABLET | Freq: Three times a day (TID) | ORAL | Status: DC | PRN
  Filled 2021-09-03: qty 1

## 2021-09-03 NOTE — ED Notes (Addendum)
This RN to restroom in lobby to answer call bell. Pt observed laying floor because "stomach hurts so bad". Pt assisted self to toilet to use toilet. No assistance needed. Informed to come out to lobby when able to be triaged. NAD noted.  ?

## 2021-09-03 NOTE — ED Provider Notes (Signed)
? ?[facility] ?Provider Note ? ? ? Event Date/Time  ? First MD Initiated Contact with Patient 09/03/21 1618   ?  (approximate) ? ? ?History  ? ?Abdominal Pain ? ? ?HPI ? ?Erin [name] is a 31 y.o. female with a past medical history of myofascial pain syndrome who presents today for evaluation of nausea, vomiting, and abdominal pain.  Patient reports that her symptoms began this morning.  She reports that she has lost count of how many times she has vomited.  She reports that her emesis is yellow.  She denies any hematemesis.  She reports that her abdominal pain is in the epigastrium in RUQ.  She is unsure if her pain is worsened after eating.  She reports that she has not had anything to eat today.  She reports that last night she had pizza but did not have pain until this morning. She had one episode of RUQ pain on Sunday after eating a greasy meal, but this resolved. She has not had any flank pain or dysuria/hematuria.  She reports that she takes methotrexate daily, and always has strong smelling urine, and thinks that perhaps her urine smelled slightly stronger today.  She has not had any fevers or chills.  She reports that she smokes marijuana daily.  Patient denies any vaginal discharge, reports that she is currently menstruating.  She reports that she is sexually active with 1 partner, denies any concern for sexually transmitted diseases.  Denies pelvic pain. ? ?Patient Active Problem List  ? Diagnosis Date Noted  ? Acute cholecystitis 09/03/2021  ? Myofascial pain syndrome, cervical 05/26/2019  ? Cervico-occipital neuralgia 05/26/2019  ? Post-operative state 09/08/2015  ? Amniotic fluid leaking 08/12/2015  ? Nausea and vomiting in pregnancy 07/05/2015  ? Family history of Down syndrome 03/16/2015  ? Hyperemesis affecting pregnancy, antepartum 03/10/2015  ? Cough 03/10/2015  ? Viral respiratory illness 03/10/2015  ? ? ?  ? ? ?Physical Exam  ? ?Triage Vital Signs: ?ED Triage  Vitals  ?Enc Vitals Group  ?   BP 09/03/21 1503 (!) 143/104  ?   Pulse Rate 09/03/21 1503 82  ?   Resp 09/03/21 1503 18  ?   Temp 09/03/21 1503 98.1 ?F (36.7 ?C)  ?   Temp Source 09/03/21 1503 Oral  ?   SpO2 09/03/21 1503 96 %  ?   Weight 09/03/21 1614 231 lb 7.7 oz (105 kg)  ?   Height 09/03/21 1614 5\' 8"  (1.727 m)  ?   Head Circumference --   ?   Peak Flow --   ?   Pain Score --   ?   Pain Loc --   ?   Pain Edu? --   ?   Excl. in [place]? --   ? ? ?Most recent vital signs: ?Vitals:  ? 09/03/21 1817 09/03/21 2058  ?BP: 116/78 103/68  ?Pulse: 82 78  ?Resp: 20 18  ?Temp:    ?SpO2: 98% 98%  ? ? ?Physical Exam ?Vitals and nursing note reviewed.  ?Constitutional:   ?   General: Awake and alert. Rolling around on stretcher, moaning, appears uncomfortable ?   Appearance: Normal appearance. She is well-developed and overweight.  ?HENT:  ?   Head: Normocephalic and atraumatic.  ?   Mouth/Throat:  ?   Mouth: Mucous membranes are moist.  ?Eyes:  ?   General: PERRL. Normal EOMs     ?   Right eye: No discharge.     ?  Left eye: No discharge.  ?   Conjunctiva/sclera: Conjunctivae normal.  ?Cardiovascular:  ?   Rate and Rhythm: Normal rate and regular rhythm.  ?   Pulses: Normal pulses.  ?   Heart sounds: Normal heart sounds ?Pulmonary:  ?   Effort: Pulmonary effort is normal. No respiratory distress.  ?   Breath sounds: Normal breath sounds.  ?Abdominal:  ?   Abdomen is soft. There is abdominal tenderness in the epigastrium and RUQ.  No RLQ or LLQ tenderness. No rebound. No distention.  No CVA tenderness ?Musculoskeletal:     ?   General: No swelling. Normal range of motion.  ?   Cervical back: Normal range of motion and neck supple.  ?Lymphadenopathy:  ?   Cervical: No cervical adenopathy.  ?Skin: ?   General: Skin is warm and dry.  ?   Capillary Refill: Capillary refill takes less than 2 seconds.  ?   Findings: No rash.  ?Neurological:  ?   Mental Status: alert.  ?  ? ? ?ED Results / Procedures / Treatments  ? ?Labs ?(all labs  ordered are listed, but only abnormal results are displayed) ?Labs Reviewed  ?COMPREHENSIVE METABOLIC PANEL - Abnormal; Notable for the following components:  ?    Result Value  ? CO2 20 (*)   ? Glucose, Bld 141 (*)   ? Total Protein 8.7 (*)   ? All other components within normal limits  ?CBC - Abnormal; Notable for the following components:  ? WBC 12.1 (*)   ? All other components within normal limits  ?URINALYSIS, ROUTINE W REFLEX MICROSCOPIC - Abnormal; Notable for the following components:  ? Color, Urine YELLOW (*)   ? APPearance CLOUDY (*)   ? pH 9.0 (*)   ? Hgb urine dipstick MODERATE (*)   ? Ketones, ur 20 (*)   ? Protein, ur 100 (*)   ? Nitrite POSITIVE (*)   ? Leukocytes,Ua MODERATE (*)   ? RBC / HPF >50 (*)   ? WBC, UA >50 (*)   ? Bacteria, UA MANY (*)   ? All other components within normal limits  ?LIPASE, BLOOD  ?PREGNANCY, URINE  ?HIV ANTIBODY (ROUTINE TESTING W REFLEX)  ?POC URINE PREG, ED  ? ? ? ?EKG ? ? ? ? ?RADIOLOGY ?I reviewed imaging and agree with radiologist findings ? ? ? ?PROCEDURES: ? ?Critical Care performed:  ? ?Procedures ? ? ?MEDICATIONS ORDERED IN ED: ?Medications  ?SUMAtriptan (IMITREX) tablet 50 mg (has no administration in time range)  ?buPROPion (WELLBUTRIN XL) 24 hr tablet 150 mg (has no administration in time range)  ?metaxalone (SKELAXIN) tablet 800 mg (has no administration in time range)  ?albuterol (PROVENTIL) (2.5 MG/3ML) 0.083% nebulizer solution 3 mL (has no administration in time range)  ?piperacillin-tazobactam (ZOSYN) IVPB 3.375 g (has no administration in time range)  ?enoxaparin (LOVENOX) injection 40 mg (has no administration in time range)  ?0.9 %  sodium chloride infusion (has no administration in time range)  ?acetaminophen (TYLENOL) tablet 650 mg (has no administration in time range)  ?  Or  ?acetaminophen (TYLENOL) suppository 650 mg (has no administration in time range)  ?HYDROcodone-acetaminophen (NORCO/VICODIN) 5-325 MG per tablet 1-2 tablet (2 tablets Oral  Given 09/03/21 2058)  ?morphine (PF) 4 MG/ML injection 4 mg (has no administration in time range)  ?ondansetron (ZOFRAN-ODT) disintegrating tablet 4 mg (has no administration in time range)  ?  Or  ?ondansetron (ZOFRAN) injection 4 mg (has no administration in time range)  ?  pantoprazole (PROTONIX) injection 40 mg (has no administration in time range)  ?venlafaxine XR (EFFEXOR-XR) 24 hr capsule 150 mg (has no administration in time range)  ?ondansetron (ZOFRAN-ODT) disintegrating tablet 4 mg (4 mg Oral Given 09/03/21 1512)  ?iohexol (OMNIPAQUE) 300 MG/ML solution 100 mL (100 mLs Intravenous Contrast Given 09/03/21 1650)  ?morphine (PF) 4 MG/ML injection 4 mg (4 mg Intravenous Given 09/03/21 1742)  ?ondansetron (ZOFRAN) injection 4 mg (4 mg Intravenous Given 09/03/21 1821)  ?ketorolac (TORADOL) 15 MG/ML injection 15 mg (15 mg Intravenous Given 09/03/21 1901)  ?alum & mag hydroxide-simeth (MAALOX/MYLANTA) 200-200-20 MG/5ML suspension 30 mL (30 mLs Oral Given 09/03/21 1901)  ? ? ? ?IMPRESSION / MDM / ASSESSMENT AND PLAN / ED COURSE  ?I reviewed the triage vital signs and the nursing notes. ? ? ?Differential diagnosis includes, but is not limited to, cholecystitis, biliary colic, pancreatitis, gastritis, peptic ulcer disease, gastroenteritis, UTI, pyelonephritis, nephrolithiasis, cannabis hyperemesis. ? ?Patient presents emergency department uncomfortable appearing, moaning, though hemodynamically stable and afebrile.  Labs were obtained at triage and reveal a leukocytosis to 12.  Urinalysis is grossly bloody with greater than 50 WBCs and greater than 50 RBCs with bacteria, leukocytes, and nitrites.  She was given a gram of Rocephin for UTI/possible pyelo while awaiting further results, though no CVAT.  Pregnancy is negative, doubt ectopic.  She denies any concern for sexually transmitted disease, denies vaginal discharge or pelvic pain, doubt PID.  Patient was initially treated with morphine, which provided temporary relief, though  pain returned shortly thereafter.  CT scan was obtained for evaluation of concurrent intra-abdominal pathology.  CT reveals multiple findings including a small hiatal hernia with patulous distal esophagus s

## 2021-09-03 NOTE — ED Notes (Signed)
Pt unable to void at this time. 

## 2021-09-03 NOTE — H&P (Signed)
SURGICAL CONSULTATION NOTE  ? ?HISTORY OF PRESENT ILLNESS (HPI):  ?31 y.o. female presented to [facility] ED for evaluation of abdominal pain. Patient reports started having right upper quadrant pain since this morning.  Pain mainly on the right upper quadrant that radiates to the right upper back.  There has been no alleviating factors.  There is no aggravating factors identified. ? ?At the ED she was found with tenderness in the right upper quadrant.  CT scan of the abdomen and pelvis did not shows any significant pathology.  Abdominal ultrasound shows cholelithiasis without gallbladder wall thickening.  White blood cell count is 12,000.  Normal hemoglobin.  No significant electrolyte disturbance.  AST/ALT, alkaline phosphatase and bilirubin within normal limits.  I personally evaluated the images of the CT scan of the abdomen ultrasound. ? ?Patient has received multiple doses of pain medication without improvement of the pain.  Patient consented to palpation in the right upper quadrant. ? ?Surgery is consulted by Poggi, PA in this context for evaluation and management of acute cholecystitis. ? ?PAST MEDICAL HISTORY (PMH):  ?Past Medical History:  ?Diagnosis Date  ? ADD (attention deficit disorder)   ? ADHD  ? Anxiety   ? Asthma   ? Chronic back pain   ? Depression   ? 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 Fatigue   ? GERD (gastroesophageal reflux disease)   ? Numbness and tingling   ? Seasonal allergies   ? Seasonal allergies   ? Seizures ([place])   ? during childhood  ?  ? ?PAST SURGICAL HISTORY ([place]):  ?Past Surgical History:  ?Procedure Laterality Date  ? CESAREAN SECTION  2014  ? CESAREAN SECTION WITH BILATERAL TUBAL LIGATION Bilateral 09/08/2015  ? Procedure: CESAREAN SECTION WITH BILATERAL TUBAL LIGATION;  Surgeon: Boykin Nearing, MD;  Location: [facility] ORS;  Service: Obstetrics;  Laterality: Bilateral;  ? TONSILLECTOMY    ?  ? ?MEDICATIONS:  ?Prior to Admission medications   ?Medication Sig Start Date End Date  Taking? Authorizing Provider  ?albuterol (VENTOLIN HFA) 108 (90 Base) MCG/ACT inhaler Inhale 2 puffs into the lungs every 6 (six) hours as needed for wheezing or shortness of breath.    [provider]  ?buPROPion (WELLBUTRIN XL) 150 MG 24 hr tablet Take 150 mg by mouth daily.    [provider]  ?cyclobenzaprine (FLEXERIL) 5 MG tablet Take 1 tablet (5 mg total) by mouth 3 (three) times daily as needed. ?Patient not taking: Reported on 12/23/2019 08/11/19   Menshew, Dannielle Karvonen, PA-C  ?ketorolac (TORADOL) 10 MG tablet Take 1 tablet (10 mg total) by mouth every 8 (eight) hours. ?Patient not taking: Reported on 12/23/2019 08/11/19   Menshew, Dannielle Karvonen, PA-C  ?meloxicam (MOBIC) 15 MG tablet Take 15 mg by mouth daily.    [provider]  ?metaxalone (SKELAXIN) 800 MG tablet Take 800 mg by mouth 3 (three) times daily as needed for muscle spasms.    [provider]  ?metoCLOPramide (REGLAN) 5 MG tablet Take 1 tablet (5 mg total) by mouth every 8 (eight) hours as needed for up to 5 days for nausea or vomiting. 08/11/19 08/16/19  Menshew, Dannielle Karvonen, PA-C  ?rizatriptan (MAXALT-MLT) 10 MG disintegrating tablet Take 1 tablet (10 mg total) by mouth as needed for migraine. May repeat in 2 hours if needed 06/02/19   Lomax, Amy, NP  ?tiZANidine (ZANAFLEX) 4 MG capsule Take 4 mg by mouth 3 (three) times daily as needed for muscle spasms. ?Patient not  taking: Reported on 12/23/2019    [provider]  ?topiramate (TOPAMAX) 50 MG tablet Take 1 tablet (50 mg total) by mouth 2 (two) times daily. 06/02/19   Lomax, Amy, NP  ?venlafaxine (EFFEXOR) 37.5 MG tablet Take 75 mg by mouth daily.     [provider]  ?  ? ?ALLERGIES:  ?Allergies  ?Allergen Reactions  ? Sulfa Antibiotics Hives  ? Sulfa Drugs Cross Reactors Other (See Comments)  ?  Uncontrollable body shaking.  ?  ? ?SOCIAL HISTORY:  ?Social History  ? ?Socioeconomic History  ? Marital status: Single  ?  Spouse name: Not on  file  ? Number of children: 2  ? Years of education: Not on file  ? Highest education level: Some college, no degree  ?Occupational History  ? Not on file  ?Tobacco Use  ? Smoking status: Former  ?  Packs/day: 0.10  ?  Types: Cigarettes  ? Smokeless tobacco: Never  ?Vaping Use  ? Vaping Use: Never used  ?Substance and Sexual Activity  ? Alcohol use: Yes  ?  Comment: occasional   ? Drug use: Not Currently  ?  Types: Marijuana  ?  Comment: tussionex cough syrup x 1 week 03-16-15  ? Sexual activity: Yes  ?  Birth control/protection: None, Surgical  ?Other Topics Concern  ? Not on file  ?Social History Narrative  ? Lives at home with her children  ? Right handed  ? Caffeine: maybe 3 cups a week  ? ?Social Determinants of Health  ? ?Financial Resource Strain: Not on file  ?Food Insecurity: Not on file  ?Transportation Needs: Not on file  ?Physical Activity: Not on file  ?Stress: Not on file  ?Social Connections: Not on file  ?Intimate Partner Violence: Not on file  ?  ? ?FAMILY HISTORY:  ?Family History  ?Problem Relation Age of Onset  ? Hypertension Mother   ? Arthritis Mother   ? Cancer Mother   ? Diabetes Mother   ? Migraines Mother   ? Cancer Father   ? Diabetes Maternal Grandmother   ?  ? ?REVIEW OF SYSTEMS:  ?Constitutional: denies weight loss, fever, chills, or sweats  ?Eyes: denies any other vision changes, history of eye injury  ?ENT: denies sore throat, hearing problems  ?Respiratory: denies shortness of breath, wheezing  ?Cardiovascular: denies chest pain, palpitations  ?Gastrointestinal: positive abdominal pain, nausea and vomitnig ?Genitourinary: denies burning with urination or urinary frequency ?Musculoskeletal: denies any other joint pains or cramps  ?Skin: denies any other rashes or skin discolorations  ?Neurological: denies any other headache, dizziness, weakness  ?Psychiatric: denies any other depression, anxiety  ? ?All other review of systems were negative  ? ?VITAL SIGNS:  ?Temp:  [98.1 ?F (36.7  ?C)] 98.1 ?F (36.7 ?C) (05/08 1503) ?Pulse Rate:  [82] 82 (05/08 1817) ?Resp:  [18-20] 20 (05/08 1817) ?BP: (116-143)/(78-104) 116/78 (05/08 1817) ?SpO2:  [96 %-98 %] 98 % (05/08 1817) ?Weight:  [105 kg] 105 kg (05/08 1614)     Height: 5\' 8"  (172.7 cm) Weight: 105 kg BMI (Calculated): 35.21  ? ?INTAKE/OUTPUT:  ?This shift: No intake/output data recorded.  ?Last 2 shifts: @IOLAST2SHIFTS @  ? ?PHYSICAL EXAM:  ?Constitutional:  ?-- Normal body habitus  ?-- Awake, alert, and oriented x3  ?Eyes:  ?-- Pupils equally round and reactive to light  ?-- No scleral icterus  ?Ear, nose, and throat:  ?-- No jugular venous distension  ?Pulmonary:  ?-- No crackles  ?-- Equal breath sounds  bilaterally ?-- Breathing non-labored at rest ?Cardiovascular:  ?-- S1, S2 present  ?-- No pericardial rubs ?Gastrointestinal:  ?-- Abdomen soft, tender in the right upper quadrant, non-distended, no guarding or rebound tenderness ?-- No abdominal masses appreciated, pulsatile or otherwise  ?Musculoskeletal and Integumentary:  ?-- Wounds: None appreciated ?-- Extremities: B/L UE and LE FROM, hands and feet warm, no edema  ?Neurologic:  ?-- Motor function: intact and symmetric ?-- Sensation: intact and symmetric ? ? ?Labs:  ? ?  Latest Ref Rng & Units 09/03/2021  ?  3:04 PM 05/31/2019  ? 10:08 PM 01/26/2018  ?  3:45 PM  ?CBC  ?WBC 4.0 - 10.5 K/uL 12.1   11.4   8.7    ?Hemoglobin 12.0 - 15.0 g/dL 12.2   12.7   13.2    ?Hematocrit 36.0 - 46.0 % 37.5   39.1   37.7    ?Platelets 150 - 400 K/uL 352   340   320    ? ? ?  Latest Ref Rng & Units 09/03/2021  ?  3:04 PM 05/31/2019  ? 10:08 PM 01/26/2018  ?  3:45 PM  ?CMP  ?Glucose 70 - 99 mg/dL 141   96   108    ?BUN 6 - 20 mg/dL 12   11   10     ?Creatinine 0.44 - 1.00 mg/dL 0.66   0.81   0.82    ?Sodium 135 - 145 mmol/L 138   137   135    ?Potassium 3.5 - 5.1 mmol/L 3.5   3.4   3.8    ?Chloride 98 - 111 mmol/L 107   103   99    ?CO2 22 - 32 mmol/L 20   24   26     ?Calcium 8.9 - 10.3 mg/dL 9.5   8.3   8.9    ?Total  Protein 6.5 - 8.1 g/dL 8.7    7.7    ?Total Bilirubin 0.3 - 1.2 mg/dL 0.5    0.5    ?Alkaline Phos 38 - 126 U/L 91    62    ?AST 15 - 41 U/L 23    18    ?ALT 0 - 44 U/L 22    18    ? ? ?Imaging studies:  ?EXAM: ?

## 2021-09-03 NOTE — ED Triage Notes (Signed)
Pt comes into the ED via [place] from home with c/o N/V/D with abd pain since early this morning, a/ox4   ?164/96 ?[id number] ?[id number] ?99%RA ?CBG109 ? ?

## 2021-09-03 NOTE — ED Provider Triage Note (Signed)
Emergency Medicine Provider Triage Evaluation Note ? ?Erin [name] , a 31 y.o. female  was evaluated in triage.  Pt complains of nausea, vomiting, and diarrhea.  Patient presents to the ED via EMS from home with onset of symptoms early this morning.  Patient is currently on her menses.  She denies any known fevers, chills, sweats. ? ?Review of Systems  ?Positive: NVD ?Negative: FCS ? ?Physical Exam  ?BP (!) 143/104 (BP Location: Left Arm)   Pulse 82   Temp 98.1 ?F (36.7 ?C) (Oral)   Resp 18   SpO2 96%  ?Gen:   Awake, no distress  uncomfortable ?Resp:  Normal effort CTA ?MSK:   Moves extremities without difficulty  ?ABD:   Soft, nontender ? ?Medical Decision Making  ?Medically screening exam initiated at 3:12 PM.  Appropriate orders placed.  Erin [name] was informed that the remainder of the evaluation will be completed by another provider, this initial triage assessment does not replace that evaluation, and the importance of remaining in the ED until their evaluation is complete. ? ?To the ED with onset of nausea, vomiting, diarrhea this morning.  She presents from home for evaluation of her complaints. ?  ?Melvenia Needles, PA-C ?09/03/21 1514 ? ?

## 2021-09-04 ENCOUNTER — Encounter
Admission: EM | Disposition: A | Discharge status: Home / Self Care | Payer: Self-pay | Source: Home / Self Care | Attending: Emergency Medicine

## 2021-09-04 ENCOUNTER — Other Ambulatory Visit: Payer: Self-pay

## 2021-09-04 ENCOUNTER — Encounter: Payer: Self-pay | Admitting: General Surgery

## 2021-09-04 ENCOUNTER — Observation Stay: Payer: BC Managed Care – PPO | Admitting: Anesthesiology

## 2021-09-04 DIAGNOSIS — K8000 Calculus of gallbladder with acute cholecystitis without obstruction: Secondary | ICD-10-CM | POA: Diagnosis not present

## 2021-09-04 LAB — HIV ANTIBODY (ROUTINE TESTING W REFLEX): HIV Screen 4th Generation wRfx: NONREACTIVE

## 2021-09-04 SURGERY — CHOLECYSTECTOMY, ROBOT-ASSISTED, LAPAROSCOPIC
Anesthesia: General

## 2021-09-04 MED ORDER — ROCURONIUM BROMIDE 100 MG/10ML IV SOLN
INTRAVENOUS | Status: DC | PRN
  Administered 2021-09-04: 60 mg via INTRAVENOUS
  Administered 2021-09-04: 20 mg via INTRAVENOUS

## 2021-09-04 MED ORDER — MIDAZOLAM HCL 2 MG/2ML IJ SOLN
INTRAMUSCULAR | Status: AC
  Filled 2021-09-04: qty 2

## 2021-09-04 MED ORDER — BUPIVACAINE-EPINEPHRINE 0.25% -1:200000 IJ SOLN
INTRAMUSCULAR | Status: DC | PRN
  Administered 2021-09-04: 30 mL

## 2021-09-04 MED ORDER — DEXAMETHASONE SODIUM PHOSPHATE 10 MG/ML IJ SOLN
INTRAMUSCULAR | Status: AC
Start: 2021-09-04 — End: ?
  Filled 2021-09-04: qty 1

## 2021-09-04 MED ORDER — MIDAZOLAM HCL 2 MG/2ML IJ SOLN
INTRAMUSCULAR | Status: DC | PRN
  Administered 2021-09-04: 2 mg via INTRAVENOUS

## 2021-09-04 MED ORDER — INDOCYANINE GREEN 25 MG IV SOLR
1.2500 mg | Freq: Once | INTRAVENOUS | Status: AC
  Administered 2021-09-04: 1.25 mg via INTRAVENOUS
  Filled 2021-09-04: qty 0.5

## 2021-09-04 MED ORDER — SODIUM CHLORIDE 0.9 % IR SOLN
Status: DC | PRN
  Administered 2021-09-04: 500 mL

## 2021-09-04 MED ORDER — ONDANSETRON HCL 4 MG/2ML IJ SOLN
INTRAMUSCULAR | Status: DC | PRN
  Administered 2021-09-04: 4 mg via INTRAVENOUS

## 2021-09-04 MED ORDER — FENTANYL CITRATE (PF) 250 MCG/5ML IJ SOLN
INTRAMUSCULAR | Status: AC
  Filled 2021-09-04: qty 5

## 2021-09-04 MED ORDER — SUCCINYLCHOLINE CHLORIDE 200 MG/10ML IV SOSY
PREFILLED_SYRINGE | INTRAVENOUS | Status: AC
  Filled 2021-09-04: qty 10

## 2021-09-04 MED ORDER — LIDOCAINE HCL (PF) 2 % IJ SOLN
INTRAMUSCULAR | Status: AC
  Filled 2021-09-04: qty 5

## 2021-09-04 MED ORDER — SCOPOLAMINE 1 MG/3DAYS TD PT72: 1.0000 | MEDICATED_PATCH | TRANSDERMAL | Status: DC

## 2021-09-04 MED ORDER — BUPIVACAINE-EPINEPHRINE (PF) 0.25% -1:200000 IJ SOLN
INTRAMUSCULAR | Status: AC
  Filled 2021-09-04: qty 30

## 2021-09-04 MED ORDER — SUCCINYLCHOLINE CHLORIDE 200 MG/10ML IV SOSY
PREFILLED_SYRINGE | INTRAVENOUS | Status: DC | PRN
  Administered 2021-09-04: 140 mg via INTRAVENOUS

## 2021-09-04 MED ORDER — DEXMEDETOMIDINE HCL IN NACL 200 MCG/50ML IV SOLN
INTRAVENOUS | Status: DC | PRN
  Administered 2021-09-04: 12 ug via INTRAVENOUS
  Administered 2021-09-04: 8 ug via INTRAVENOUS
  Administered 2021-09-04: 12 ug via INTRAVENOUS
  Administered 2021-09-04: 8 ug via INTRAVENOUS

## 2021-09-04 MED ORDER — SCOPOLAMINE 1 MG/3DAYS TD PT72
MEDICATED_PATCH | TRANSDERMAL | Status: AC
  Administered 2021-09-04: 1.5 mg via TRANSDERMAL
  Filled 2021-09-04: qty 1

## 2021-09-04 MED ORDER — PHENYLEPHRINE HCL-NACL 20-0.9 MG/250ML-% IV SOLN
INTRAVENOUS | Status: AC
  Filled 2021-09-04: qty 250

## 2021-09-04 MED ORDER — KETAMINE HCL 10 MG/ML IJ SOLN
INTRAMUSCULAR | Status: DC | PRN
  Administered 2021-09-04: 30 mg via INTRAVENOUS

## 2021-09-04 MED ORDER — DEXAMETHASONE SODIUM PHOSPHATE 10 MG/ML IJ SOLN
INTRAMUSCULAR | Status: DC | PRN
  Administered 2021-09-04: 10 mg via INTRAVENOUS

## 2021-09-04 MED ORDER — STERILE WATER FOR IRRIGATION IR SOLN
Status: DC | PRN
  Administered 2021-09-04: 150 mL

## 2021-09-04 MED ORDER — ALUM & MAG HYDROXIDE-SIMETH 200-200-20 MG/5ML PO SUSP
30.0000 mL | Freq: Four times a day (QID) | ORAL | Status: DC | PRN
  Administered 2021-09-04: 30 mL via ORAL
  Filled 2021-09-04: qty 30

## 2021-09-04 MED ORDER — HYDROMORPHONE HCL 1 MG/ML IJ SOLN: 0.2500 mg | INTRAMUSCULAR | Status: DC | PRN

## 2021-09-04 MED ORDER — LIDOCAINE HCL (CARDIAC) PF 100 MG/5ML IV SOSY
PREFILLED_SYRINGE | INTRAVENOUS | Status: DC | PRN
  Administered 2021-09-04: 100 mg via INTRAVENOUS

## 2021-09-04 MED ORDER — KETOROLAC TROMETHAMINE 30 MG/ML IJ SOLN
INTRAMUSCULAR | Status: DC | PRN
  Administered 2021-09-04: 30 mg via INTRAVENOUS

## 2021-09-04 MED ORDER — HYDROMORPHONE HCL 1 MG/ML IJ SOLN
INTRAMUSCULAR | Status: DC | PRN
  Administered 2021-09-04 (×2): .5 mg via INTRAVENOUS

## 2021-09-04 MED ORDER — PROMETHAZINE HCL 25 MG/ML IJ SOLN: 6.2500 mg | INTRAMUSCULAR | Status: DC | PRN

## 2021-09-04 MED ORDER — PROPOFOL 10 MG/ML IV BOLUS
INTRAVENOUS | Status: AC
  Filled 2021-09-04: qty 20

## 2021-09-04 MED ORDER — ONDANSETRON HCL 4 MG/2ML IJ SOLN
INTRAMUSCULAR | Status: AC
  Filled 2021-09-04: qty 2

## 2021-09-04 MED ORDER — FENTANYL CITRATE (PF) 100 MCG/2ML IJ SOLN
INTRAMUSCULAR | Status: DC | PRN
  Administered 2021-09-04: 150 ug via INTRAVENOUS
  Administered 2021-09-04: 100 ug via INTRAVENOUS

## 2021-09-04 MED ORDER — KETAMINE HCL 50 MG/5ML IJ SOSY
PREFILLED_SYRINGE | INTRAMUSCULAR | Status: AC
  Filled 2021-09-04: qty 5

## 2021-09-04 MED ORDER — SUGAMMADEX SODIUM 500 MG/5ML IV SOLN
INTRAVENOUS | Status: DC | PRN
  Administered 2021-09-04: 250 mg via INTRAVENOUS

## 2021-09-04 MED ORDER — PIPERACILLIN-TAZOBACTAM 3.375 G IVPB
INTRAVENOUS | Status: AC
Start: 2021-09-04 — End: 2021-09-05
  Administered 2021-09-04: 3.375 g via INTRAVENOUS
  Filled 2021-09-04: qty 50

## 2021-09-04 MED ORDER — HYDROMORPHONE HCL 1 MG/ML IJ SOLN
INTRAMUSCULAR | Status: AC
  Filled 2021-09-04: qty 1

## 2021-09-04 MED ORDER — LACTATED RINGERS IV SOLN: INTRAVENOUS | Status: DC | PRN

## 2021-09-04 MED ORDER — SUGAMMADEX SODIUM 500 MG/5ML IV SOLN
INTRAVENOUS | Status: AC
  Filled 2021-09-04: qty 5

## 2021-09-04 MED ORDER — KETOROLAC TROMETHAMINE 30 MG/ML IJ SOLN
INTRAMUSCULAR | Status: AC
  Filled 2021-09-04: qty 1

## 2021-09-04 MED ORDER — PROPOFOL 10 MG/ML IV BOLUS
INTRAVENOUS | Status: DC | PRN
  Administered 2021-09-04: 200 mg via INTRAVENOUS

## 2021-09-04 SURGICAL SUPPLY — 50 items
BAG PRESSURE INF DISP 1000 (BAG) ×1 IMPLANT
BLADE SURG SZ11 CARB STEEL (BLADE) ×2 IMPLANT
CANNULA REDUC XI 12-8 STAPL (CANNULA) ×2
CANNULA REDUCER 12-8 DVNC XI (CANNULA) ×1 IMPLANT
CLIP LIGATING HEM O LOK PURPLE (MISCELLANEOUS) ×1 IMPLANT
CLIP LIGATING HEMO O LOK GREEN (MISCELLANEOUS) ×2 IMPLANT
DERMABOND ADVANCED (GAUZE/BANDAGES/DRESSINGS) ×1
DERMABOND ADVANCED .7 DNX12 (GAUZE/BANDAGES/DRESSINGS) ×1 IMPLANT
DRAPE ARM DVNC X/XI (DISPOSABLE) ×4 IMPLANT
DRAPE COLUMN DVNC XI (DISPOSABLE) ×1 IMPLANT
DRAPE DA VINCI XI ARM (DISPOSABLE) ×8
DRAPE DA VINCI XI COLUMN (DISPOSABLE) ×2
ELECT REM PT RETURN 9FT ADLT (ELECTROSURGICAL) ×2
ELECTRODE REM PT RTRN 9FT ADLT (ELECTROSURGICAL) ×1 IMPLANT
GLOVE BIO SURGEON STRL SZ 6.5 (GLOVE) ×4 IMPLANT
GLOVE BIOGEL PI IND STRL 6.5 (GLOVE) ×2 IMPLANT
GLOVE BIOGEL PI INDICATOR 6.5 (GLOVE) ×2
GOWN STRL REUS W/ TWL LRG LVL3 (GOWN DISPOSABLE) ×3 IMPLANT
GOWN STRL REUS W/TWL LRG LVL3 (GOWN DISPOSABLE) ×6
GRASPER SUT TROCAR 14GX15 (MISCELLANEOUS) ×2 IMPLANT
IRRIGATOR SUCT 8 DISP DVNC XI (IRRIGATION / IRRIGATOR) IMPLANT
IRRIGATOR SUCTION 8MM XI DISP (IRRIGATION / IRRIGATOR) ×2
IV NS 1000ML (IV SOLUTION) ×2
IV NS 1000ML BAXH (IV SOLUTION) IMPLANT
KIT PINK PAD W/HEAD ARE REST (MISCELLANEOUS) ×2 IMPLANT
KIT PINK PAD W/HEAD ARM REST (MISCELLANEOUS) ×1 IMPLANT
LABEL OR SOLS (LABEL) ×2 IMPLANT
MANIFOLD NEPTUNE II (INSTRUMENTS) ×2 IMPLANT
NDL INSUFFLATION 14GA 120MM (NEEDLE) ×1 IMPLANT
NEEDLE HYPO 22GX1.5 SAFETY (NEEDLE) ×2 IMPLANT
NEEDLE INSUFFLATION 14GA 120MM (NEEDLE) ×2 IMPLANT
OBTURATOR OPTICAL STANDARD 8MM (TROCAR) ×2
OBTURATOR OPTICAL STND 8 DVNC (TROCAR) ×1
OBTURATOR OPTICALSTD 8 DVNC (TROCAR) ×1 IMPLANT
PACK LAP CHOLECYSTECTOMY (MISCELLANEOUS) ×2 IMPLANT
SEAL CANN UNIV 5-8 DVNC XI (MISCELLANEOUS) ×3 IMPLANT
SEAL XI 5MM-8MM UNIVERSAL (MISCELLANEOUS) ×6
SET TUBE SMOKE EVAC HIGH FLOW (TUBING) ×2 IMPLANT
SOLUTION ELECTROLUBE (MISCELLANEOUS) ×2 IMPLANT
SPIKE FLUID TRANSFER (MISCELLANEOUS) ×3 IMPLANT
SPONGE T-LAP 4X18 ~~LOC~~+RFID (SPONGE) ×1 IMPLANT
STAPLER CANNULA SEAL DVNC XI (STAPLE) ×1 IMPLANT
STAPLER CANNULA SEAL XI (STAPLE) ×2
SUT MNCRL 4-0 (SUTURE) ×1
SUT MNCRL 4-0 27XMFL (SUTURE) ×1
SUT VICRYL 0 AB UR-6 (SUTURE) ×2 IMPLANT
SUTURE MNCRL 4-0 27XMF (SUTURE) ×1 IMPLANT
SYS BAG RETRIEVAL 10MM (BASKET) ×2
SYSTEM BAG RETRIEVAL 10MM (BASKET) ×1 IMPLANT
WATER STERILE IRR 500ML POUR (IV SOLUTION) ×2 IMPLANT

## 2021-09-04 NOTE — ED Notes (Signed)
Patient ambulatory to bathroom and back to bed. Frequent belching. Patient requested RN rub her back to "Help with indigestion." PRN Maalox ordered from General Surgery Standing orders. ?

## 2021-09-04 NOTE — Transfer of Care (Signed)
Immediate Anesthesia Transfer of Care Note ? ?Patient: Erin [name] ? ?Procedure(s) Performed: XI ROBOTIC ASSISTED LAPAROSCOPIC CHOLECYSTECTOMY ?INDOCYANINE [place] FLUORESCENCE IMAGING (ICG) ? ?Patient Location: PACU ? ?Anesthesia Type:General ? ?Level of Consciousness: drowsy ? ?Airway & Oxygen Therapy: Patient Spontanous Breathing and Patient connected to face mask oxygen ? ?Post-op Assessment: Report given to RN, Post -op Vital signs reviewed and stable and Patient moving all extremities ? ?Post vital signs: Reviewed and stable ? ?Last Vitals:  ?Vitals Value Taken Time  ?BP    ?Temp    ?Pulse    ?Resp    ?SpO2    ? ? ?Last Pain:  ?Vitals:  ? 09/04/21 1407  ?TempSrc: Oral  ?PainSc: 8   ?   ? ?  ? ?Complications: No notable events documented. ?

## 2021-09-04 NOTE — Progress Notes (Signed)
Admission profile updated. ?

## 2021-09-04 NOTE — Anesthesia Preprocedure Evaluation (Addendum)
Anesthesia Evaluation  ?Patient identified by MRN, date of birth, ID band ?Patient awake ? ? ? ?Reviewed: ?Allergy & Precautions, NPO status , Patient's Chart, lab work & pertinent test results ? ?Airway ?Mallampati: III ? ?TM Distance: >3 FB ?Neck ROM: full ? ? ? Dental ?no notable dental hx. ? ?  ?Pulmonary ?asthma , former smoker,  ?  ?Pulmonary exam normal ? ? ? ? ? ? ? Cardiovascular ?negative cardio ROS ?Normal cardiovascular exam ? ? ?  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  ? GI/Hepatic ?hiatal hernia, GERD  Medicated and Poorly Controlled,(+)  ?  ? substance abuse ? marijuana use, Acute Cholecystitis ?Small Hiatal hernia on CT scan ?  ?Endo/Other  ?negative endocrine ROS ? Renal/GU ?negative Renal ROS  ?negative genitourinary ?  ?Musculoskeletal ? ? Abdominal ?(+) + obese,   ?Peds ? Hematology ?negative hematology ROS ?(+)   ?Anesthesia Other Findings ?Past Medical History: ?No date: ADD (attention deficit disorder) ?    Comment:  ADHD ?No date: Anxiety ?No date: Asthma ?No date: Chronic back pain ?No date: Depression ?    Comment:  postpartum depression in past ?No date: Fatigue ?No date: GERD (gastroesophageal reflux disease) ?No date: Numbness and tingling ?No date: Seasonal allergies ?No date: Seasonal allergies ?No date: Seizures ([facility]) ?    Comment:  during childhood ? ?Past Surgical History: ?2014: CESAREAN SECTION ?09/08/2015: CESAREAN SECTION WITH BILATERAL TUBAL LIGATION; Bilateral ?    Comment:  Procedure: CESAREAN SECTION WITH BILATERAL TUBAL  ?             LIGATION;  Surgeon: Suzy Bouchard, MD;  Location: ?             [facility] ORS;  Service: Obstetrics;  Laterality: Bilateral; ?No date: TONSILLECTOMY ? ?BMI   ? Body Mass Index: 35.20 kg/m?  ?  ? ? Reproductive/Obstetrics ?negative OB ROS ? ?  ? ? ? ? ? ? ? ? ? ? ? ? ? ?  ?   ? ? ? ? ? ? ?Anesthesia Physical ?Anesthesia Plan ? ?ASA: 2 ? ?Anesthesia Plan: General  ? ?Post-op Pain Management: Ofirmev IV (intra-op)* and Toradol IV (intra-op)*  ? ?Induction: Intravenous and Rapid sequence ? ?PONV Risk Score and Plan: 3 and TIVA, Ondansetron, Dexamethasone, Scopolamine patch - Pre-op and Midazolam ? ?Airway Management Planned: Oral ETT ? ?Additional Equipment:  ? ?Intra-op Plan:  ? ?Post-operative Plan: Extubation in OR ? ?Informed Consent: I have reviewed the patients History and Physical, chart, labs and discussed the procedure including the risks, benefits and alternatives for the proposed anesthesia with the patient or authorized representative who has indicated his/her understanding and acceptance.  ? ? ? ?Dental advisory given ? ?Plan Discussed with: CRNA and Anesthesiologist ? ?Anesthesia Plan Comments:   ? ? ? ? ? ?Anesthesia Quick Evaluation ? ?

## 2021-09-04 NOTE — Op Note (Signed)
Preoperative diagnosis: Acute cholecystitis ? ?Postoperative diagnosis: Same ? ?Procedure: Robotic Assisted Laparoscopic Cholecystectomy.  ? ?Anesthesia: GETA ?  ?Surgeon: Dr. Hazle Quant ? ?Wound Classification: Clean Contaminated ? ?Indications: Patient is a 31 y.o. female developed right upper quadrant pain, leukocytosis and on workup was found to have cholelithiasis with a normal common duct and distended gallbladder consistent with cholecystitis. Robotic Assisted Laparoscopic cholecystectomy was elected. ? ?Findings: ?Abundant pericholecystic fluid ?Severely dilated gallbladder ?Critical view of safety achieved ?Cystic duct and artery identified, ligated and divided ?Adequate hemostasis ? ? ? ? ? ? ?Description of procedure: The patient was placed on the operating table in the supine position. General anesthesia was induced. A time-out was completed verifying correct patient, procedure, site, positioning, and implant(s) and/or special equipment prior to beginning this procedure. An orogastric tube was placed. The abdomen was prepped and draped in the usual sterile fashion.  ?An incision was made in a natural skin line below the umbilicus.  ?The fascia was elevated and the Veress needle inserted. Proper position was confirmed by aspiration and saline meniscus test.  ?The abdomen was insufflated with carbon dioxide to a pressure of 15 mmHg. The patient tolerated insufflation well. A 8-mm trocar was then inserted in optiview fashion.  ?The laparoscope was inserted and the abdomen inspected. No injuries from initial trocar placement were noted. Additional trocars were then inserted in the following locations: an 8-mm trocar in the left lateral abdomen, and another two 8-mm trocars to the right side of the abdomen 5 cm appart. The umbilical trocar was changed to a 12 mm trocar all under direct visualization. The abdomen was inspected and no abnormalities were found. The table was placed in the reverse Trendelenburg  position with the right side up. The robotic arms were docked and target anatomy identified. Instrument inserted under direct visualization.  ?Adhesions between the gallbladder and omentum, duodenum and transverse colon were lysed with electrocautery. The dome of the gallbladder was grasped with a prograsp and retracted over the dome of the liver. The infundibulum was also grasped with an atraumatic grasper and retracted toward the right lower quadrant. This maneuver exposed Calot?s triangle. The peritoneum overlying the gallbladder infundibulum was then incised and the cystic duct and cystic artery identified and circumferentially dissected. Critical view of safety reviewed before ligating any structure. Firefly images taken to visualize biliary ducts. The cystic duct and cystic artery were then doubly clipped and divided close to the gallbladder.  ?The gallbladder was then dissected from its peritoneal attachments by electrocautery. Hemostasis was checked and the gallbladder and contained stones were removed using an endoscopic retrieval bag. The gallbladder was passed off the table as a specimen. The gallbladder fossa was copiously irrigated with saline and hemostasis was obtained. There was no evidence of bleeding from the gallbladder fossa or cystic artery or leakage of the bile from the cystic duct stump. Secondary trocars were removed under direct vision. No bleeding was noted. The robotic arms were undoked. The scope was withdrawn and the umbilical trocar removed. The abdomen was allowed to collapse. The fascia of the 86mm trocar sites was closed with figure-of-eight 0 vicryl sutures. The skin was closed with subcuticular sutures of 4-0 monocryl and topical skin adhesive. The orogastric tube was removed.  ?The patient tolerated the procedure well and was taken to the postanesthesia care unit in stable condition.  ? ?Specimen: Gallbladder ? ?Complications: None ? ?EBL: 25 mL ? ?

## 2021-09-04 NOTE — Interval H&P Note (Signed)
History and Physical Interval Note: ? ?09/04/2021 ?3:07 PM ? ?Erin [name]  has presented today for surgery, with the diagnosis of Acute Cholecystitis.  The various methods of treatment have been discussed with the patient and family. After consideration of risks, benefits and other options for treatment, the patient has consented to  Procedure(s): ?XI ROBOTIC ASSISTED LAPAROSCOPIC CHOLECYSTECTOMY (N/A) ?INDOCYANINE [place]EN FLUORESCENCE IMAGING (ICG) (N/A) as a surgical intervention.  The patient's history has been reviewed, patient examined, no change in status, stable for surgery.  I have reviewed the patient's chart and labs.  Questions were answered to the patient's satisfaction.   ? ? ?Erin [name] ? ? ?

## 2021-09-04 NOTE — Anesthesia Procedure Notes (Signed)
Procedure Name: Intubation ?Date/Time: 09/04/2021 3:13 PM ?Performed by: Esaw Grandchild, CRNA ?Pre-anesthesia Checklist: Patient identified, Emergency Drugs available, Suction available and Patient being monitored ?Patient Re-evaluated:Patient Re-evaluated prior to induction ?Oxygen Delivery Method: Circle system utilized ?Preoxygenation: Pre-oxygenation with 100% oxygen ?Induction Type: IV induction, Rapid sequence and Cricoid Pressure applied ?Ventilation: Mask ventilation without difficulty ?Laryngoscope Size: Sabra Heck and 2 ?Grade View: Grade I ?Tube type: Oral ?Tube size: 7.5 mm ?Number of attempts: 1 ?Airway Equipment and Method: Stylet, Oral airway, LTA kit utilized and Bite block ?Placement Confirmation: ETT inserted through vocal cords under direct vision, positive ETCO2 and breath sounds checked- equal and bilateral ?Secured at: 22 cm ?Tube secured with: Tape ?Dental Injury: Teeth and Oropharynx as per pre-operative assessment  ? ? ? ? ?

## 2021-09-05 DIAGNOSIS — K8000 Calculus of gallbladder with acute cholecystitis without obstruction: Secondary | ICD-10-CM | POA: Diagnosis not present

## 2021-09-05 MED ORDER — OXYCODONE-ACETAMINOPHEN 5-325 MG PO TABS: 1.0000 | ORAL_TABLET | ORAL | 0 refills | Status: DC | PRN

## 2021-09-05 MED ORDER — PREGABALIN 75 MG PO CAPS
200.0000 mg | ORAL_CAPSULE | Freq: Two times a day (BID) | ORAL | Status: DC
  Administered 2021-09-05: 200 mg via ORAL
  Filled 2021-09-05: qty 1

## 2021-09-05 NOTE — Discharge Summary (Signed)
?  Patient ID: ?Erin [name] ?MRN: [id number] ?DOB/AGE: [birth year] 31 y.o. ? ?Admit date: 09/03/2021 ?Discharge date: 09/05/2021 ? ? ?Discharge Diagnoses:  ?Principal Problem: ?  Acute cholecystitis ? ? ?Procedures: Robotic assisted laparoscopic cholecystectomy ? ?Hospital Course: Patient with acute cholecystitis.  She was admitted for surgical management.  She underwent robotic assisted upper scopic cholecystectomy.  She tolerated the procedure well.  She has been recovering adequately.  Pain controlled.  Patient ambulating.  Patient tolerated diet.  Incisions dry and clean. ? ?Physical Exam ?Constitutional:   ?   Appearance: She is well-developed.  ?HENT:  ?   Head: Normocephalic.  ?Cardiovascular:  ?   Rate and Rhythm: Normal rate and regular rhythm.  ?Pulmonary:  ?   Effort: Pulmonary effort is normal.  ?   Breath sounds: Normal breath sounds.  ?Abdominal:  ?   General: Abdomen is flat. Bowel sounds are normal.  ?   Palpations: Abdomen is soft.  ?Skin: ?   General: Skin is warm.  ?Neurological:  ?   Mental Status: She is alert and oriented to person, place, and time.  ? ? ? ?Consults: None ? ?Disposition: Discharge disposition: 01-Home or Self Care ? ? ? ? ? ? ?Discharge Instructions   ? ? Diet - low sodium heart healthy   Complete by: As directed ?  ? Increase activity slowly   Complete by: As directed ?  ? ?  ? ?Allergies as of 09/05/2021   ? ?   Reactions  ? Sulfa Antibiotics Hives  ? Sulfa Drugs Cross Reactors Other (See Comments)  ? Uncontrollable body shaking.  ? ?  ? ?  ?Medication List  ?  ? ?TAKE these medications   ? ?albuterol 108 (90 Base) MCG/ACT inhaler ?Commonly known as: VENTOLIN HFA ?Inhale 2 puffs into the lungs every 6 (six) hours as needed for wheezing or shortness of breath. ?  ?buPROPion 150 MG 24 hr tablet ?Commonly known as: WELLBUTRIN XL ?Take 150 mg by mouth daily. ?  ?folic acid 1 MG tablet ?Commonly known as: FOLVITE ?Take 1 mg by mouth daily. ?  ?methotrexate 2.5 MG tablet ?Take  15 mg by mouth once a week. ?  ?omeprazole 20 MG capsule ?Commonly known as: PRILOSEC ?Take 1 tablet by mouth daily. ?  ?oxyCODONE-acetaminophen 5-325 MG tablet ?Commonly known as: Percocet ?Take 1 tablet by mouth every 4 (four) hours as needed for severe pain. ?  ?pregabalin 200 MG capsule ?Commonly known as: LYRICA ?Take 200 mg by mouth 2 (two) times daily. ?  ?rizatriptan 10 MG disintegrating tablet ?Commonly known as: MAXALT-MLT ?Take 1 tablet (10 mg total) by mouth as needed for migraine. May repeat in 2 hours if needed ?  ?tiZANidine 4 MG tablet ?Commonly known as: ZANAFLEX ?Take 4 mg by mouth 3 (three) times daily. ?  ?venlafaxine XR 150 MG 24 hr capsule ?Commonly known as: EFFEXOR-XR ?Take 150 mg by mouth daily. ?  ? ?  ? ? Follow-up Information   ? ? Carolan Shiver, MD Follow up in 2 week(s).   ?Specialty: General Surgery ?Contact information: ?[address] ?[place] [address] ?[phone] ? ? ?  ?  ? ?  ?  ? ?  ? ? ?

## 2021-09-05 NOTE — Plan of Care (Signed)

## 2021-09-05 NOTE — Clinical Social Work Note (Signed)
?  Transition of Care (TOC) Screening Note ? ? ?Patient Details  ?Name: Erin [name] [facility] ?Date of Birth: May 06, 1990 ? ? ?Transition of Care (TOC) CM/SW Contact:    ?Tetsuo Coppola A Domanique Luckett, LCSW ?Phone Number:[phone] ?09/05/2021, 11:40 AM ? ? ? ?Transition of Care Department [facility]) has reviewed patient and no TOC needs have been identified at this time. We will continue to monitor patient advancement through interdisciplinary progression rounds. If new patient transition needs arise, please place a TOC consult. ?  ?

## 2021-09-05 NOTE — Progress Notes (Signed)
Pt discharged per MD order. IV removed. Discharge instructions reviewed with pt. Pt verbalized understanding. All questions answered to pt satisfaction. Pt taken downstairs in wheelchair by volunteer.  

## 2021-09-05 NOTE — Anesthesia Postprocedure Evaluation (Signed)
Anesthesia Post Note ? ?Patient: Erin [name] ? ?Procedure(s) Performed: XI ROBOTIC ASSISTED LAPAROSCOPIC CHOLECYSTECTOMY ?INDOCYANINE GREEN FLUORESCENCE IMAGING (ICG) ? ?Patient location during evaluation: PACU ?Anesthesia Type: General ?Level of consciousness: awake and alert ?Pain management: pain level controlled ?Vital Signs Assessment: post-procedure vital signs reviewed and stable ?Respiratory status: spontaneous breathing, nonlabored ventilation, respiratory function stable and patient connected to nasal cannula oxygen ?Cardiovascular status: blood pressure returned to baseline and stable ?Postop Assessment: no apparent nausea or vomiting ?Anesthetic complications: no ? ? ?No notable events documented. ? ? ?Last Vitals:  ?Vitals:  ? 09/05/21 0452 09/05/21 0817  ?BP: 119/67 127/79  ?Pulse: 64 (!) 57  ?Resp: 18 18  ?Temp: 36.6 ?C 36.9 ?C  ?SpO2: 97% 99%  ?  ?Last Pain:  ?Vitals:  ? 09/05/21 0452  ?TempSrc: Oral  ?PainSc:   ? ? ?  ?  ?  ?  ?  ?  ? ?Lenard Simmer ? ? ? ? ?

## 2021-09-05 NOTE — Discharge Instructions (Signed)

## 2021-09-07 LAB — SURGICAL PATHOLOGY

## 2021-10-04 DIAGNOSIS — M47819 Spondylosis without myelopathy or radiculopathy, site unspecified: Secondary | ICD-10-CM | POA: Insufficient documentation

## 2021-10-16 ENCOUNTER — Encounter: Payer: Self-pay | Admitting: Internal Medicine

## 2021-10-17 ENCOUNTER — Encounter: Payer: Self-pay | Admitting: Internal Medicine

## 2021-10-17 ENCOUNTER — Encounter
Admission: RE | Disposition: A | Discharge status: Home / Self Care | Payer: Self-pay | Source: Home / Self Care | Attending: Internal Medicine

## 2021-10-17 ENCOUNTER — Ambulatory Visit: Payer: BC Managed Care – PPO | Admitting: Anesthesiology

## 2021-10-17 ENCOUNTER — Other Ambulatory Visit: Payer: Self-pay

## 2021-10-17 ENCOUNTER — Ambulatory Visit
Admission: RE | Admit: 2021-10-17 | Discharge: 2021-10-17 | Disposition: A | Discharge status: Home / Self Care | Payer: BC Managed Care – PPO | Attending: Internal Medicine | Admitting: Internal Medicine

## 2021-10-17 DIAGNOSIS — F419 Anxiety disorder, unspecified: Secondary | ICD-10-CM | POA: Diagnosis not present

## 2021-10-17 DIAGNOSIS — F32A Depression, unspecified: Secondary | ICD-10-CM | POA: Diagnosis not present

## 2021-10-17 DIAGNOSIS — M549 Dorsalgia, unspecified: Secondary | ICD-10-CM | POA: Diagnosis not present

## 2021-10-17 DIAGNOSIS — R519 Headache, unspecified: Secondary | ICD-10-CM | POA: Diagnosis not present

## 2021-10-17 DIAGNOSIS — K449 Diaphragmatic hernia without obstruction or gangrene: Secondary | ICD-10-CM | POA: Insufficient documentation

## 2021-10-17 DIAGNOSIS — K3189 Other diseases of stomach and duodenum: Secondary | ICD-10-CM | POA: Insufficient documentation

## 2021-10-17 DIAGNOSIS — G8929 Other chronic pain: Secondary | ICD-10-CM | POA: Diagnosis not present

## 2021-10-17 DIAGNOSIS — J45909 Unspecified asthma, uncomplicated: Secondary | ICD-10-CM | POA: Diagnosis not present

## 2021-10-17 DIAGNOSIS — R933 Abnormal findings on diagnostic imaging of other parts of digestive tract: Secondary | ICD-10-CM | POA: Diagnosis not present

## 2021-10-17 DIAGNOSIS — R1314 Dysphagia, pharyngoesophageal phase: Secondary | ICD-10-CM | POA: Insufficient documentation

## 2021-10-17 DIAGNOSIS — K219 Gastro-esophageal reflux disease without esophagitis: Secondary | ICD-10-CM | POA: Diagnosis not present

## 2021-10-17 DIAGNOSIS — Z87891 Personal history of nicotine dependence: Secondary | ICD-10-CM | POA: Insufficient documentation

## 2021-10-17 HISTORY — DX: Allergy status to unspecified drugs, medicaments and biological substances: Z88.9

## 2021-10-17 HISTORY — DX: Headache, unspecified: R51.9

## 2021-10-17 LAB — POCT PREGNANCY, URINE: Preg Test, Ur: NEGATIVE

## 2021-10-17 SURGERY — EGD (ESOPHAGOGASTRODUODENOSCOPY)
Anesthesia: General

## 2021-10-17 MED ORDER — PROPOFOL 10 MG/ML IV BOLUS
INTRAVENOUS | Status: DC | PRN
  Administered 2021-10-17: 40 mg via INTRAVENOUS
  Administered 2021-10-17: 80 mg via INTRAVENOUS

## 2021-10-17 MED ORDER — SODIUM CHLORIDE 0.9 % IV SOLN: INTRAVENOUS | Status: DC | PRN

## 2021-10-17 MED ORDER — SODIUM CHLORIDE 0.9 % IV SOLN: INTRAVENOUS | Status: DC

## 2021-10-17 MED ORDER — PROPOFOL 500 MG/50ML IV EMUL
INTRAVENOUS | Status: DC | PRN
  Administered 2021-10-17: 200 ug/kg/min via INTRAVENOUS

## 2021-10-17 NOTE — Anesthesia Preprocedure Evaluation (Signed)
Anesthesia Evaluation  Patient identified by MRN, date of birth, ID band Patient awake    Reviewed: Allergy & Precautions, H&P , NPO status , Patient's Chart, lab work & pertinent test results, reviewed documented beta blocker date and time   History of Anesthesia Complications Negative for: history of anesthetic complications  Airway Mallampati: II  TM Distance: >3 FB Neck ROM: full    Dental  (+) Dental Advidsory Given, Teeth Intact   Pulmonary neg shortness of breath, asthma , neg COPD, neg recent URI, former smoker,    Pulmonary exam normal breath sounds clear to auscultation       Cardiovascular Exercise Tolerance: Good negative cardio ROS Normal cardiovascular exam Rhythm:regular Rate:Normal     Neuro/Psych  Headaches, Seizures - (as a child),  PSYCHIATRIC DISORDERS Anxiety Depression    GI/Hepatic Neg liver ROS, GERD  ,  Endo/Other  negative endocrine ROS  Renal/GU negative Renal ROS  negative genitourinary   Musculoskeletal   Abdominal   Peds  Hematology negative hematology ROS (+)   Anesthesia Other Findings Past Medical History: No date: ADD (attention deficit disorder)     Comment:  ADHD No date: Allergic genetic state No date: Anxiety No date: Asthma No date: Chronic back pain No date: Depression     Comment:  postpartum depression in past No date: Fatigue No date: GERD (gastroesophageal reflux disease) No date: Headache No date: Numbness and tingling No date: Seasonal allergies No date: Seasonal allergies No date: Seizures (HCC)     Comment:  during childhood   Reproductive/Obstetrics negative OB ROS                             Anesthesia Physical Anesthesia Plan  ASA: 2  Anesthesia Plan: General   Post-op Pain Management:    Induction: Intravenous  PONV Risk Score and Plan: 3 and Propofol infusion and TIVA  Airway Management Planned: Natural Airway and  Nasal Cannula  Additional Equipment:   Intra-op Plan:   Post-operative Plan:   Informed Consent: I have reviewed the patients History and Physical, chart, labs and discussed the procedure including the risks, benefits and alternatives for the proposed anesthesia with the patient or authorized representative who has indicated his/her understanding and acceptance.     Dental Advisory Given  Plan Discussed with: Anesthesiologist, CRNA and Surgeon  Anesthesia Plan Comments:         Anesthesia Quick Evaluation

## 2021-10-17 NOTE — Interval H&P Note (Signed)
History and Physical Interval Note:  10/17/2021 3:04 PM  Erin [name]  has presented today for surgery, with the diagnosis of Pharyngoesophageal dysphagia (R13.14) Abnormal CT scan, gastrointestinal tract (R93.3) Gastroesophageal reflux disease, unspecified whether esophagitis present (K21.9).  The various methods of treatment have been discussed with the patient and family. After consideration of risks, benefits and other options for treatment, the patient has consented to  Procedure(s): ESOPHAGOGASTRODUODENOSCOPY (EGD) (N/A) as a surgical intervention.  The patient's history has been reviewed, patient examined, no change in status, stable for surgery.  I have reviewed the patient's chart and labs.  Questions were answered to the patient's satisfaction.     [place], [place]

## 2021-10-17 NOTE — Transfer of Care (Signed)
Immediate Anesthesia Transfer of Care Note  Patient: Erin [name]  Procedure(s) Performed: ESOPHAGOGASTRODUODENOSCOPY (EGD)  Patient Location: PACU  Anesthesia Type:General  Level of Consciousness: awake, alert  and oriented  Airway & Oxygen Therapy: Patient Spontanous Breathing and Patient connected to nasal cannula oxygen  Post-op Assessment: Report given to RN and Post -op Vital signs reviewed and stable  Post vital signs: Reviewed and stable  Last Vitals:  Vitals Value Taken Time  BP    Temp    Pulse    Resp    SpO2      Last Pain:  Vitals:   10/17/21 1439  TempSrc: Temporal  PainSc: 6          Complications: No notable events documented.

## 2021-10-17 NOTE — Op Note (Signed)
[facility] Gastroenterology Patient Name: Erin [name] Procedure Date: 10/17/2021 3:08 PM MRN: [id number] Account #: [id number] Date of Birth: 1990/06/18 Admit Type: Outpatient Age: 31 Room: [facility] ENDO ROOM 2 Gender: Female Note Status: Finalized Instrument Name: Upper Endoscope [id number] Procedure:             Upper GI endoscopy Indications:           Dysphagia, Suspected gastro-esophageal reflux disease,                         Globus sensation Providers:             Boykin Nearing. Greenly Rarick MD, MD Medicines:             Propofol per Anesthesia Complications:         No immediate complications. Procedure:             Pre-Anesthesia Assessment:                        - The risks and benefits of the procedure and the                         sedation options and risks were discussed with the                         patient. All questions were answered and informed                         consent was obtained.                        - Patient identification and proposed procedure were                         verified prior to the procedure by the nurse. The                         procedure was verified in the procedure room.                        - ASA Grade Assessment: III - A patient with severe                         systemic disease.                        - After reviewing the risks and benefits, the patient                         was deemed in satisfactory condition to undergo the                         procedure.                        After obtaining informed consent, the endoscope was                         passed under direct vision. Throughout the procedure,  the patient's blood pressure, pulse, and oxygen                         saturations were monitored continuously. The Endoscope                         was introduced through the mouth, and advanced to the                         third part of duodenum. The upper GI endoscopy  was                         accomplished without difficulty. The patient tolerated                         the procedure well. Findings:      The Z-line was irregular and was found at the gastroesophageal junction.      Patchy mildly erythematous mucosa without bleeding was found in the       gastric antrum.      A 1 cm hiatal hernia was present.      The examined duodenum was normal.      The exam was otherwise without abnormality.      There is no endoscopic evidence of stenosis, stricture, ulcerations,       mass or diverticula in the entire esophagus. Impression:            - Z-line irregular, at the gastroesophageal junction.                        - Erythematous mucosa in the antrum.                        - 1 cm hiatal hernia.                        - Normal examined duodenum.                        - The examination was otherwise normal.                        - No specimens collected. Recommendation:        - Patient has a contact number available for                         emergencies. The signs and symptoms of potential                         delayed complications were discussed with the patient.                         Return to normal activities tomorrow. Written                         discharge instructions were provided to the patient.                        - Resume previous diet.                        -  Continue present medications.                        - Return to physician assistant in 3 months.                        - Telephone GI office to schedule appointment.                        - The findings and recommendations were discussed with                         the patient. Procedure Code(s):     --- Professional ---                        [id number], Esophagogastroduodenoscopy, flexible,                         transoral; diagnostic, including collection of                         specimen(s) by brushing or washing, when performed                          (separate procedure) Diagnosis Code(s):     --- Professional ---                        F45.8, Other somatoform disorders                        R13.10, Dysphagia, unspecified                        K44.9, Diaphragmatic hernia without obstruction or                         gangrene                        K31.89, Other diseases of stomach and duodenum                        K22.8, Other specified diseases of esophagus CPT copyright 2019 [employer]. All rights reserved. The codes documented in this report are preliminary and upon coder review may  be revised to meet current compliance requirements. Efrain Sella MD, MD 10/17/2021 3:23:05 PM This report has been signed electronically. Number of Addenda: 0 Note Initiated On: 10/17/2021 3:08 PM Estimated Blood Loss:  Estimated blood loss: none. Estimated blood loss: none.      [facility]

## 2021-10-17 NOTE — H&P (Signed)
Outpatient short stay form Pre-procedure 10/17/2021 3:01 PM Erin [name], M.D.  Primary Physician: Kandyce Rud, M.D.  Reason for visit:  Pharyngoesophageal dysphagia, abnormal CT of GI tract, GERD  History of present illness:  31 y/o female with of GERD c/o liquid and solid dysphagia to the throat. No abdominal pain, melena, hematochezia or involuntary weight loss.CT abd showed hepatic steatosis, small hiatal hernia, gallstones with distended gallbladder, ovarian cyst, omental cyst.    Current Facility-Administered Medications:    0.9 %  sodium chloride infusion, , Intravenous, Continuous, Torian Thoennes, Boykin Nearing, MD, Last Rate: 20 mL/hr at 10/17/21 1452, New Bag at 10/17/21 1452  Medications Prior to Admission  Medication Sig Dispense Refill Last Dose   Adalimumab (HUMIRA) 40 MG/0.4ML PSKT Inject 40 mg into the skin.   Past Month   albuterol (VENTOLIN HFA) 108 (90 Base) MCG/ACT inhaler Inhale 2 puffs into the lungs every 6 (six) hours as needed for wheezing or shortness of breath.   Past Month   Alpha-Lipoic Acid 100 MG CAPS Take by mouth.   Past Week   buPROPion (WELLBUTRIN XL) 150 MG 24 hr tablet Take 150 mg by mouth daily.   10/16/2021   busPIRone (BUSPAR) 10 MG tablet Take 10 mg by mouth 3 (three) times daily.   10/16/2021   Cholecalciferol (VITAMIN D-3) 125 MCG (5000 UT) TABS Take by mouth daily.   10/16/2021   folic acid (FOLVITE) 1 MG tablet Take 1 mg by mouth daily.   10/16/2021   meloxicam (MOBIC) 15 MG tablet Take 15 mg by mouth daily.   10/16/2021   methotrexate 2.5 MG tablet Take 15 mg by mouth once a week.   Past Week   omeprazole (PRILOSEC) 20 MG capsule Take 1 tablet by mouth daily.   10/17/2021   pregabalin (LYRICA) 200 MG capsule Take 200 mg by mouth 2 (two) times daily.   10/17/2021   rizatriptan (MAXALT-MLT) 10 MG disintegrating tablet Take 1 tablet (10 mg total) by mouth as needed for migraine. May repeat in 2 hours if needed 9 tablet 11 Past Month   tiZANidine (ZANAFLEX)  4 MG tablet Take 4 mg by mouth 3 (three) times daily.   Past Week   venlafaxine XR (EFFEXOR-XR) 150 MG 24 hr capsule Take 150 mg by mouth daily.   10/16/2021   oxyCODONE-acetaminophen (PERCOCET) 5-325 MG tablet Take 1 tablet by mouth every 4 (four) hours as needed for severe pain. 20 tablet 0      Allergies  Allergen Reactions   Sulfa Antibiotics Hives   Sulfa Drugs Cross Reactors Other (See Comments)    Uncontrollable body shaking.     Past Medical History:  Diagnosis Date   ADD (attention deficit disorder)    ADHD   Allergic genetic state    Anxiety    Asthma    Chronic back pain    Depression    postpartum depression in past   Fatigue    GERD (gastroesophageal reflux disease)    Headache    Numbness and tingling    Seasonal allergies    Seasonal allergies    Seizures (HCC)    during childhood    Review of systems:  Otherwise negative.    Physical Exam  Gen: Alert, oriented. Appears stated age.  HEENT: [place]/AT. PERRLA. Lungs: CTA, no wheezes. CV: RR nl S1, S2. Abd: soft, benign, no masses. BS+ Ext: No edema. Pulses 2+    Planned procedures: Proceed with EGD. The patient understands the nature of the  planned procedure, indications, risks, alternatives and potential complications including but not limited to bleeding, infection, perforation, damage to internal organs and possible oversedation/side effects from anesthesia. The patient agrees and gives consent to proceed.  Please refer to procedure notes for findings, recommendations and patient disposition/instructions.     Erin [name], M.D. Gastroenterology 10/17/2021  3:01 PM

## 2021-10-19 ENCOUNTER — Encounter: Payer: Self-pay | Admitting: Internal Medicine
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
## 2021-12-08 ENCOUNTER — Ambulatory Visit (HOSPITAL_BASED_OUTPATIENT_CLINIC_OR_DEPARTMENT_OTHER): Payer: Medicaid Other | Admitting: Psychiatry

## 2021-12-08 ENCOUNTER — Encounter (HOSPITAL_COMMUNITY): Payer: Self-pay | Admitting: Psychiatry

## 2021-12-08 DIAGNOSIS — F4310 Post-traumatic stress disorder, unspecified: Secondary | ICD-10-CM

## 2021-12-08 DIAGNOSIS — F331 Major depressive disorder, recurrent, moderate: Secondary | ICD-10-CM | POA: Diagnosis not present

## 2021-12-08 MED ORDER — ARIPIPRAZOLE 2 MG PO TABS: 2.0000 mg | ORAL_TABLET | Freq: Every day | ORAL | 0 refills | Status: DC

## 2021-12-16 ENCOUNTER — Ambulatory Visit
Admission: EM | Admit: 2021-12-16 | Discharge: 2021-12-16 | Disposition: A | Discharge status: Home / Self Care | Payer: Medicaid Other | Attending: Emergency Medicine | Admitting: Emergency Medicine

## 2021-12-16 DIAGNOSIS — R519 Headache, unspecified: Secondary | ICD-10-CM | POA: Diagnosis not present

## 2021-12-16 MED ORDER — DEXAMETHASONE SODIUM PHOSPHATE 10 MG/ML IJ SOLN
10.0000 mg | Freq: Once | INTRAMUSCULAR | Status: AC
  Administered 2021-12-16: 10 mg via INTRAMUSCULAR

## 2021-12-16 MED ORDER — KETOROLAC TROMETHAMINE 30 MG/ML IJ SOLN
30.0000 mg | Freq: Once | INTRAMUSCULAR | Status: AC
  Administered 2021-12-16: 30 mg via INTRAMUSCULAR

## 2021-12-16 NOTE — Discharge Instructions (Signed)
You are being treated for a bad headache  On exam there were no abnormalities on your neurological exam  Please reach out to your pharmacy tomorrow to try to get your prescribed injections   You are being given an injection of Toradol and Decadron here in the office to help minimize your symptoms, may continue use of Tylenol and Excedrin while at home  While headache is present participate in low stimulation activities, avoiding loud noises and bright lights  Ensure that you are getting adequate fluid intake as dehydration may worsen your symptoms  At any point if you begin to experience the worst headache of your life please go to the nearest emergency department for further evaluation and management

## 2021-12-16 NOTE — ED Triage Notes (Signed)
Patient presents to Urgent Care with complaints of neck pain, HA, and fatigue x 4 days. Has a hx of chronic HA. States she has been having issues with her pharmacy and unable to get her medications filled. Taking tylenol and Excedrin for symptoms.   Denies changes to vision or N/V.

## 2021-12-16 NOTE — ED Provider Notes (Signed)
Erin [name]    CSN: [id number] Arrival date & time: 12/16/21  1322      History   Chief Complaint Chief Complaint  Patient presents with   Headache    Have had headache for 4 days now, weakness fatigue and neck pain - Entered by patient   Fatigue    HPI Erin [name] is a 31 y.o. female.   Patient presents with intermittent headaches for 4 days.  Endorses pain is present around the crown of the head.  Headaches typically lasting 5 to 6 hours for resolution.  Associated increased fatigue and patient has been sleeping hours and hours throughout the day, feels as if she was only awake for 2 hours yesterday.  Intermittent phonophobia and photophobia present.  Has attempted use of Tylenol and Excedrin which has been somewhat helpful.  Patient endorses a history of chronic headaches and has been prescribed an injectable medicine from her neurologist but has had difficulty obtaining medicine from the pharmacy, plans to reach out again tomorrow.  Denies dizziness, lightheadedness, memory or speech changes, changes in vision from baseline, weakness, fever, chills, URI symptoms.  Past Medical History:  Diagnosis Date   ADD (attention deficit disorder)    ADHD   Allergic genetic state    Anxiety    Asthma    Chronic back pain    Depression    postpartum depression in past   Fatigue    GERD (gastroesophageal reflux disease)    Headache    Numbness and tingling    Seasonal allergies    Seasonal allergies    Seizures (HCC)    during childhood    Patient Active Problem List   Diagnosis Date Noted   Acute cholecystitis 09/03/2021   Myofascial pain syndrome, cervical 05/26/2019   Cervico-occipital neuralgia 05/26/2019   Post-operative state 09/08/2015   Amniotic fluid leaking 08/12/2015   Nausea and vomiting in pregnancy 07/05/2015   Family history of Down syndrome 03/16/2015   Hyperemesis affecting pregnancy, antepartum 03/10/2015   Cough 03/10/2015   Viral  respiratory illness 03/10/2015    Past Surgical History:  Procedure Laterality Date   CESAREAN SECTION  04/29/2012   CESAREAN SECTION WITH BILATERAL TUBAL LIGATION Bilateral 09/08/2015   Procedure: CESAREAN SECTION WITH BILATERAL TUBAL LIGATION;  Surgeon: Suzy Bouchard, MD;  Location: [facility];  Service: Obstetrics;  Laterality: Bilateral;   CHOLECYSTECTOMY     ESOPHAGOGASTRODUODENOSCOPY N/A 10/17/2021   Procedure: ESOPHAGOGASTRODUODENOSCOPY (EGD);  Surgeon: Toledo, Boykin Nearing, MD;  Location: [facility];  Service: Gastroenterology;  Laterality: N/A;   TONSILLECTOMY     TUBAL LIGATION      OB History     Gravida  3   Para  2   Term  2   Preterm  0   AB  1   Living  2      SAB  1   IAB  0   Ectopic  0   Multiple  0   Live Births  2            Home Medications    Prior to Admission medications   Medication Sig Start Date End Date Taking? Authorizing Provider  Adalimumab (HUMIRA) 40 MG/0.4ML PSKT Inject 40 mg into the skin.    [provider]  albuterol (VENTOLIN HFA) 108 (90 Base) MCG/ACT inhaler Inhale 2 puffs into the lungs every 6 (six) hours as needed for wheezing or shortness of breath.    [provider]  Alpha-Lipoic Acid  100 MG CAPS Take by mouth.    [provider]  ARIPiprazole (ABILIFY) 2 MG tablet Take 1 tablet (2 mg total) by mouth at bedtime. 12/08/21 01/07/22  Neysa Hotter, MD  busPIRone (BUSPAR) 10 MG tablet Take 10 mg by mouth 3 (three) times daily.    [provider]  Cholecalciferol (VITAMIN D-3) 125 MCG (5000 UT) TABS Take by mouth daily.    [provider]  folic acid (FOLVITE) 1 MG tablet Take 1 mg by mouth daily. 07/25/21   [provider]  meloxicam (MOBIC) 15 MG tablet Take 15 mg by mouth daily.    [provider]  methotrexate 2.5 MG tablet Take 15 mg by mouth once a week. 08/08/21   [provider]  omeprazole (PRILOSEC) 20 MG capsule Take 1 tablet by  mouth daily. 06/25/21   [provider]  oxyCODONE-acetaminophen (PERCOCET) 5-325 MG tablet Take 1 tablet by mouth every 4 (four) hours as needed for severe pain. 09/05/21 09/05/22  Carolan Shiver, MD  pregabalin (LYRICA) 200 MG capsule Take 200 mg by mouth 2 (two) times daily. 08/21/21   [provider]  propranolol (INDERAL) 20 MG tablet Take 20 mg by mouth 2 (two) times daily as needed.    [provider]  rizatriptan (MAXALT-MLT) 10 MG disintegrating tablet Take 1 tablet (10 mg total) by mouth as needed for migraine. May repeat in 2 hours if needed 06/02/19   Lomax, Amy, NP  tiZANidine (ZANAFLEX) 4 MG tablet Take 4 mg by mouth 3 (three) times daily. 07/25/21   [provider]  venlafaxine XR (EFFEXOR-XR) 150 MG 24 hr capsule Take 225 mg by mouth daily. 08/01/21   [provider]    Family History Family History  Problem Relation Age of Onset   Anxiety disorder Mother    Depression Mother    Hypertension Mother    Arthritis Mother    Cancer Mother    Diabetes Mother    Migraines Mother    Cancer Father    Depression Maternal Uncle    Suicidality Maternal Uncle    Bipolar disorder Maternal Grandfather    Diabetes Maternal Grandmother     Social History Social History   Tobacco Use   Smoking status: Former    Packs/day: 0.10    Types: Cigarettes   Smokeless tobacco: Never  Vaping Use   Vaping Use: Never used  Substance Use Topics   Alcohol use: Yes    Comment: occasional    Drug use: Yes    Types: Marijuana    Comment: tussionex cough syrup x 1 week 03-16-15     Allergies   Sulfa antibiotics and Sulfa drugs cross reactors   Review of Systems Review of Systems  Constitutional: Negative.   HENT: Negative.    Respiratory: Negative.    Cardiovascular: Negative.   Neurological:  Positive for headaches. Negative for dizziness, tremors, seizures, syncope, facial asymmetry, speech difficulty, weakness, light-headedness and  numbness.     Physical Exam Triage Vital Signs ED Triage Vitals  Enc Vitals Group     BP 12/16/21 1406 (!) 137/104     Pulse Rate 12/16/21 1406 93     Resp 12/16/21 1406 16     Temp --      Temp src --      SpO2 12/16/21 1406 97 %     Weight --      Height --      Head Circumference --  Peak Flow --      Pain Score 12/16/21 1404 7     Pain Loc --      Pain Edu? --      Excl. in GC? --    No data found.  Updated Vital Signs BP (!) 137/104 (BP Location: Left Arm)   Pulse 93   Resp 16   LMP 11/27/2021 (Approximate)   SpO2 97%   Visual Acuity Right Eye Distance:   Left Eye Distance:   Bilateral Distance:    Right Eye Near:   Left Eye Near:    Bilateral Near:     Physical Exam Constitutional:      Appearance: She is well-developed.     Comments: Appears fatigued  HENT:     Head: Normocephalic.  Eyes:     Extraocular Movements: Extraocular movements intact.     Conjunctiva/sclera: Conjunctivae normal.     Pupils: Pupils are equal, round, and reactive to light.  Pulmonary:     Effort: Pulmonary effort is normal.  Skin:    General: Skin is warm and dry.  Neurological:     General: No focal deficit present.     Mental Status: She is alert and oriented to person, place, and time. Mental status is at baseline.     Cranial Nerves: No cranial nerve deficit.     Motor: No weakness.     Gait: Gait normal.     Comments: decreased sensation of the bilateral upper and lower extremities  Psychiatric:        Mood and Affect: Mood normal.        Behavior: Behavior normal.      UC Treatments / Results  Labs (all labs ordered are listed, but only abnormal results are displayed) Labs Reviewed - No data to display  EKG   Radiology No results found.  Procedures Procedures (including critical care time)  Medications Ordered in UC Medications  ketorolac (TORADOL) 30 MG/ML injection 30 mg (30 mg Intramuscular Given 12/16/21 1426)  dexamethasone (DECADRON)  injection 10 mg (10 mg Intramuscular Given 12/16/21 1426)    Initial Impression / Assessment and Plan / UC Course  I have reviewed the triage vital signs and the nursing notes.  Pertinent labs & imaging results that were available during my care of the patient were reviewed by me and considered in my medical decision making (see chart for details).  Bad headache  Vital signs are stable and well patient appears fatigued she is in no signs of distress, no acute abnormalities to the neurological exam, patient endorses severe neuropathy and therefore decreased sensation as baseline, given Toradol and Decadron injections in office and advised patient to continue to reach out to pharmacy to get her prescribed medicine, advised low stimulation activities while headache is present and increase fluid intake to prevent dehydration as a contributing factor, advised follow-up with neurologist if symptoms persist, advised that at any point if she begins to have the worst headache of her life that she is to go to the nearest hospital for further evaluation and management verbalized understanding Final Clinical Impressions(s) / UC Diagnoses   Final diagnoses:  Bad headache     Discharge Instructions      You are being treated for a bad headache  On exam there were no abnormalities on your neurological exam  Please reach out to your pharmacy tomorrow to try to get your prescribed injections   You are being given an injection of Toradol and Decadron here  in the office to help minimize your symptoms, may continue use of Tylenol and Excedrin while at home  While headache is present participate in low stimulation activities, avoiding loud noises and bright lights  Ensure that you are getting adequate fluid intake as dehydration may worsen your symptoms  At any point if you begin to experience the worst headache of your life please go to the nearest emergency department for further evaluation and  management   ED Prescriptions   None    PDMP not reviewed this encounter.   Valinda Hoar, NP 12/16/21 1437
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
## 2021-12-26 ENCOUNTER — Encounter: Payer: Medicaid Other | Admitting: Psychiatry

## 2021-12-26 NOTE — Progress Notes (Signed)
This encounter was created in error - please disregard.
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
## 2022-03-14 ENCOUNTER — Encounter: Payer: Self-pay | Admitting: Psychiatry

## 2022-03-14 ENCOUNTER — Ambulatory Visit (INDEPENDENT_AMBULATORY_CARE_PROVIDER_SITE_OTHER): Payer: Medicaid Other | Admitting: Psychiatry

## 2022-03-14 VITALS — BP 131/89 | HR 90 | Temp 97.3°F | Ht 68.0 in | Wt 271.6 lb

## 2022-03-14 DIAGNOSIS — F4310 Post-traumatic stress disorder, unspecified: Secondary | ICD-10-CM

## 2022-03-14 DIAGNOSIS — F331 Major depressive disorder, recurrent, moderate: Secondary | ICD-10-CM

## 2022-03-14 MED ORDER — VENLAFAXINE HCL ER 150 MG PO CP24: 150.0000 mg | ORAL_CAPSULE | Freq: Every day | ORAL | 1 refills | Status: DC

## 2022-03-14 MED ORDER — ARIPIPRAZOLE 2 MG PO TABS: 2.0000 mg | ORAL_TABLET | Freq: Every day | ORAL | 1 refills | Status: DC

## 2022-03-14 MED ORDER — METFORMIN HCL 500 MG PO TABS: 250.0000 mg | ORAL_TABLET | Freq: Every day | ORAL | 1 refills | Status: DC

## 2022-03-14 MED ORDER — VENLAFAXINE HCL ER 75 MG PO CP24
75.0000 mg | ORAL_CAPSULE | Freq: Every day | ORAL | 1 refills | Status: DC
Start: 2022-03-14 — End: 2022-04-05

## 2022-03-14 NOTE — Patient Instructions (Signed)
Continue venlafaxine to 225 mg daily Continue Abilify 2 mg at night Start metformin 250 mg at night  Continue propranolol 10 mg twice a day as needed for anxiety Discontinue buspirone Next appointment- 1/11 at 1 PM

## 2022-04-01 ENCOUNTER — Ambulatory Visit (INDEPENDENT_AMBULATORY_CARE_PROVIDER_SITE_OTHER): Payer: Medicaid Other | Admitting: Licensed Clinical Social Worker

## 2022-04-01 DIAGNOSIS — F331 Major depressive disorder, recurrent, moderate: Secondary | ICD-10-CM

## 2022-04-01 DIAGNOSIS — F4310 Post-traumatic stress disorder, unspecified: Secondary | ICD-10-CM | POA: Diagnosis not present

## 2022-04-01 NOTE — Plan of Care (Signed)
Initial treatment plan 04/01/2022. Pt contributed to plan and stated that electronic signature was fine.  Problem: Depression  Goal:  Depression  Description: Reduce overall frequency, intensity and duration of depression so that daily functioning is not impaired per pt self report 3 out of 5 sessions documented.   Outcome: Not Progressing Goal: Depression  Description: Recognize, accept and cope with feelings of depression per pt self report 3 out of 5 sessions.   Outcome: Not Progressing Goal: STG: Erin "Danielle" WILL PARTICIPATE IN AT LEAST 80% OF SCHEDULED INDIVIDUAL PSYCHOTHERAPY SESSIONS Outcome: Not Progressing Goal: Decrease behaviors that decrease motivation with Tx Description: Develop healthy thinking patterns about self, others and beliefs about self to help alleviate symptoms of depression per pt report 3 out 5 sessions.   Outcome: Not Progressing   Problem: Anxiety  Goal:  Anxiety  Description: Reduce overall frequency, intensity and duration of anxiety so that daily functioning is not impaired per pt self report 3 out of 5 sessions.   Outcome: Not Progressing Goal: Anxiety  Description: Resolve core conflicts that is the source of the anxiety per pt report 3 out of 5 sessions.   Outcome: Not Progressing Goal: STG: Patient will participate in at least 80% of scheduled individual psychotherapy sessions Outcome: Not Progressing   Problem: PTSD-Trauma  Goal:  Trauma Description: Pt will explore coping skills and learn coping and grounding skills to reduce symptoms of PTSD and prepare to handle future stressful situations as evidenced by implementing coping skills per pt report 3 out of 5 documented sessions.   Outcome: Not Progressing
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
## 2022-04-05 ENCOUNTER — Other Ambulatory Visit: Payer: Self-pay | Admitting: Psychiatry

## 2022-04-15 ENCOUNTER — Ambulatory Visit (INDEPENDENT_AMBULATORY_CARE_PROVIDER_SITE_OTHER): Payer: Medicaid Other | Admitting: Licensed Clinical Social Worker

## 2022-04-15 DIAGNOSIS — F4310 Post-traumatic stress disorder, unspecified: Secondary | ICD-10-CM | POA: Diagnosis not present

## 2022-04-15 DIAGNOSIS — F331 Major depressive disorder, recurrent, moderate: Secondary | ICD-10-CM

## 2022-04-15 NOTE — Progress Notes (Signed)
THERAPIST PROGRESS NOTE  Session Time: 8:03AM- 8:52AM   Participation Level: Active  Behavioral Response: Well GroomedAlertEuthymic  Type of Therapy: Individual Therapy  Treatment Goals addressed:  Self- Esteem Will Work with patient to decrease the frequency of negative self-descriptive statements and increase the frequency of positive self- descriptive statements using CBT/DBT/REBT techniques per patient self report 3 out of 5 documented sessions. Some of the techniques that will be used will be CBT, positive affirmations, role playing, modeling, homework and journaling.     Depression:Reduce overall frequency, intensity and duration of depression so that daily functioning is not impaired per pt self report 3 out of 5 sessions documented.    ProgressTowards Goals: Progressing  Interventions: CBT, Strength-based, and Supportive  Summary: Erin [name] is a 31 y.o. female who presents with continuing symptoms of anxiety and depression.  Pt reports that she is looking forward to starting hydrotherapy for her pain soon and stated that she is looking forward to trying something different to help with her pain. Pt stated that's he feels that she has not been a "good mother" stating that due to her pain that she does not do as much as she used to with her children and that she has to rest often. Pt stated that her daughter had asked her recently if she could cook because she used to cook often with her daughter.    Pt reports that in the past she used to enjoy cooking with her daughter and that she has not been able to do as much with them like she used to. Pt reports that her self-esteem has been low and that she has had issues with feelings that she was not enough in relationships and stated that she always puts herself second. Pt reports that she has a hard time viewing herself in a positive way. Encouraged the pt to explore her strengths. Engaged with the pt to explore activities  that she does daily that are strengths and explored with the pt ways she helps her children every day and the strengths she has of being a mother.   Pt was able to explore a few strengths and stated that she has a difficult time looking at all the positive things that happen in her life. Pt stated that she will use the technique of thinking of three good things that happen daily to her and not focusing just on the negative encounters that she has. Engaged the pt to explore how her thoughts impact her feelings and actions.   Pt denies SI/HI or A/V hallucinations.Pt was cooperative during visit and was engaged throughout the visit. Pt does not report any other concerns at the time of visit.     Suicidal/Homicidal: Nowithout intent/plan  Therapist Response: LCSW used CBT techniques to explore how thoughts,feelings and beliefs impact behavorial responses and physical symptoms. Explored how stressors can lead to negative thoughts that can be irrational or exaggerated which in return impacts how the person feels and can negatively impact how their body responds to include fatigue, sleep problems, poor concentration and loss of motivation. Explored how a behavioral response can create new stressors. Engaged with the pt to explore how their thoughts and feelings impact their behaviors and choices.     Engaged the pt in exploring her strengths and what qualities and characteristics she has that are strengths.   Educated the pt on positive coping skills to help with depression. Explored with the pt the negative thinking is a feature of depression  and that the positive experiences are minimized as a result. Explored how gratitude can help focus towards the positive experiences, and not magnify the negative experiences. Explored with the pt to write about three positive experiences from their day. Engaged the pt to look at all positive experiences and that they can be small. Provided the pt with examples of  positive experiences. Explored with the pt why the good experience was meaningful to them, engaged with the pt to explore how they can experience more of this good experience they had. Encouraged the pt to repeat this exercise every day for two weeks to strive to acknowledge positive experiences.    Allowed pt to explore thoughts and feelings associated with life situations and external stressors. Encouraged expression of feelings and used empathic listening. Pt was oriented to time, place and situation. LCSW validated the pts feelings and thoughts and showed unconditional positive regard.    Continued Recommendations as followed: Self-care behaviors, positive social engagements, focusing on positive physical and emotional wellness, and focusing on life/work balance.    Plan: Return again on 05/01/2022.   Diagnosis: MDD (major depressive disorder), recurrent episode, moderate (HCC)  PTSD (post-traumatic stress disorder)  Collaboration of Care: none   Patient/Guardian was advised Release of Information must be obtained prior to any record release in order to collaborate their care with an outside provider. Patient/Guardian was advised if they have not already done so to contact the registration department to sign all necessary forms in order for [place] to release information regarding their care.   Consent: Patient/Guardian gives verbal consent for treatment and assignment of benefits for services provided during this visit. Patient/Guardian expressed understanding and agreed to proceed.   Erin [name] 04/15/2022

## 2022-05-07 ENCOUNTER — Ambulatory Visit (INDEPENDENT_AMBULATORY_CARE_PROVIDER_SITE_OTHER): Payer: Medicaid Other | Admitting: Licensed Clinical Social Worker

## 2022-05-07 DIAGNOSIS — F4310 Post-traumatic stress disorder, unspecified: Secondary | ICD-10-CM

## 2022-05-07 DIAGNOSIS — F331 Major depressive disorder, recurrent, moderate: Secondary | ICD-10-CM | POA: Diagnosis not present

## 2022-05-07 NOTE — Progress Notes (Signed)
THERAPIST PROGRESS NOTE  Session Time: 4:01pm- 4:30PM   Participation Level: Active  Behavioral Response: Well GroomedAlertEuthymic  Type of Therapy: Individual Therapy  Treatment Goals addressed: Depression:Reduce overall frequency, intensity and duration of depression so that daily functioning is not impaired per pt self report 3 out of 5 sessions documented.    Intervention: Will work with the pt using CBT/DBT/REBT techniques to help the pt verbalize an understanding of the cognitive, physiological, and behavioral components of depression and its treatment. This will be done by using worksheets, interactive activities, CBT/ABC thought logs, modeling, homework, role playing and journaling. Will work with pt to learn and implement coping skills that result in a reduction of depression and improve daily functioning per pt self report 3 out of 5 documented sessions.   ProgressTowards Goals: Progressing  Interventions: CBT, Strength-based, and Supportive  Pt presented in person at [facility] office. Pt and LCSW were present during the visit.    Summary: Erin [name] is a 32 y.o. female who presents with continuing symptoms of anxiety and depression.  Pt reports  that she has started hydrotherapy for her pain  and stated that she has enjoyed it and that she was happy that she could be active.    Pt stated that she has been having symptoms os anxiety and depression and that she has been trying to find new ways on how she views herself and what she does. Pt reported that she has noticed that due to pain she will go to sleep and that she wants to do other activities than sleeping to cope. Pt was able to explore in session ways to cope in a healthy manner and stated that she enjoyed cross words in the past.   LCSW provided mood monitoring and treatment progress review in the context of this episode of treatment. LCSW reviewed the pt's mood status since last  session.    Pt was able to explore CBT in session and how her thoughts impacts her feelings and behaviors. Pt stated that she felt that journaling could be helpful for her to express her thoughts and keep track of positive experiences.   Allowed pt to explore thoughts and feelings associated with life situations and external stressors. Encouraged expression of feelings and used empathic listening. Pt was oriented to time, place and situation. LCSW validated the pts feelings and thoughts and showed unconditional positive regard.   Pt stated that she will continue to  use the technique of thinking of three good things that happen daily to her and not focusing just on the negative encounters that she has. Engaged the pt to explore how her thoughts impact her feelings and actions.   Pt denies SI/HI or A/V hallucinations.Pt was cooperative during visit and was engaged throughout the visit. Pt does not report any other concerns at the time of visit.     Suicidal/Homicidal: Nowithout intent/plan  Therapist Response: LCSW used CBT techniques to explore how thoughts,feelings and beliefs impact behavorial responses and physical symptoms. Explored how stressors can lead to negative thoughts that can be irrational or exaggerated which in return impacts how the person feels and can negatively impact how their body responds to include fatigue, sleep problems, poor concentration and loss of motivation. Explored how a behavioral response can create new stressors. Engaged with the pt to explore how their thoughts and feelings impact their behaviors and choices.    Explored how journaling can be helpful in acknowledging underlying thoughts and feelings and  can help increase self-understanding and self-awareness. Explored how writing down goals can be helpful in making plans and progressing towards a person's goals. Explored how journaling can be helpful in building self-esteem by using a journal to list good things  about oneself. Discussed how journaling can be helpful in recognizing the source of a problem and changing old patterns and behaviors and starting new ones. Educated the pt on the benefits of positive journaling and how it can be used to keep track of where you are now and can help create realistic plans to reach a goal. Encouraged the pt to record three successes of each day big or small to praise themselves for their accomplishments and to track positive experiences. Encouraged the pt to journal the various issues that they face and the parts of yourself that are involved and work on making success safe. Explored with the pt ways that they can do things differently to create new patterns for positive changes.      Allowed pt to explore thoughts and feelings associated with life situations and external stressors. Encouraged expression of feelings and used empathic listening. Pt was oriented to time, place and situation. LCSW validated the pts feelings and thoughts and showed unconditional positive regard.    Continued Recommendations as followed: Self-care behaviors, positive social engagements, focusing on positive physical and emotional wellness, and focusing on life/work balance.    Plan: Return again on 05/21/2022.   Diagnosis:  Encounter Diagnoses  Name Primary?   MDD (major depressive disorder), recurrent episode, moderate (HCC) Yes   PTSD (post-traumatic stress disorder)      Collaboration of Care: Pt encouraged to continue care with psychiatrist of record Dr. Modesta Messing.    Patient/Guardian was advised Release of Information must be obtained prior to any record release in order to collaborate their care with an outside provider. Patient/Guardian was advised if they have not already done so to contact the registration department to sign all necessary forms in order for [place] to release information regarding their care.   Consent: Patient/Guardian gives verbal consent for treatment and  assignment of benefits for services provided during this visit. Patient/Guardian expressed understanding and agreed to proceed.   Lorenda Hatchet 05/07/2022
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
## 2022-05-09 ENCOUNTER — Encounter: Payer: Self-pay | Admitting: Psychiatry

## 2022-05-09 ENCOUNTER — Encounter: Payer: Medicaid Other | Admitting: Psychiatry

## 2022-05-09 ENCOUNTER — Telehealth: Payer: Self-pay

## 2022-05-09 NOTE — Telephone Encounter (Signed)
Medication management - Prior authorization for patient's prescribed Aripiprazole 2 mg completed online with CoverMyMeds and sent to patient's [facility] plan for pending review.

## 2022-05-09 NOTE — Telephone Encounter (Signed)
Medication management - Email received from CoverMyMeds for patient's prior authorization for her Aripiprazole. Medication approved by [employer] [place] Medicaid.

## 2022-05-10 ENCOUNTER — Telehealth: Payer: Self-pay

## 2022-05-10 NOTE — Telephone Encounter (Signed)
recieved fax that prior auth for the aripiprazole 2mg  was approved for 04-29-22 to 05-09-23.

## 2022-05-13 NOTE — Progress Notes (Deleted)
This encounter was created in error - please disregard.

## 2022-05-13 NOTE — Progress Notes (Signed)
This encounter was created in error - please disregard.

## 2022-05-13 NOTE — Progress Notes (Deleted)
This encounter was created in error - please disregard. This encounter was created in error - please disregard.

## 2022-05-14 DIAGNOSIS — F4310 Post-traumatic stress disorder, unspecified: Secondary | ICD-10-CM | POA: Insufficient documentation

## 2022-05-14 DIAGNOSIS — G609 Hereditary and idiopathic neuropathy, unspecified: Secondary | ICD-10-CM | POA: Insufficient documentation

## 2022-05-14 DIAGNOSIS — F418 Other specified anxiety disorders: Secondary | ICD-10-CM | POA: Insufficient documentation

## 2022-05-14 DIAGNOSIS — G43709 Chronic migraine without aura, not intractable, without status migrainosus: Secondary | ICD-10-CM | POA: Insufficient documentation

## 2022-05-21 ENCOUNTER — Ambulatory Visit (INDEPENDENT_AMBULATORY_CARE_PROVIDER_SITE_OTHER): Payer: Medicaid Other | Admitting: Licensed Clinical Social Worker

## 2022-05-21 DIAGNOSIS — F331 Major depressive disorder, recurrent, moderate: Secondary | ICD-10-CM

## 2022-05-21 DIAGNOSIS — F4310 Post-traumatic stress disorder, unspecified: Secondary | ICD-10-CM | POA: Diagnosis not present

## 2022-05-21 NOTE — Progress Notes (Signed)
THERAPIST PROGRESS NOTE  Session Time: 8:00am-8:37am   Participation Level: Active  Behavioral Response: Well GroomedAlertEuthymic  Type of Therapy: Individual Therapy  Treatment Goals addressed: Depression:Reduce overall frequency, intensity and duration of depression so that daily functioning is not impaired per pt self report 3 out of 5 sessions documented.    Anxiety: Reduce overall frequency, intensity and duration of anxiety so that daily functioning is not impaired per pt self report 3 out of 5 sessions.    Intervention: Will work with the pt using CBT/DBT/REBT techniques to help the pt verbalize an understanding of the cognitive, physiological, and behavioral components of depression and its treatment. This will be done by using worksheets, interactive activities, CBT/ABC thought logs, modeling, homework, role playing and journaling. Will work with pt to learn and implement coping skills that result in a reduction of depression and improve daily functioning per pt self report 3 out of 5 documented sessions.   Intervention: Will work with pt to learn and implement coping skills that result in a reduction of anxiety and improve daily functioning per pt self report 3 out of 5 documented sessions.   ProgressTowards Goals: Progressing  GAD-7 score on 05/07/2022 was a score of 3 and on 05/21/2022 visit GAD-7 score was a 2. PHQ-9 score on 05/07/2022 was a score of 11 and on 04/21/2023 and on 05/21/2022 visit PHQ-9 score was a 7.   Interventions: CBT, Supportive, and Meditation: Guided Imagery Leaves on a Stream  Pt presented in person at [facility] office. Pt and LCSW were present during the visit.    Summary: Shakthi Scipio is a 32 y.o. female who presents with continuing symptoms of anxiety and depression.  Pt reports that due to her pain that she has not been doing as much as she does daily. Pt stated that she is trying to use coping skills and that  she wants to learn new ways to help manage her feelings of being anxious and being in pain.    Pt stated that she has been having symptoms os anxiety and depression and that she has been trying to do things with her children. Pt reported that she cooked for the first time in a while with her daughter and that it was nice to cook again and it felt good.    LCSW provided mood monitoring and treatment progress review in the context of this episode of treatment. LCSW reviewed the pt's mood status since last session.    Pt was able to explore CBT in session and how her thoughts impacts her feelings and behaviors. Pt stated that she felt that journaling could be helpful and that she has not tried the journaling et but that she had bought a journal and was planning on trying to journal.   Allowed pt to explore thoughts and feelings associated with life situations and external stressors. Encouraged expression of feelings and used empathic listening. Pt was oriented to time, place and situation. LCSW validated the pts feelings and thoughts and showed unconditional positive regard. Pt stated that she will try to use the grounding techniques dicussed in session and that she will report back on if it was helpful for her. Pt stated that she finds it helpful to take deep breaths and that she will continue to use the deep breathing technique discussed in session when she is feeling anxious.   Discussed with the pt the transition of a new therapist and provided the pt with the choice of  continuing services with the new therapist and answered any questions that the pt had about the transition.  Pt agreed to the transition to the new therapist.   Pt is continuing to apply interventions/techniques learned in session into daily life situations. Pt is currently on track to meet goals utilizing interventions that are discussed in session. Treatment to continue as indicated. Personal growth and progress toward goals noted  above.   Pt denies SI/HI or A/V hallucinations.Pt was cooperative during visit and was engaged throughout the visit. Pt does not report any other concerns at the time of visit.     Suicidal/Homicidal: Nowithout intent/plan  Therapist Response: Educated on grounding technique 5-4-3-2-1 technique to help control difficult emotions and symptoms by turning attention away from those uncomfortable thoughts, memories or worries and focusing on the present moment. Using the 5-4-3-2-1 technique to take in the details of their surrounding by using the five senses. Explored each step with the pt and engaged the pt to try the technique at home when they are facing difficult emotions. What are five things that you can see by exploring small details in their environment to include an object, the way that the light reflects off another surface or any other detail of what they see. Next exploring four things that you can feel. Noticing the sensations of how an object feels, how is the chair that they are sitting on feels, how does sun feel on your skin, tuning in to the different sensations of touch. Exploring three things that you can hear to include the sound of the wind, the sound of a clock and other surrounding sounds that are in the environment you are in. Then noticing two things that the pt can smell, to include the air around then do they smell fresh cut grass, a candle or food. Lastly, exploring taste and if there is anything they can taste to include gum, food or another flavors. Engaged with the pt to strive to notice the small details of this technique that usually they would tune out such as distant noises and objects that they see on a daily basis. LCSW encouraged the pt to explore their thoughts and feelings about the technique and how it could be helpful to allow the pt to be present and mindful in the moment.    Educated on mindful deep breathing with the pt. Explored how the technique could help  alleviate anxiety. Engaged with the pt to inhale for four seconds, then hold their air in their lungs for four seconds and then slowly exhale for six seconds. Encouraged the pt to practice the mindful deep breathing at home or any place where they feel that they need to take a few minutes to focus on breathing and being in the moment. Discussed with the pt how the technique could be effective in helping with anxiety and is a discreet way to help them stay grounded. Educated on guided imagery and the mindfulness exercise Leaves on a Stream in session.     LCSW used CBT techniques to explore how thoughts,feelings and beliefs impact behavorial responses and physical symptoms. Explored how stressors can lead to negative thoughts that can be irrational or exaggerated which in return impacts how the person feels and can negatively impact how their body responds to include fatigue, sleep problems, poor concentration and loss of motivation. Explored how a behavioral response can create new stressors. Engaged with the pt to explore how their thoughts and feelings impact their behaviors and choices.  Continued Recommendations as followed: Self-care behaviors, positive social engagements, focusing on positive physical and emotional wellness, and focusing on life/work balance.    Plan: Return again on  06/04/2022.   Diagnosis:  Encounter Diagnoses  Name Primary?   MDD (major depressive disorder), recurrent episode, moderate (HCC) Yes   PTSD (post-traumatic stress disorder)        Collaboration of Care: Pt encouraged to continue care with psychiatrist of record Dr. Modesta Messing.    Patient/Guardian was advised Release of Information must be obtained prior to any record release in order to collaborate their care with an outside provider. Patient/Guardian was advised if they have not already done so to contact the registration department to sign all necessary forms in order for [place] to release information  regarding their care.   Consent: Patient/Guardian gives verbal consent for treatment and assignment of benefits for services provided during this visit. Patient/Guardian expressed understanding and agreed to proceed.   Lorenda Hatchet 05/21/2022

## 2022-05-29 ENCOUNTER — Ambulatory Visit: Payer: Medicaid Other | Admitting: Licensed Clinical Social Worker

## 2022-05-29 DIAGNOSIS — I10 Essential (primary) hypertension: Secondary | ICD-10-CM | POA: Insufficient documentation

## 2022-05-29 HISTORY — DX: Essential (primary) hypertension: I10

## 2022-06-04 ENCOUNTER — Other Ambulatory Visit: Payer: Self-pay | Admitting: Psychiatry

## 2022-06-04 ENCOUNTER — Ambulatory Visit (INDEPENDENT_AMBULATORY_CARE_PROVIDER_SITE_OTHER): Payer: Medicaid Other | Admitting: Licensed Clinical Social Worker

## 2022-06-04 DIAGNOSIS — F4310 Post-traumatic stress disorder, unspecified: Secondary | ICD-10-CM

## 2022-06-04 DIAGNOSIS — F331 Major depressive disorder, recurrent, moderate: Secondary | ICD-10-CM | POA: Diagnosis not present

## 2022-06-04 NOTE — Progress Notes (Signed)
THERAPIST PROGRESS NOTE  Session Time: 8:00am-8:30am   Participation Level: Active  Behavioral Response: Well GroomedAlertDepressed  Type of Therapy: Individual Therapy  Treatment Goals addressed: Depression:Reduce overall frequency, intensity and duration of depression so that daily functioning is not impaired per pt self report 3 out of 5 sessions documented.    Anxiety: Reduce overall frequency, intensity and duration of anxiety so that daily functioning is not impaired per pt self report 3 out of 5 sessions.    Intervention: Will work with the pt using CBT/DBT/REBT techniques to help the pt verbalize an understanding of the cognitive, physiological, and behavioral components of depression and its treatment. This will be done by using worksheets, interactive activities, CBT/ABC thought logs, modeling, homework, role playing and journaling. Will work with pt to learn and implement coping skills that result in a reduction of depression and improve daily functioning per pt self report 3 out of 5 documented sessions.   Intervention: Will work with pt to learn and implement coping skills that result in a reduction of anxiety and improve daily functioning per pt self report 3 out of 5 documented sessions.   ProgressTowards Goals: Progressing  PHQ-9 on 05/21/2022 was 7 and on 06/04/2022 PHQ-9 score was a 5.    Interventions: CBT, Strength-based, and Supportive  Pt presented in person at [facility] office. Pt and LCSW were present during the visit.  Joellen Jersey Bounds was present during the visit.   Summary: Marvelyn Bouchillon is a 32 y.o. female who presents with continuing symptoms of anxiety and depression.  Pt reports that due to her pain that she has not been doing as much as she does daily. Pt stated that she has been trying to use her deep breathing technique dicussed at the last session.   Pt stated that she has been able to find joy in spending time with her  family. Pt stated that her daughter enjoys cheer and that she was able to spend quality time with her daughter.    Pt stated that she has been having symptoms os anxiety and depression. Pt stated that she has been having pain and that she has had to call out of work several times and that she has been trying to find ways to motivate herself and build on her positive experiences.  LCSW provided mood monitoring and treatment progress review in the context of this episode of treatment. LCSW reviewed the pt's mood status since last session.    Pt was able to explore in session ways that she can build on positive experiences that she has and stated that she will try and write down positive experiences as they happen with her children and read those experiences when she is feeling down. Pt stated that she has been journaling and that she has been able to listen music and books and that has helped her with symptoms.    Allowed pt to explore thoughts and feelings associated with life situations and external stressors. Encouraged expression of feelings and used empathic listening. Pt was oriented to time, place and situation. LCSW validated the pts feelings and thoughts and showed unconditional positive regard.   Pt was able to explore in session her strengths and was able to explore that she is trying to take it day by day and cope with her stressors and pain.   Pt is continuing to apply interventions/techniques learned in session into daily life situations. Pt is currently on track to meet goals utilizing interventions that  are discussed in session. Treatment to continue as indicated. Personal growth and progress toward goals noted above.   Pt denies SI/HI or A/V hallucinations.Pt was cooperative during visit and was engaged throughout the visit. Pt does not report any other concerns at the time of visit.     Suicidal/Homicidal: Nowithout intent/plan  Therapist Response: Educated on acceptance and  commitment therapy and explored how skills can be taught to accept painful and unwanted emotions instead of avoiding them. Encouraged pt to be present in the here and now and explored with the pt what is really important to them. Discussed with the pt the importance of discovering their values and their commitment to action to purse the important things in their life. Validated the pts feelings and thoughts about the values and what is important to them. Engaged the client to explore what acceptance means to them and explored the pts willingness to experience difficult thoughts and how ready they are to accept and acknowledge those difficult thoughts.    Encouraged the pt to participate in activities in which they will experience success and achievement. Explored with the pt distraction as a coping mechanism and explored cognitive distractions, behavioral distraction and physiological distractions. Provided pt with examples of each form of distractions and explored with pt new ways that they could implement the distractions into their daily routine when they face difficult emotions and stressors.    LCSW used CBT techniques to explore how thoughts,feelings and beliefs impact behavorial responses and physical symptoms. Explored how stressors can lead to negative thoughts that can be irrational or exaggerated which in return impacts how the person feels and can negatively impact how their body responds to include fatigue, sleep problems, poor concentration and loss of motivation. Explored how a behavioral response can create new stressors. Engaged with the pt to explore how their thoughts and feelings impact their behaviors and choices.    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        Plan: Discussed with the pt the transition of a new therapist and provided the pt with the choice of  continuing services with the new therapist and answered any questions that the pt had about the transition.  Pt agreed to the transition to [employer], LCSW.    Diagnosis:  Encounter Diagnoses  Name Primary?   MDD (major depressive disorder), recurrent episode, moderate (HCC) Yes   PTSD (post-traumatic stress disorder)      Collaboration of Care: Pt encouraged to continue care with psychiatrist of record Dr. Modesta Messing.    Patient/Guardian was advised Release of Information must be obtained prior to any record release in order to collaborate their care with an outside provider. Patient/Guardian was advised if they have not already done so to contact the registration department to sign all necessary forms in order for [place] to release information regarding their care.   Consent: Patient/Guardian gives verbal consent for treatment and assignment of benefits for services provided during this visit. Patient/Guardian expressed understanding and agreed to proceed.   Lorenda Hatchet 06/04/2022
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
## 2022-06-18 ENCOUNTER — Other Ambulatory Visit: Payer: Self-pay | Admitting: Psychiatry

## 2022-06-20 ENCOUNTER — Ambulatory Visit (INDEPENDENT_AMBULATORY_CARE_PROVIDER_SITE_OTHER): Payer: Medicaid Other | Admitting: Psychiatry

## 2022-06-20 ENCOUNTER — Encounter: Payer: Self-pay | Admitting: Psychiatry

## 2022-06-20 VITALS — BP 133/92 | HR 77 | Temp 97.5°F | Ht 67.0 in | Wt 278.4 lb

## 2022-06-20 DIAGNOSIS — R635 Abnormal weight gain: Secondary | ICD-10-CM | POA: Diagnosis not present

## 2022-06-20 DIAGNOSIS — F330 Major depressive disorder, recurrent, mild: Secondary | ICD-10-CM

## 2022-06-20 DIAGNOSIS — F4310 Post-traumatic stress disorder, unspecified: Secondary | ICD-10-CM

## 2022-06-20 DIAGNOSIS — T50905A Adverse effect of unspecified drugs, medicaments and biological substances, initial encounter: Secondary | ICD-10-CM | POA: Diagnosis not present

## 2022-06-20 MED ORDER — METFORMIN HCL 500 MG PO TABS: 500.0000 mg | ORAL_TABLET | Freq: Every day | ORAL | 0 refills | Status: DC

## 2022-06-20 MED ORDER — ARIPIPRAZOLE 2 MG PO TABS: 2.0000 mg | ORAL_TABLET | Freq: Every day | ORAL | 0 refills | Status: DC

## 2022-06-20 MED ORDER — VENLAFAXINE HCL ER 150 MG PO CP24: 150.0000 mg | ORAL_CAPSULE | Freq: Every day | ORAL | 1 refills | Status: DC

## 2022-06-20 MED ORDER — VENLAFAXINE HCL ER 75 MG PO CP24: 75.0000 mg | ORAL_CAPSULE | Freq: Every day | ORAL | 1 refills | Status: DC

## 2022-06-20 NOTE — Patient Instructions (Signed)
Continue venlafaxine to 225 mg daily Continue Abilify 2 mg at night Obtain EKG  Increase metformin 500 mg at night  Start metformin 250 mg at night  Hold propranolol Next appointment- 4/23 at 3 PM

## 2022-06-26 ENCOUNTER — Ambulatory Visit: Payer: Medicaid Other

## 2022-06-28 ENCOUNTER — Ambulatory Visit
Admission: RE | Admit: 2022-06-28 | Discharge: 2022-06-28 | Disposition: A | Discharge status: Home / Self Care | Payer: Medicaid Other | Source: Ambulatory Visit | Attending: Psychiatry | Admitting: Psychiatry

## 2022-06-28 DIAGNOSIS — F330 Major depressive disorder, recurrent, mild: Secondary | ICD-10-CM

## 2022-06-30 IMAGING — CT CT ABD-PELV W/ CM
2 of 4 series · 15 of 46 positions shown, 17 images · IV contrast (agent unspecified)
Comparison: None Available.

CLINICAL DATA: Diffuse and epigastric pain.  Vomiting.

EXAM:
CT ABDOMEN AND PELVIS WITH CONTRAST
TECHNIQUE: Multidetector CT imaging of the abdomen and pelvis was performed
using the standard protocol following bolus administration of
intravenous contrast.

[Series 2: routine abd/pel with · axial · 0.98mm/px · z∈[-508,-18]mm · 12 of 108 slices shown, 14 images]
[im 5/108  soft-tissue]
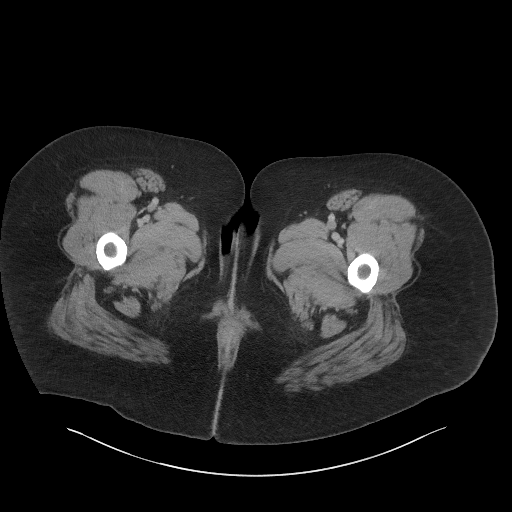
[im 5/108  bone]
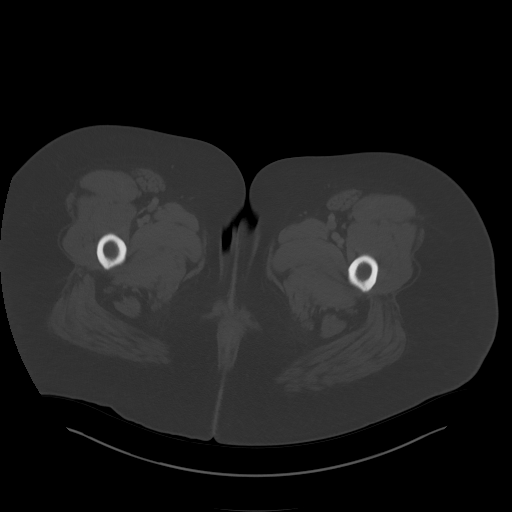
[im 14/108  soft-tissue]
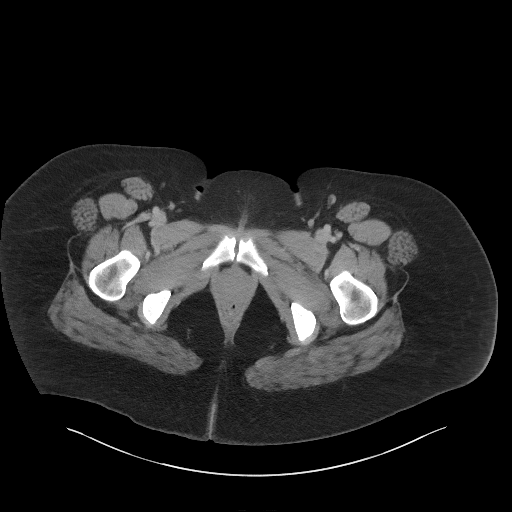
[im 23/108  soft-tissue]
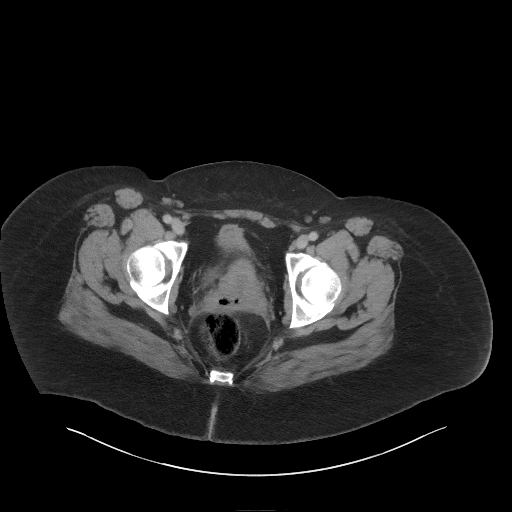
[im 32/108  soft-tissue]
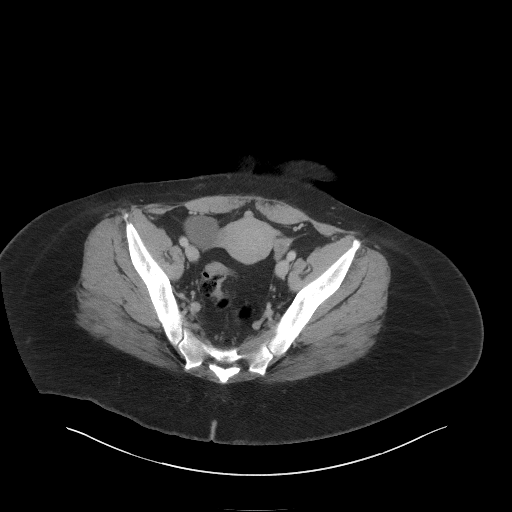
[im 41/108  soft-tissue]
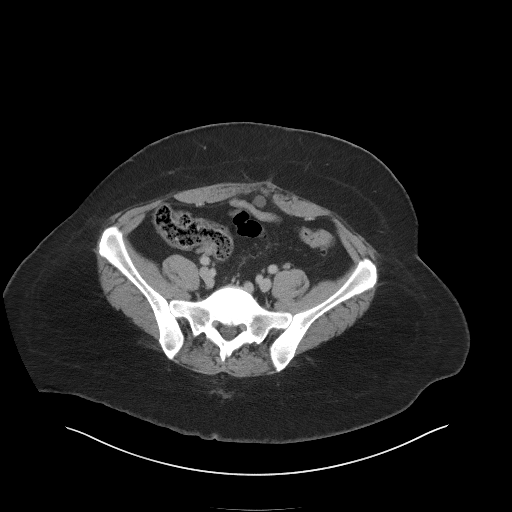
[im 50/108  soft-tissue]
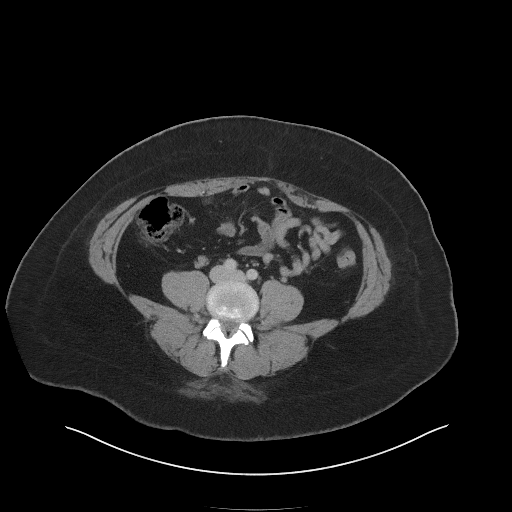
[im 58/108  soft-tissue]
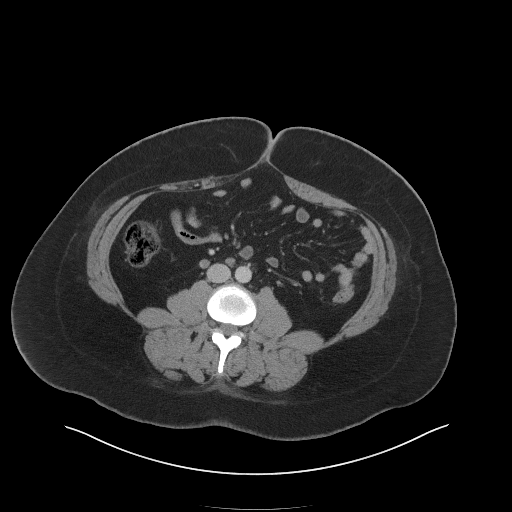
[im 67/108  soft-tissue]
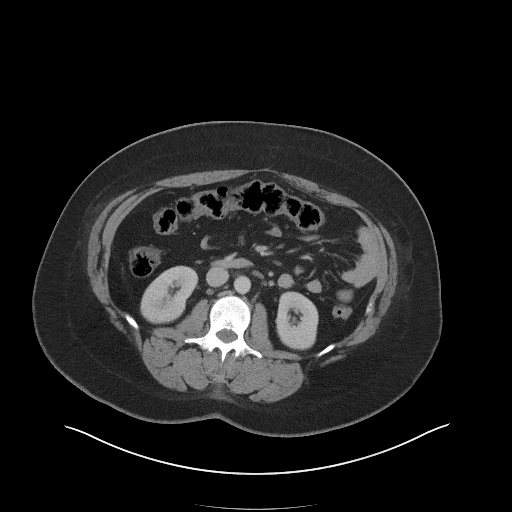
[im 76/108  soft-tissue]
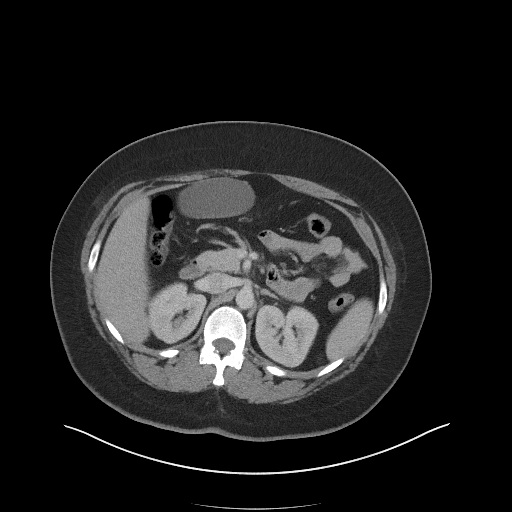
[im 76/108  bone]
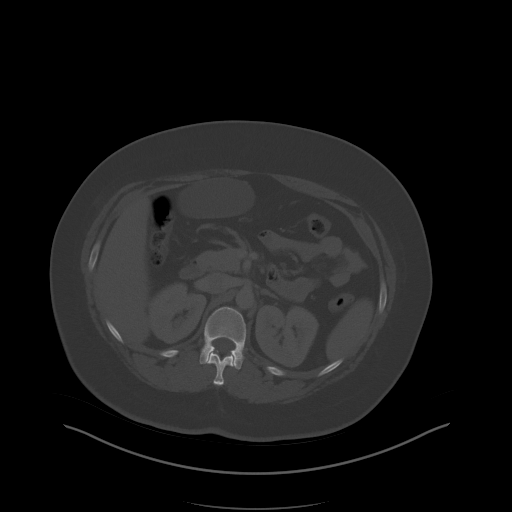
[im 85/108  soft-tissue]
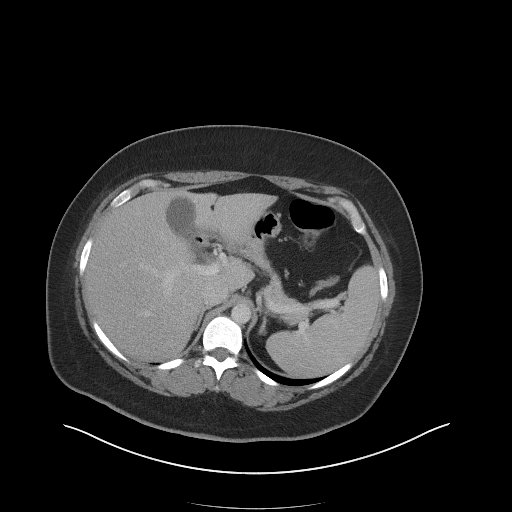
[im 94/108  soft-tissue]
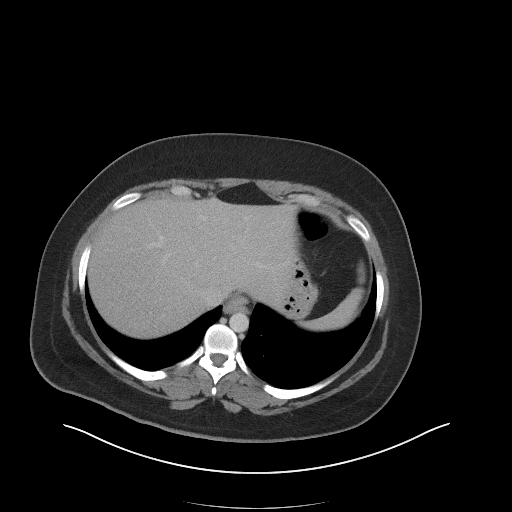
[im 103/108  soft-tissue]
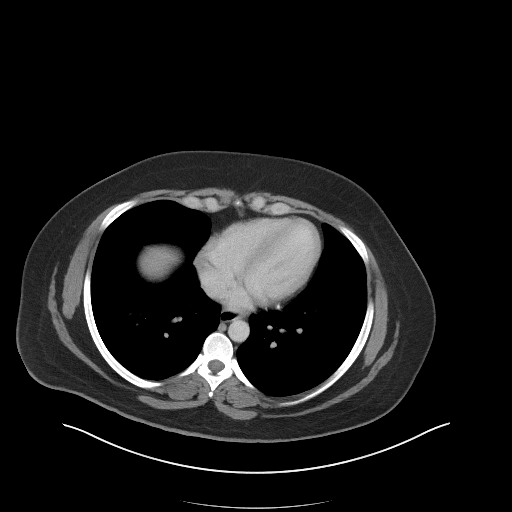

[Series 5: coronal st · coronal · 0.96mm/px · 3 of 108 slices shown]
[im 36/108  soft-tissue]
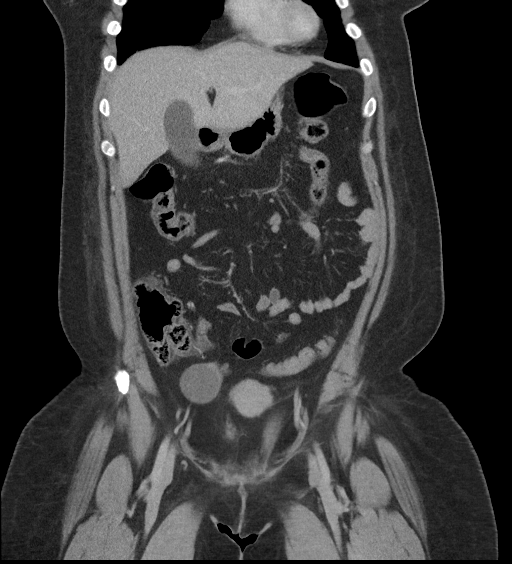
[im 48/108  soft-tissue]
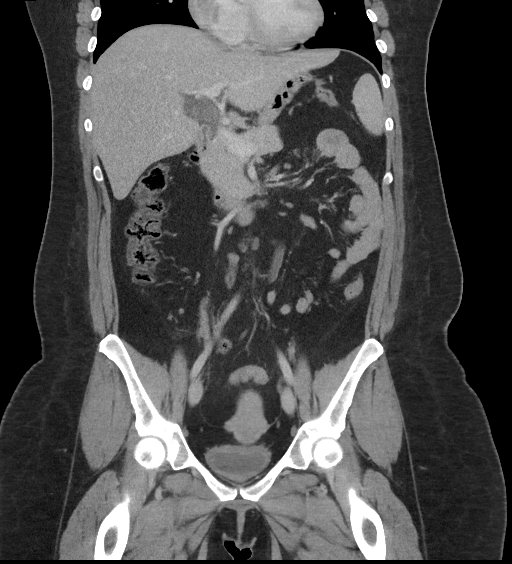
[im 60/108  soft-tissue]
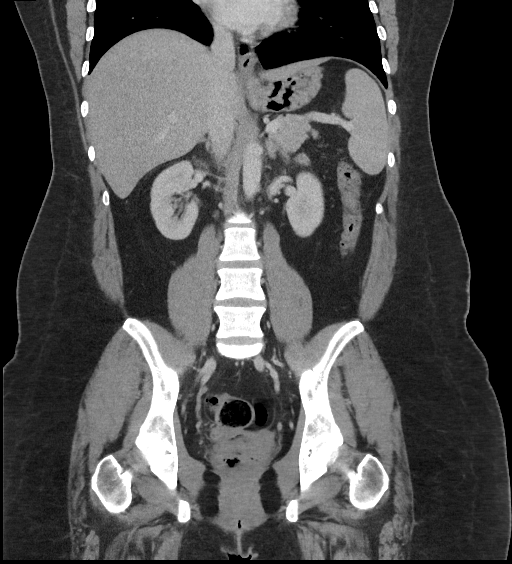

[15 of 46 positions shown; findings below may reference images not displayed]

RADIATION DOSE REDUCTION: This exam was performed according to the
departmental dose-optimization program which includes automated
exposure control, adjustment of the mA and/or kV according to
patient size and/or use of iterative reconstruction technique.

CONTRAST:  100mL OMNIPAQUE IOHEXOL 300 MG/ML  SOLN
FINDINGS: Lower chest: No focal airspace disease or pleural effusion. There is
a small hiatal hernia. Mild wall thickening of the distal esophagus
which is patulous.

Hepatobiliary: Subjective hepatic steatosis. No focal liver lesion
is seen. Mild gallbladder distension. Vague intraluminal densities
may represent noncalcified gallstones no pericholecystic
inflammation. No biliary dilatation.

Pancreas: No ductal dilatation or inflammation.

Spleen: Normal in size without focal abnormality.

Adrenals/Urinary Tract: Normal adrenal glands. No hydronephrosis or
perinephric edema. Homogeneous renal enhancement. No renal stone or
focal renal lesion. Urinary bladder is only minimally distended and
not well assessed.

Stomach/Bowel: Patulous distal esophagus the small hiatal hernia and
minimal distal esophageal wall thickening. Stomach is nondistended.
Majority of small bowel is decompressed. No small bowel obstruction
or inflammatory change. The appendix is normal. Occasional
descending colonic diverticula without diverticulitis.

Vascular/Lymphatic: Normal caliber abdominal aorta. Patent portal,
splenic, and mesenteric veins. No abdominopelvic adenopathy.

Reproductive: There is a 4.5 cm minimally complex right ovarian
cyst. Quiescent appearance of the left ovary. Anterior deviation of
the uterus consistent with prior Caesarean section.

Other: No ascites. No free air or abdominopelvic collection. There
is a 10 mm cyst within the omentum of the lower anterior abdomen,
series 2, image 68. This is no surrounding inflammation, and is of
uncertain significance and etiology. This potentially may represent
a peritoneal inclusion cyst.

Musculoskeletal: There are no acute or suspicious osseous
abnormalities.
IMPRESSION: 1. Small hiatal hernia with patulous distal esophagus, suggesting
reflux. Mild distal esophageal wall thickening.
2. Right ovarian cyst measuring 4.5 cm, minimally complex. Unless
there are symptoms referable to the right pelvis, no follow-up
imaging recommended. Note: This recommendation does not apply to
premenarchal patients and to those with increased risk (genetic,
family history, elevated tumor markers or other high-risk factors)
of ovarian cancer. Reference: JACR [DATE]):[id number]
3. Subjective hepatic steatosis. Vague intraluminal densities in the
gallbladder may represent noncalcified gallstones. No
pericholecystic inflammation. As clinically indicated, consider
right upper quadrant ultrasound.
4. Minimal descending colonic diverticulosis without diverticulitis.
5. A 10 mm cyst within the omentum of the lower anterior abdomen is
of uncertain significance and etiology. This potentially may
represent a peritoneal inclusion cyst.

## 2022-07-04 NOTE — Progress Notes (Signed)
Pt was notified.  

## 2022-07-16 ENCOUNTER — Ambulatory Visit (INDEPENDENT_AMBULATORY_CARE_PROVIDER_SITE_OTHER): Payer: Medicaid Other | Admitting: Licensed Clinical Social Worker

## 2022-07-16 DIAGNOSIS — F4310 Post-traumatic stress disorder, unspecified: Secondary | ICD-10-CM | POA: Diagnosis not present

## 2022-07-16 DIAGNOSIS — F330 Major depressive disorder, recurrent, mild: Secondary | ICD-10-CM | POA: Diagnosis not present

## 2022-07-22 NOTE — Progress Notes (Signed)
THERAPIST PROGRESS NOTE  Session Time: 2:00PM-2:56PM  Participation Level: Active  Behavioral Response: Casual, Neat, and Well GroomedAlertEuthymic  Type of Therapy: Individual Therapy  Treatment Goals addressed:  Reduce overall frequency, intensity and duration of anxiety so that daily functioning is not impaired per pt self report 3 out of 5 sessions.   Reduce overall frequency, intensity and duration of depression so that daily functioning is not impaired per pt self report 3 out of 5 sessions documented.   Pt will explore coping skills and learn coping and grounding skills to reduce symptoms of PTSD and prepare to handle future stressful situations as evidenced by implementing coping skills per pt report 3 out of 5 documented sessions.   ProgressTowards Goals: Progressing  Interventions: CBT, DBT, Motivational Interviewing, Strength-based, and Other: ACT  Summary: Erin [name] is a 32 y.o. female who presents with continued sxs of anxiety and depression related to a hx of trauma. Sxs endorsed including but not limited to lack of interest, depressed mood, difficulty sleeping, fatigue, difficulty concentrating, flashbacks, re-traumatizations, worry, difficulty controlling worry, and fearfulness. Pt oriented to person, place, and time. Pt denies SI/HI or A/V hallucinations. Pt was cooperative during visit and was engaged throughout the visit. Pt does not report any other concerns at the time of visit.  Pt reported that she is unemployed now due to worsening mental health and deciding to take leave. Pt reported that she is not stressing over finances. Pt reported that she has noticed a decrease in physical pain with a decrease in stress.  Pt reported that she wants to be able to work again and wants to utilize her work hiatus to work towards rebuilding physical and mental stamina. Pt engaged in conversation re: grief of old self. Pt engaged in self compassion discussion of  life being a mountain range with multiple peaks to remind pt that she will have multiple high points, not just one. Metaphor utilized to assist pt in navigating comparison and to encourage forward thinking. 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   Discussed time management skill and provided pt with a specialized to do list skill to assist pt in rebuilding mental stamina and improving task follow through. Informed pt that to complete the to do list, they must make a mass to do list at the beginning of the week and assign tasks to each day of the week being sure not to surpass more than 5 tasks a day (2 of which must be self care relevant). Pt is to create their own minimum using this to do list. If pt completes all 5 tasks for the day and feels energized to do more, they may complete a task for the next day. Pt should not replace that task on the following day's schedule until pt has engaged in mindfulness that following day after completing the remaining 4 tasks. This is to encourage listening to the body and to create room to assess stamina and reward pt with rest.   Pt utilized therapeutic space to process grief of childhood friend passing away. Pt reported feeling okay and also desiring space to grieve with someone else.   Pt reported that she wants to improve her self esteem. Pt shared that she knows she is pretty and also does not feel pretty. Pt shared that she felt pretty 8-9 years ago and that her past relationship changed her view of self. Provided pt with self love activities to incorporate into to do list  activity to begin working towards seeing self as she is. Discussed utilizing gratitude for body and practiced acceptance of body as it currently is. Provided education re: importance of acceptance at beginning of healing.  Pt is continuing to apply interventions/techniques learned in session into daily life situations. Pt is currently on track to meet goals  utilizing interventions that are discussed in session. Treatment to continue as indicated. Personal growth and progress toward goals noted above.  LCSW provided mood monitoring and treatment progress review in the context of this episode of treatment. LCSW reviewed the pt's mood status since last session.   Continued Recommendations as followed: Self-care behaviors, positive social engagements, focusing on positive physical and emotional wellness, and focusing on life/work balance.    Suicidal/Homicidal: Nowithout intent/plan  Therapist Response:  Provided pt education re: acceptance. Discussed how to make acceptance accessible at all parts of pt's healing journey.    Approached pt with strengths based perspective to assist pt in exploring strengths in moments of feeling low.    LCSW practiced active listening to validate pt participation, build rapport, and create safe space for pt to feel heard as they are disclosing their thoughts and feelings.    LCSW utilized therapeutic conversation skills informed by CBT, DBT, and ACT to expose pt to multiple ways of thinking about healing and to provide pt to access to multiple interventions.  LCSW introduced pt to Acceptance and Commitment Therapy. Pt engaged in discussion on how to explore what they must accept in order to commit to what they have identified as important. Introduced pt to values directed goal exploration in order to identify goals of importance.    Introduced pt to Dialectical Behavior Therapy and the importance of acceptance and change. Discussed concept of radical acceptance and also assisted pt in learning barriers to engaging in radical acceptance in journey towards change. Discussed importance of leaning into the dialectic and taught pt "and also" statements to assist in engaging with the bothness of change.    LCSW introduced pt to action planning. Pt practiced naming long term goal and worked with LCSW to define measurable  "mile marker" goals that feel achievable to herself as she is now to assist her in pursuing her long term goal.    Plan: Return again in 2 weeks.  Diagnosis: PTSD (post-traumatic stress disorder)  Mild episode of recurrent major depressive disorder ([place])    07/16/2022    3:07 PM 06/04/2022    8:51 AM 05/21/2022    8:53 AM 05/09/2022    1:29 PM  GAD 7 : Generalized Anxiety Score  Nervous, Anxious, on Edge 0 0 0 1  Control/stop worrying 0 2 0 1  Worry too much - different things 1 0 0 1  Trouble relaxing 1 2 1 1   Restless 0 0 1 1  Easily annoyed or irritable 0 0 0 0  Afraid - awful might happen 1 2 0 0  Total GAD 7 Score 3 6 2 5   Anxiety Difficulty Somewhat difficult Somewhat difficult Somewhat difficult Somewhat difficult       07/16/2022    3:07 PM 06/04/2022    8:50 AM 05/21/2022    8:53 AM  Depression screen PHQ 2/9  Decreased Interest 1 1 1   Down, Depressed, Hopeless 1 2 1   PHQ - 2 Score 2 3 2   Altered sleeping 1 2 2   Tired, decreased energy 1 0 2  Change in appetite 0 0 1  Feeling bad or failure about yourself  0 0 0  Trouble concentrating 1 0 0  Moving slowly or fidgety/restless 0 0 0  Suicidal thoughts 0 0 0  PHQ-9 Score 5 5 7   Difficult doing work/chores Somewhat difficult Somewhat difficult Somewhat difficult     Collaboration of Care: Psychiatrist AEB Dr. Modesta Messing  Patient/Guardian was advised Release of Information must be obtained prior to any record release in order to collaborate their care with an outside provider. Patient/Guardian was advised if they have not already done so to contact the registration department to sign all necessary forms in order for [place] to release information regarding their care.   Consent: Patient/Guardian gives verbal consent for treatment and assignment of benefits for services provided during this visit. Patient/Guardian expressed understanding and agreed to proceed.   Blair Dolphin, LCSW 07/22/2022

## 2022-07-29 ENCOUNTER — Ambulatory Visit (INDEPENDENT_AMBULATORY_CARE_PROVIDER_SITE_OTHER): Payer: Medicaid Other | Admitting: Licensed Clinical Social Worker

## 2022-07-29 DIAGNOSIS — F4310 Post-traumatic stress disorder, unspecified: Secondary | ICD-10-CM

## 2022-07-29 DIAGNOSIS — F330 Major depressive disorder, recurrent, mild: Secondary | ICD-10-CM

## 2022-07-29 NOTE — Progress Notes (Signed)
THERAPIST PROGRESS NOTE  Session Time: 8:05AM-8:52AM  Participation Level: Active  Behavioral Response: Casual, Neat, and Well GroomedAlertEuthymic  Type of Therapy: Individual Therapy  Treatment Goals addressed:  Reduce overall frequency, intensity and duration of anxiety so that daily functioning is not impaired per pt self report 3 out of 5 sessions.   Reduce overall frequency, intensity and duration of depression so that daily functioning is not impaired per pt self report 3 out of 5 sessions documented.   Pt will explore coping skills and learn coping and grounding skills to reduce symptoms of PTSD and prepare to handle future stressful situations as evidenced by implementing coping skills per pt report 3 out of 5 documented sessions.   Stabilize anxiety level while increasing ability to function on a daily basis as reported by pt 3 out of 5 sessions.    ProgressTowards Goals: Progressing  Interventions: CBT, DBT, Motivational Interviewing, Strength-based, and Other: ACT  Summary: Erin [name] is a 32 y.o. female who presents with continued sxs of anxiety and depression related to a hx of trauma. Sxs endorsed including but not limited to depressed mood, difficulty sleeping, fatigue, difficulty concentrating, flashbacks, re-traumatizations, worry, difficulty controlling worry, and fearfulness. Pt oriented to person, place, and time. Pt denies SI/HI or A/V hallucinations. Pt was cooperative during visit and was engaged throughout the visit. Pt does not report any other concerns at the time of visit. This information has been reviewed and continues to be correct to pt experience.   Pt reported that she has had an increase in energy and reported benefiting from practicing to do list activity discussed in previous session. Pt reported that she has completed more tasks both productive and self care relevant since her previous appt with the help of her to do list activity.  Encouraged continued use of to do list.   Pt reported that not working is causing her to stress more and is yielding increased anxiety re: finances and productivity. Assisted pt in observing taking care of self and home to be her new full time job. Pt reported feeling hopeful that disability services will assist in managing financial stress over time. Provided pt validation that nervousness is normal when adjusting to new life experience. .   Pt reported having a hard day yesterday due to over exerting self with kids. Pt reported feeling responsible for her kids not having a father figure in their lives and reports going out of her way to do anything they could want with them. Invited pt to explore her role and invited pt to notice when she is filling gaps she did not create. Encouraged pt to focus on fulfilling her roles and provide her children space to formulate their own opinions about the gaps in their life. Discussed vulnerability to burn out that comes with filling others' roles. Invited pt to explore where others may have same values as pt but different approaches to fulfilling values. Practiced checking the facts to open mind up to why others are the way they are.   Provided pt space to process her difficult relationship with father figures in her own life and explore potential of projection of her experiences onto her kids. Invited pt to explore areas of abundance in her kids' lives, not just areas of lack.   LCSW provided mood monitoring and treatment progress review in the context of this episode of treatment. LCSW reviewed the pt's mood status since last session.   Pt is continuing to apply interventions/techniques learned  in session into daily life situations. Pt is currently on track to meet goals utilizing interventions that are discussed in session. Treatment to continue as indicated. Personal growth and progress toward goals noted above.  Continued Recommendations as followed: Self-care  behaviors, positive social engagements, focusing on positive physical and emotional wellness, and focusing on life/work balance.   Suicidal/Homicidal: Nowithout intent/plan  Therapist Response:  Provided pt education re: acceptance. Discussed how to make acceptance accessible at all parts of pt's healing journey.   Approached pt with strengths based perspective to assist pt in exploring strengths in moments of feeling low.   LCSW practiced active listening to validate pt participation, build rapport, and create safe space for pt to feel heard as they are disclosing their thoughts and feelings.   LCSW utilized therapeutic conversation skills informed by CBT, DBT, and ACT to expose pt to multiple ways of thinking about healing and to provide pt to access to multiple interventions.  Pt engaged in discussion on values directed goal exploration. Pt identified values in therapy and barriers to aligning with values. Pt explored ways to overcome barriers to values driven goal pursuit. Provided pt validation of normalcy of values changing as self changes. Invited client to reflect on how values grow and shift with self growth and shifts. Educated pt on cognitive dissonance that occurs with engaging in behaviors that contradict values.   Pt engaged in DBT exercise of checking the facts to work towards inviting logic into their emotional processes. Pt utilized checking the facts to assist them in discerning if they need to practice problem solving or opposite action in order to best proceed with their circumstances. Reminded pt of importance of checking both "logical" and "emotional" facts in order to work towards accepting all parts of cognition and self during stress management and problem solving.   LCSW introduced pt to action planning. Pt practiced naming long term goal and worked with LCSW to define measurable "mile marker" goals that feel achievable to herself as she is now to assist her in pursuing her  long term goal.   Plan: Return again in 2 weeks.  Diagnosis: PTSD (post-traumatic stress disorder)  Mild episode of recurrent major depressive disorder    07/29/2022    8:54 AM 07/16/2022    3:07 PM 06/04/2022    8:51 AM 05/21/2022    8:53 AM  GAD 7 : Generalized Anxiety Score  Nervous, Anxious, on Edge 1 0 0 0  Control/stop worrying 1 0 2 0  Worry too much - different things 0 1 0 0  Trouble relaxing 0 1 2 1   Restless 0 0 0 1  Easily annoyed or irritable 0 0 0 0  Afraid - awful might happen 1 1 2  0  Total GAD 7 Score 3 3 6 2   Anxiety Difficulty Not difficult at all Somewhat difficult Somewhat difficult Somewhat difficult       07/29/2022    8:54 AM 07/16/2022    3:07 PM 06/04/2022    8:50 AM  Depression screen PHQ 2/9  Decreased Interest 0 1 1  Down, Depressed, Hopeless 1 1 2   PHQ - 2 Score 1 2 3   Altered sleeping 1 1 2   Tired, decreased energy 0 1 0  Change in appetite 0 0 0  Feeling bad or failure about yourself  1 0 0  Trouble concentrating 0 1 0  Moving slowly or fidgety/restless 0 0 0  Suicidal thoughts 0 0 0  PHQ-9 Score 3 5 5  Difficult doing work/chores Not difficult at all Somewhat difficult Somewhat difficult     Collaboration of Care: Psychiatrist AEB Dr. Modesta Messing  Patient/Guardian was advised Release of Information must be obtained prior to any record release in order to collaborate their care with an outside provider. Patient/Guardian was advised if they have not already done so to contact the registration department to sign all necessary forms in order for [place] to release information regarding their care.   Consent: Patient/Guardian gives verbal consent for treatment and assignment of benefits for services provided during this visit. Patient/Guardian expressed understanding and agreed to proceed.   Blair Dolphin, LCSW 07/29/2022

## 2022-08-12 ENCOUNTER — Ambulatory Visit: Payer: Medicaid Other | Admitting: Licensed Clinical Social Worker
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
## 2022-08-20 ENCOUNTER — Ambulatory Visit (INDEPENDENT_AMBULATORY_CARE_PROVIDER_SITE_OTHER): Payer: Medicaid Other | Admitting: Psychiatry

## 2022-08-20 ENCOUNTER — Encounter: Payer: Self-pay | Admitting: Psychiatry

## 2022-08-20 VITALS — BP 125/88 | HR 97 | Temp 97.3°F | Ht 67.0 in | Wt 264.2 lb

## 2022-08-20 DIAGNOSIS — T50905A Adverse effect of unspecified drugs, medicaments and biological substances, initial encounter: Secondary | ICD-10-CM | POA: Diagnosis not present

## 2022-08-20 DIAGNOSIS — F330 Major depressive disorder, recurrent, mild: Secondary | ICD-10-CM

## 2022-08-20 DIAGNOSIS — R635 Abnormal weight gain: Secondary | ICD-10-CM

## 2022-08-20 DIAGNOSIS — F4310 Post-traumatic stress disorder, unspecified: Secondary | ICD-10-CM

## 2022-08-20 NOTE — Patient Instructions (Addendum)
Continue venlafaxine to 225 mg daily Continue Abilify 2 mg at night  Continue metformin 500 mg at night  Next appointment- 7/16 at 1 PM

## 2022-08-27 ENCOUNTER — Ambulatory Visit (INDEPENDENT_AMBULATORY_CARE_PROVIDER_SITE_OTHER): Payer: Medicaid Other | Admitting: Licensed Clinical Social Worker

## 2022-08-27 DIAGNOSIS — F330 Major depressive disorder, recurrent, mild: Secondary | ICD-10-CM

## 2022-08-27 DIAGNOSIS — F4310 Post-traumatic stress disorder, unspecified: Secondary | ICD-10-CM | POA: Diagnosis not present
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
## 2022-09-11 ENCOUNTER — Ambulatory Visit (INDEPENDENT_AMBULATORY_CARE_PROVIDER_SITE_OTHER): Payer: Medicaid Other | Admitting: Licensed Clinical Social Worker

## 2022-09-11 DIAGNOSIS — F330 Major depressive disorder, recurrent, mild: Secondary | ICD-10-CM | POA: Diagnosis not present

## 2022-09-11 DIAGNOSIS — F4310 Post-traumatic stress disorder, unspecified: Secondary | ICD-10-CM

## 2022-09-15 ENCOUNTER — Other Ambulatory Visit: Payer: Self-pay | Admitting: Psychiatry

## 2022-09-26 ENCOUNTER — Ambulatory Visit (INDEPENDENT_AMBULATORY_CARE_PROVIDER_SITE_OTHER): Payer: Medicaid Other | Admitting: Licensed Clinical Social Worker

## 2022-09-26 DIAGNOSIS — F331 Major depressive disorder, recurrent, moderate: Secondary | ICD-10-CM | POA: Diagnosis not present

## 2022-09-26 DIAGNOSIS — F4310 Post-traumatic stress disorder, unspecified: Secondary | ICD-10-CM

## 2022-09-26 NOTE — Progress Notes (Signed)
THERAPIST PROGRESS NOTE  Session Time: 8:05AM-8:40AM  Participation Level: Active  Behavioral Response: DisheveledDrowsyAnxious and Depressed  Type of Therapy: Individual Therapy  Treatment Goals addressed:  Reduce overall frequency, intensity and duration of depression so that daily functioning is not impaired per pt self report 3 out of 5 sessions documented.   Reduce overall frequency, intensity and duration of anxiety so that daily functioning is not impaired per pt self report 3 out of 5 sessions.   Pt will explore coping skills and learn coping and grounding skills to reduce symptoms of PTSD and prepare to handle future stressful situations as evidenced by implementing coping skills per pt report 3 out of 5 documented sessions.   ProgressTowards Goals: Progressing  Interventions: CBT, DBT, Strength-based, and Other: ACT  Virtual Visit via Video Note  I connected with Erin [name] on 09/26/22 at  8:00 AM EDT by a video enabled telemedicine application and verified that I am speaking with the correct person using two identifiers.  Location: Patient: located in pt home Provider: working remotely in [place], [place]   I discussed the limitations of evaluation and management by telemedicine and the availability of in person appointments. The patient expressed understanding and agreed to proceed.  I discussed the assessment and treatment plan with the patient. The patient was provided an opportunity to ask questions and all were answered. The patient agreed with the plan and demonstrated an understanding of the instructions.   The patient was advised to call back or seek an in-person evaluation if the symptoms worsen or if the condition fails to improve as anticipated.  I provided 35 minutes of non-face-to-face time during this encounter.   Geoffry Paradise, LCSW  Summary: Erin [name] is a 32 y.o. female who presents with mixed sxs of anxiety and  depression related to a hx of trauma and experiences with current stressors. Sxs endorsed including but not limited to lack of motivation, lack of interest, fatigue, irritability, and negative self affect. Pt oriented to person, place, and time. Pt denies A/V hallucinations. Pt was cooperative during visit and was engaged throughout the visit. Pt does not report any other concerns at the time of visit.  Pt reported mood being impacted by cycle. Pt reported that she is endorsing worse sxs than normal for her. Encouraged pt to discuss medical concerns with her PCP should sxs continue beyond typical period length.   Pt reported that she was denied unemployment and reported increased depressive sxs as a result. Pt reported that she is waiting to hear if she has been approved for disability. Pt reported endorsing financial insecurity. Reviewed resources available to pt in area. Invited pt to consult with DSS for additional support.   Pt reported endorsing passive SI. Provided education re: SI and its goal to assist pt with escaping stressors. Discussed how SI does not consider all it is helping one escape. Pt reported no desire to hurt self. Pt named motivators for moving forward. Practiced action planning to assist pt in practicing opposite action.    Pt reported improvements in practicing DBT distress tolerance skill of opposite action when facing debilitating sxs. Pt identified how they discerned need to utilize opposite action and explored ways they can and have practiced opposite action to assist in combating depressive sxs.   Pt is continuing to apply interventions/techniques learned in session into daily life situations. Pt is currently on track to meet goals utilizing interventions that are discussed in session. Treatment to continue as  indicated. Personal growth and progress toward goals noted above.  LCSW provided mood monitoring and treatment progress review in the context of this episode of  treatment. LCSW reviewed the pt's mood status since last session.   Continued Recommendations as followed: Self-care behaviors, positive social engagements, focusing on positive physical and emotional wellness, and focusing on life/work balance.    Suicidal/Homicidal: Yeswithout intent/plan  Therapist Response:  Provided pt education re: acceptance. Discussed how to make acceptance accessible at all parts of pt's healing journey.   Approached pt with strengths based perspective to assist pt in exploring strengths in moments of feeling low.   LCSW practiced active listening to validate pt participation, build rapport, and create safe space for pt to feel heard as they are disclosing their thoughts and feelings.   LCSW utilized therapeutic conversation skills informed by CBT, DBT, and ACT to expose pt to multiple ways of thinking about healing and to provide pt to access to multiple interventions.  Introduced pt to Dialectical Behavior Therapy and the importance of acceptance and change. Discussed concept of radical acceptance and also assisted pt in learning barriers to engaging in radical acceptance in journey towards change. Discussed importance of leaning into the dialectic and taught pt "and also" statements to assist in engaging with the bothness of change.   Plan: Return again in 2 weeks.  Diagnosis: PTSD (post-traumatic stress disorder)  MDD (major depressive disorder), recurrent episode, moderate (HCC)    08/20/2022    3:36 PM 07/29/2022    8:54 AM 07/16/2022    3:07 PM 06/04/2022    8:51 AM  GAD 7 : Generalized Anxiety Score  Nervous, Anxious, on Edge 1 1 0 0  Control/stop worrying 2 1 0 2  Worry too much - different things 1 0 1 0  Trouble relaxing 1 0 1 2  Restless 0 0 0 0  Easily annoyed or irritable 1 0 0 0  Afraid - awful might happen 1 1 1 2   Total GAD 7 Score 7 3 3 6   Anxiety Difficulty Somewhat difficult Not difficult at all Somewhat difficult Somewhat difficult        08/20/2022    3:35 PM 07/29/2022    8:54 AM 07/16/2022    3:07 PM  Depression screen PHQ 2/9  Decreased Interest 0 0 1  Down, Depressed, Hopeless 1 1 1   PHQ - 2 Score 1 1 2   Altered sleeping 1 1 1   Tired, decreased energy 0 0 1  Change in appetite 0 0 0  Feeling bad or failure about yourself  1 1 0  Trouble concentrating 0 0 1  Moving slowly or fidgety/restless 0 0 0  Suicidal thoughts 0 0 0  PHQ-9 Score 3 3 5   Difficult doing work/chores Somewhat difficult Not difficult at all Somewhat difficult    Collaboration of Care: Psychiatrist AEB Dr. Vanetta Shawl  Patient/Guardian was advised Release of Information must be obtained prior to any record release in order to collaborate their care with an outside provider. Patient/Guardian was advised if they have not already done so to contact the registration department to sign all necessary forms in order for [place] to release information regarding their care.   Consent: Patient/Guardian gives verbal consent for treatment and assignment of benefits for services provided during this visit. Patient/Guardian expressed understanding and agreed to proceed.   Geoffry Paradise, LCSW 09/26/2022

## 2022-10-09 ENCOUNTER — Ambulatory Visit: Payer: Medicaid Other | Admitting: Licensed Clinical Social Worker

## 2022-10-23 ENCOUNTER — Ambulatory Visit (INDEPENDENT_AMBULATORY_CARE_PROVIDER_SITE_OTHER): Payer: Medicaid Other | Admitting: Licensed Clinical Social Worker

## 2022-10-23 DIAGNOSIS — F331 Major depressive disorder, recurrent, moderate: Secondary | ICD-10-CM | POA: Diagnosis not present

## 2022-10-23 DIAGNOSIS — F4310 Post-traumatic stress disorder, unspecified: Secondary | ICD-10-CM
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
## 2022-11-10 ENCOUNTER — Ambulatory Visit: Payer: Medicaid Other

## 2022-11-11 ENCOUNTER — Ambulatory Visit: Payer: Medicaid Other | Admitting: Licensed Clinical Social Worker

## 2022-11-12 ENCOUNTER — Ambulatory Visit: Payer: Medicaid Other | Admitting: Psychiatry

## 2022-11-13 ENCOUNTER — Ambulatory Visit: Payer: Medicaid Other | Admitting: Licensed Clinical Social Worker

## 2022-11-19 ENCOUNTER — Encounter: Payer: Self-pay | Admitting: Psychiatry

## 2022-11-19 ENCOUNTER — Ambulatory Visit (INDEPENDENT_AMBULATORY_CARE_PROVIDER_SITE_OTHER): Payer: Medicaid Other | Admitting: Psychiatry

## 2022-11-19 VITALS — BP 136/89 | HR 97 | Temp 97.0°F | Ht 67.0 in | Wt 257.0 lb

## 2022-11-19 DIAGNOSIS — F4310 Post-traumatic stress disorder, unspecified: Secondary | ICD-10-CM | POA: Diagnosis not present

## 2022-11-19 DIAGNOSIS — R635 Abnormal weight gain: Secondary | ICD-10-CM | POA: Diagnosis not present

## 2022-11-19 DIAGNOSIS — F331 Major depressive disorder, recurrent, moderate: Secondary | ICD-10-CM

## 2022-11-19 DIAGNOSIS — T50905A Adverse effect of unspecified drugs, medicaments and biological substances, initial encounter: Secondary | ICD-10-CM

## 2022-11-19 MED ORDER — ARIPIPRAZOLE 5 MG PO TABS: 5.0000 mg | ORAL_TABLET | Freq: Every day | ORAL | 0 refills | Status: DC

## 2022-11-19 MED ORDER — METFORMIN HCL 500 MG PO TABS: 500.0000 mg | ORAL_TABLET | Freq: Every day | ORAL | 1 refills | Status: DC

## 2022-11-21 ENCOUNTER — Ambulatory Visit: Payer: Medicaid Other | Admitting: Family

## 2022-11-21 ENCOUNTER — Encounter: Payer: Self-pay | Admitting: Family

## 2022-11-21 VITALS — BP 122/90 | HR 111 | Ht 67.0 in | Wt 254.0 lb

## 2022-11-21 DIAGNOSIS — F418 Other specified anxiety disorders: Secondary | ICD-10-CM

## 2022-11-21 DIAGNOSIS — I10 Essential (primary) hypertension: Secondary | ICD-10-CM | POA: Diagnosis not present

## 2022-11-21 DIAGNOSIS — E538 Deficiency of other specified B group vitamins: Secondary | ICD-10-CM

## 2022-11-21 DIAGNOSIS — M1990 Unspecified osteoarthritis, unspecified site: Secondary | ICD-10-CM

## 2022-11-21 DIAGNOSIS — E559 Vitamin D deficiency, unspecified: Secondary | ICD-10-CM | POA: Diagnosis not present

## 2022-11-21 DIAGNOSIS — R5383 Other fatigue: Secondary | ICD-10-CM

## 2022-11-21 DIAGNOSIS — R7303 Prediabetes: Secondary | ICD-10-CM | POA: Diagnosis not present

## 2022-11-21 DIAGNOSIS — M797 Fibromyalgia: Secondary | ICD-10-CM

## 2022-11-21 DIAGNOSIS — E782 Mixed hyperlipidemia: Secondary | ICD-10-CM

## 2022-11-21 NOTE — Progress Notes (Signed)
New Patient Office Visit  Subjective    Patient ID: Erin [name], female    DOB: 1990-08-26  Age: 32 y.o. MRN: [id number]  CC:  Chief Complaint  Patient presents with   Establish Care    NPE    HPI Lynia Lasser presents to establish care Previous Primary Care provider/office: Kandyce Rud   she does have additional concerns to discuss today.  She says that she Is concerned about her weight.  She has been gaining and would like to try to do something to help with her attempts to lose weight.   Outpatient Encounter Medications as of 11/21/2022  Medication Sig   Adalimumab (HUMIRA) 40 MG/0.4ML PSKT Inject 40 mg into the skin.   Alpha-Lipoic Acid 100 MG CAPS Take by mouth.   ARIPiprazole (ABILIFY) 5 MG tablet Take 1 tablet (5 mg total) by mouth at bedtime.   baclofen (LIORESAL) 10 MG tablet Take 10 mg by mouth 3 (three) times daily. 1 tablet at bedtime   Cholecalciferol (VITAMIN D-3) 125 MCG (5000 UT) TABS Take by mouth daily.   folic acid (FOLVITE) 1 MG tablet Take 1 mg by mouth daily.   meloxicam (MOBIC) 15 MG tablet Take 15 mg by mouth daily.   metFORMIN (GLUCOPHAGE) 500 MG tablet Take 1 tablet (500 mg total) by mouth at bedtime.   methotrexate (RHEUMATREX) 2.5 MG tablet Take 2.5 mg by mouth once a week. Caution:Chemotherapy. Protect from light.   omeprazole (PRILOSEC) 20 MG capsule Take 1 tablet by mouth daily.   phentermine 37.5 MG capsule Take 37.5 mg by mouth daily.   pregabalin (LYRICA) 200 MG capsule Take 200 mg by mouth 2 (two) times daily.   rizatriptan (MAXALT-MLT) 10 MG disintegrating tablet Take 1 tablet (10 mg total) by mouth as needed for migraine. May repeat in 2 hours if needed   [START ON 01/06/2023] Semaglutide-Weight Management 0.5 MG/0.5ML SOAJ Inject 0.5 mg into the skin once a week for 28 days.   [START ON 02/04/2023] Semaglutide-Weight Management 1 MG/0.5ML SOAJ Inject 1 mg into the skin once a week for 28 days.   [START ON 03/05/2023]  Semaglutide-Weight Management 1.7 MG/0.75ML SOAJ Inject 1.7 mg into the skin once a week for 28 days.   [START ON 04/03/2023] Semaglutide-Weight Management 2.4 MG/0.75ML SOAJ Inject 2.4 mg into the skin once a week.   venlafaxine XR (EFFEXOR-XR) 150 MG 24 hr capsule Take 1 capsule (150 mg total) by mouth daily. Total of 225 mg daily. Take along with 75 mg cap   venlafaxine XR (EFFEXOR-XR) 75 MG 24 hr capsule Take 1 capsule (75 mg total) by mouth daily with breakfast. Take total of 225 mg daily. Take along with 150 mg cap   albuterol (VENTOLIN HFA) 108 (90 Base) MCG/ACT inhaler Inhale 2 puffs into the lungs every 6 (six) hours as needed for wheezing or shortness of breath. (Patient not taking: Reported on 11/21/2022)   [DISCONTINUED] tiZANidine (ZANAFLEX) 4 MG tablet Take 4 mg by mouth 3 (three) times daily. (Patient not taking: Reported on 11/21/2022)   No facility-administered encounter medications on file as of 11/21/2022.    Past Medical History:  Diagnosis Date   Acute cholecystitis 09/03/2021   ADD (attention deficit disorder)    ADHD   Allergic genetic state    Amniotic fluid leaking 08/12/2015   Anxiety    Asthma    Chronic back pain    Depression    postpartum depression in past   Essential hypertension 05/29/2022  Family history of Down syndrome 03/16/2015   Fatigue    GERD (gastroesophageal reflux disease)    Headache    Hyperemesis affecting pregnancy, antepartum 03/10/2015   Nausea and vomiting in pregnancy 07/05/2015   Numbness and tingling    Pelvic pain in female 12/08/2022   Post-operative state 09/08/2015   Seasonal allergies    Seasonal allergies    Seizures (HCC)    during childhood   Viral respiratory illness 03/10/2015    Past Surgical History:  Procedure Laterality Date   CESAREAN SECTION  04/29/2012   CESAREAN SECTION WITH BILATERAL TUBAL LIGATION Bilateral 09/08/2015   Procedure: CESAREAN SECTION WITH BILATERAL TUBAL LIGATION;  Surgeon: Suzy Bouchard, MD;  Location: [facility] ORS;  Service: Obstetrics;  Laterality: Bilateral;   CHOLECYSTECTOMY     ESOPHAGOGASTRODUODENOSCOPY N/A 10/17/2021   Procedure: ESOPHAGOGASTRODUODENOSCOPY (EGD);  Surgeon: Toledo, Boykin Nearing, MD;  Location: [facility] ENDOSCOPY;  Service: Gastroenterology;  Laterality: N/A;   TONSILLECTOMY     TUBAL LIGATION      Family History  Problem Relation Age of Onset   Anxiety disorder Mother    Depression Mother    Hypertension Mother    Arthritis Mother    Cancer Mother    Diabetes Mother    Migraines Mother    Cancer Father    Depression Maternal Uncle    Suicidality Maternal Uncle    Bipolar disorder Maternal Grandfather    Diabetes Maternal Grandmother     Social History   Socioeconomic History   Marital status: Single    Spouse name: Not on file   Number of children: 2   Years of education: Not on file   Highest education level: Some college, no degree  Occupational History   Not on file  Tobacco Use   Smoking status: Former    Current packs/day: 0.10    Types: Cigarettes   Smokeless tobacco: Never  Vaping Use   Vaping status: Never Used  Substance and Sexual Activity   Alcohol use: Yes    Comment: occasional    Drug use: Not Currently    Types: Marijuana    Comment: tussionex cough syrup x 1 week 03-16-15   Sexual activity: Yes    Partners: Male    Birth control/protection: None, Surgical  Other Topics Concern   Not on file  Social History Narrative   Lives at home with her children   Right handed   Caffeine: maybe 3 cups a week   Social Determinants of Health   Financial Resource Strain: Low Risk  (07/27/2020)   Received from [facility], [facility]   Overall Financial Resource Strain (CARDIA)    Difficulty of Paying Living Expenses: Not hard at all  Food Insecurity: Food Insecurity Present (07/28/2020)   Received from [facility], [facility]   Hunger Vital  Sign    Worried About Running Out of Food in the Last Year: Sometimes true    Ran Out of Food in the Last Year: Sometimes true  Transportation Needs: No Transportation Needs (07/27/2020)   Received from [facility], [facility]   PRAPARE - Transportation    In the past 12 months, has lack of transportation kept you from medical appointments or from getting medications?: No    Lack of Transportation (Non-Medical): No  Physical Activity: Inactive (07/27/2020)   Received from [facility], [facility]   Exercise Vital Sign  Days of Exercise per Week: 0 days    Minutes of Exercise per Session: 0 min  Stress: No Stress Concern Present (07/27/2020)   Received from [facility], [facility]   [employer] of Occupational Health - Occupational Stress Questionnaire    Feeling of Stress : Not at all  Social Connections: Unknown (07/27/2020)   Received from [facility], [facility]   Social Connection and Isolation Panel [NHANES]    Frequency of Communication with Friends and Family: More than three times a week    Frequency of Social Gatherings with Friends and Family: Once a week    Attends Religious Services: More than 4 times per year    Active Member of [employer] or Organizations: No    Attends Banker Meetings: Never    Marital Status: Not on file  Intimate Partner Violence: Not on file    Review of Systems  Eyes:  Positive for redness.  Musculoskeletal:  Positive for myalgias.  All other systems reviewed and are negative.       Objective    BP (!) 122/90   Pulse (!) 111   Ht 5\' 7"  (1.702 m)   Wt 254 lb (115.2 kg)   SpO2 96%   BMI 39.78 kg/m   Physical Exam Vitals and nursing note reviewed.  Constitutional:      Appearance: Normal appearance. She is obese.  HENT:     Head: Normocephalic.  Eyes:     Extraocular  Movements: Extraocular movements intact.     Conjunctiva/sclera: Conjunctivae normal.     Pupils: Pupils are equal, round, and reactive to light.  Cardiovascular:     Rate and Rhythm: Normal rate.  Pulmonary:     Effort: Pulmonary effort is normal.  Neurological:     General: No focal deficit present.     Mental Status: She is alert and oriented to person, place, and time. Mental status is at baseline.  Psychiatric:        Mood and Affect: Mood normal.        Behavior: Behavior normal.        Thought Content: Thought content normal.        Judgment: Judgment normal.        Assessment & Plan:   Problem List Items Addressed This Visit       Active Problems   Fibromyalgia    Patient is seen by rheumatology, who manage this condition.  She is well controlled with current therapy.   Will defer to them for further changes to plan of care.       Relevant Medications   baclofen (LIORESAL) 10 MG tablet   Other Relevant Orders   CBC With Differential (Completed)   CMP14+EGFR (Completed)   Sedimentation rate (Completed)   CRP (C-Reactive Protein) (Completed)   Seronegative spondyloarthropathy   Essential hypertension    Blood pressure well controlled with current medications.  Continue current therapy.  Will reassess at follow up.       Morbid obesity (HCC)    Starting pt on Wegovy today. She has been given samples for the first month.  Will send continuing Rx's so she can continue.       Relevant Medications   phentermine 37.5 MG capsule   Semaglutide-Weight Management 0.5 MG/0.5ML SOAJ (Start on 01/06/2023)   Semaglutide-Weight Management 1 MG/0.5ML SOAJ (Start on 02/04/2023)   Semaglutide-Weight Management 1.7 MG/0.75ML SOAJ (Start on 03/05/2023)   Semaglutide-Weight  Management 2.4 MG/0.75ML SOAJ (Start on 04/03/2023)   Mixed anxiety and depressive disorder    Patient stable.  Well controlled with current therapy.   Continue current meds.       Other Visit  Diagnoses     Vitamin D deficiency, unspecified    -  Primary   Checking labs today.  Will continue supplements as needed.   Relevant Orders   VITAMIN D 25 Hydroxy (Vit-D Deficiency, Fractures) (Completed)   CBC With Differential (Completed)   CMP14+EGFR (Completed)   Other fatigue       Relevant Orders   CBC With Differential (Completed)   CMP14+EGFR (Completed)   TSH (Completed)   Prediabetes       Checking labs today Patient counseled on dietary choices and verbalized understanding.   Relevant Orders   CBC With Differential (Completed)   CMP14+EGFR (Completed)   Hemoglobin A1c (Completed)   Mixed hyperlipidemia       Checking labs today.  Continue current therapy for lipid control. Will modify as needed based on labwork results.   Relevant Orders   Lipid panel (Completed)   CBC With Differential (Completed)   CMP14+EGFR (Completed)   B12 deficiency due to diet       Checking labs today.  Will continue supplements as needed.   Relevant Orders   CBC With Differential (Completed)   CMP14+EGFR (Completed)   Vitamin B12 (Completed)   Essential hypertension, benign       Continue current therapy.  Will reassess at follow up.   Relevant Orders   CBC With Differential (Completed)   CMP14+EGFR (Completed)       Return in about 2 weeks (around 12/05/2022) for F/U.   Total time spent: 30 minutes  Miki Kins, FNP  11/21/2022  This document may have been prepared by [facility] Voice Recognition software and as such may include unintentional dictation errors.

## 2022-11-27 ENCOUNTER — Ambulatory Visit: Payer: Medicaid Other | Admitting: Licensed Clinical Social Worker

## 2022-12-03 ENCOUNTER — Encounter: Payer: Self-pay | Admitting: Family

## 2022-12-08 ENCOUNTER — Encounter: Payer: Self-pay | Admitting: Family

## 2022-12-08 DIAGNOSIS — R102 Pelvic and perineal pain: Secondary | ICD-10-CM

## 2022-12-08 DIAGNOSIS — R87619 Unspecified abnormal cytological findings in specimens from cervix uteri: Secondary | ICD-10-CM | POA: Insufficient documentation

## 2022-12-08 DIAGNOSIS — J45909 Unspecified asthma, uncomplicated: Secondary | ICD-10-CM | POA: Insufficient documentation

## 2022-12-08 HISTORY — DX: Pelvic and perineal pain: R10.2

## 2022-12-08 MED ORDER — SEMAGLUTIDE-WEIGHT MANAGEMENT 2.4 MG/0.75ML ~~LOC~~ SOAJ: 2.4000 mg | SUBCUTANEOUS | 1 refills | Status: AC

## 2022-12-08 MED ORDER — SEMAGLUTIDE-WEIGHT MANAGEMENT 1 MG/0.5ML ~~LOC~~ SOAJ: 1.0000 mg | SUBCUTANEOUS | 0 refills | Status: AC

## 2022-12-08 MED ORDER — SEMAGLUTIDE-WEIGHT MANAGEMENT 1.7 MG/0.75ML ~~LOC~~ SOAJ: 1.7000 mg | SUBCUTANEOUS | 0 refills | Status: AC

## 2022-12-08 MED ORDER — SEMAGLUTIDE-WEIGHT MANAGEMENT 0.5 MG/0.5ML ~~LOC~~ SOAJ: 0.5000 mg | SUBCUTANEOUS | 0 refills | Status: AC

## 2022-12-08 NOTE — Assessment & Plan Note (Deleted)
Starting pt on Wegovy today. She has been given samples for the first month.  Will send continuing Rx's so she can continue.

## 2022-12-08 NOTE — Assessment & Plan Note (Signed)
Patient stable.  Well controlled with current therapy.   Continue current meds.  

## 2022-12-08 NOTE — Assessment & Plan Note (Signed)
Blood pressure well controlled with current medications.  Continue current therapy.  Will reassess at follow up.  

## 2022-12-08 NOTE — Assessment & Plan Note (Signed)
Starting pt on Wegovy today. She has been given samples for the first month.  Will send continuing Rx's so she can continue.

## 2022-12-08 NOTE — Assessment & Plan Note (Signed)
Patient is seen by rheumatology, who manage this condition.  She is well controlled with current therapy.   Will defer to them for further changes to plan of care.

## 2022-12-10 ENCOUNTER — Encounter: Payer: Self-pay | Admitting: Family

## 2022-12-10 ENCOUNTER — Ambulatory Visit (INDEPENDENT_AMBULATORY_CARE_PROVIDER_SITE_OTHER): Payer: Medicaid Other | Admitting: Family

## 2022-12-10 VITALS — BP 114/86 | HR 91 | Ht 67.0 in | Wt 252.6 lb

## 2022-12-10 DIAGNOSIS — R5383 Other fatigue: Secondary | ICD-10-CM | POA: Diagnosis not present

## 2022-12-10 DIAGNOSIS — M797 Fibromyalgia: Secondary | ICD-10-CM

## 2022-12-10 DIAGNOSIS — F418 Other specified anxiety disorders: Secondary | ICD-10-CM

## 2022-12-10 DIAGNOSIS — I10 Essential (primary) hypertension: Secondary | ICD-10-CM | POA: Diagnosis not present

## 2022-12-10 MED ORDER — FAMOTIDINE 20 MG PO TABS: 20.0000 mg | ORAL_TABLET | Freq: Two times a day (BID) | ORAL | 1 refills | Status: DC

## 2022-12-10 MED ORDER — IBUPROFEN 800 MG PO TABS: 800.0000 mg | ORAL_TABLET | Freq: Three times a day (TID) | ORAL | 1 refills | Status: DC | PRN

## 2022-12-10 MED ORDER — VENLAFAXINE HCL ER 75 MG PO CP24: 75.0000 mg | ORAL_CAPSULE | Freq: Every day | ORAL | 1 refills | Status: DC

## 2022-12-10 MED ORDER — VENLAFAXINE HCL ER 150 MG PO CP24: 150.0000 mg | ORAL_CAPSULE | Freq: Every day | ORAL | 1 refills | Status: DC

## 2022-12-10 NOTE — Assessment & Plan Note (Signed)
Patient stable.  Well controlled with current therapy.   Continue current meds.  

## 2022-12-10 NOTE — Progress Notes (Signed)
Established Patient Office Visit  Subjective:  Patient ID: Erin [name], female    DOB: 12/19/1990  Age: 32 y.o. MRN: [id number]  Chief Complaint  Patient presents with   Follow-up    Follow Up    Pt. Here today for 2 week n/p follow up.   Had labs done at that time, so we will review in detail today.  Labs: Triglycerides are slightly high, Vitamin D a bit low.  Otherwise labs look good.   Patient is having additional stiffness. Says that she is taking the meloxicam but does not feel that this is helping.  She has taken some 800mg  ibuprofen and says that this has been beneficial.   Needs refill on her Venlafaxine, says she's been completely out of it, and has definitely been feeling much worse since then.  No other concerns at this time.   Past Medical History:  Diagnosis Date   Acute cholecystitis 09/03/2021   ADD (attention deficit disorder)    ADHD   Allergic genetic state    Amniotic fluid leaking 08/12/2015   Anxiety    Asthma    Chronic back pain    Depression    postpartum depression in past   Essential hypertension 05/29/2022   Family history of Down syndrome 03/16/2015   Fatigue    GERD (gastroesophageal reflux disease)    Headache    Hyperemesis affecting pregnancy, antepartum 03/10/2015   Nausea and vomiting in pregnancy 07/05/2015   Numbness and tingling    Pelvic pain in female 12/08/2022   Post-operative state 09/08/2015   Seasonal allergies    Seasonal allergies    Seizures (HCC)    during childhood   Viral respiratory illness 03/10/2015    Past Surgical History:  Procedure Laterality Date   CESAREAN SECTION  04/29/2012   CESAREAN SECTION WITH BILATERAL TUBAL LIGATION Bilateral 09/08/2015   Procedure: CESAREAN SECTION WITH BILATERAL TUBAL LIGATION;  Surgeon: Suzy Bouchard, MD;  Location: [facility] ORS;  Service: Obstetrics;  Laterality: Bilateral;   CHOLECYSTECTOMY     ESOPHAGOGASTRODUODENOSCOPY N/A 10/17/2021   Procedure:  ESOPHAGOGASTRODUODENOSCOPY (EGD);  Surgeon: Toledo, Boykin Nearing, MD;  Location: [facility] ENDOSCOPY;  Service: Gastroenterology;  Laterality: N/A;   TONSILLECTOMY     TUBAL LIGATION      Social History   Socioeconomic History   Marital status: Single    Spouse name: Not on file   Number of children: 2   Years of education: Not on file   Highest education level: Some college, no degree  Occupational History   Not on file  Tobacco Use   Smoking status: Former    Current packs/day: 0.10    Types: Cigarettes   Smokeless tobacco: Never  Vaping Use   Vaping status: Never Used  Substance and Sexual Activity   Alcohol use: Yes    Comment: occasional    Drug use: Not Currently    Types: Marijuana    Comment: tussionex cough syrup x 1 week 03-16-15   Sexual activity: Yes    Partners: Male    Birth control/protection: None, Surgical  Other Topics Concern   Not on file  Social History Narrative   Lives at home with her children   Right handed   Caffeine: maybe 3 cups a week   Social Determinants of Health   Financial Resource Strain: Low Risk  (07/27/2020)   Received from [facility], [facility]   Overall Financial Resource Strain (CARDIA)    Difficulty  of Paying Living Expenses: Not hard at all  Food Insecurity: Food Insecurity Present (07/28/2020)   Received from [facility], [facility]   Hunger Vital Sign    Worried About Running Out of Food in the Last Year: Sometimes true    Ran Out of Food in the Last Year: Sometimes true  Transportation Needs: No Transportation Needs (07/27/2020)   Received from [facility], [facility]   [facility] - Transportation    In the past 12 months, has lack of transportation kept you from medical appointments or from getting medications?: No    Lack of Transportation (Non-Medical): No  Physical Activity: Inactive (07/27/2020)   Received from [facility], [facility]   Exercise Vital Sign    Days of Exercise per Week: 0 days    Minutes of Exercise per Session: 0 min  Stress: No Stress Concern Present (07/27/2020)   Received from [facility], [facility]   [employer] of Occupational Health - Occupational Stress Questionnaire    Feeling of Stress : Not at all  Social Connections: Unknown (07/27/2020)   Received from [facility], [facility]   Social Connection and Isolation Panel [NHANES]    Frequency of Communication with Friends and Family: More than three times a week    Frequency of Social Gatherings with Friends and Family: Once a week    Attends Religious Services: More than 4 times per year    Active Member of [employer] or Organizations: No    Attends Engineer, structural: Never    Marital Status: Not on file  Intimate Partner Violence: Not on file    Family History  Problem Relation Age of Onset   Anxiety disorder Mother    Depression Mother    Hypertension Mother    Arthritis Mother    Cancer Mother    Diabetes Mother    Migraines Mother    Cancer Father    Depression Maternal Uncle    Suicidality Maternal Uncle    Bipolar disorder Maternal Grandfather    Diabetes Maternal Grandmother     Allergies  Allergen Reactions   Sulfa Antibiotics Hives   Sulfa Drugs Cross Reactors Other (See Comments)    Uncontrollable body shaking.    Review of Systems  Constitutional:  Positive for malaise/fatigue.  Musculoskeletal:  Positive for joint pain and myalgias.  Neurological:  Positive for dizziness, weakness and headaches.  All other systems reviewed and are negative.      Objective:   BP 114/86   Pulse 91   Ht 5\' 7"  (1.702 m)   Wt 252 lb 9.6 oz (114.6 kg)   SpO2 97%   BMI 39.56 kg/m   Vitals:   12/10/22 0919  BP: 114/86  Pulse: 91  Height: 5\' 7"  (1.702 m)  Weight: 252 lb 9.6 oz  (114.6 kg)  SpO2: 97%  BMI (Calculated): 39.55    Physical Exam Vitals and nursing note reviewed.  Constitutional:      Appearance: Normal appearance. She is normal weight.  HENT:     Head: Normocephalic.  Eyes:     Extraocular Movements: Extraocular movements intact.     Conjunctiva/sclera: Conjunctivae normal.     Pupils: Pupils are equal, round, and reactive to light.  Cardiovascular:     Rate and Rhythm: Normal rate.  Pulmonary:     Effort: Pulmonary effort is normal.  Neurological:     General: No focal deficit present.     Mental Status: She is alert and oriented to person, place, and time. Mental status is at baseline.  Psychiatric:        Mood and Affect: Mood normal.        Behavior: Behavior normal.        Thought Content: Thought content normal.        Judgment: Judgment normal.    No results found for any visits on 12/10/22.  Recent Results (from the past 2160 hour(s))  Lipid panel     Status: Abnormal   Collection Time: 11/21/22  3:18 PM  Result Value Ref Range   Cholesterol, Total 173 100 - 199 mg/dL   Triglycerides 161 (H) 0 - 149 mg/dL   HDL 49 >09 mg/dL   VLDL Cholesterol Cal 32 5 - 40 mg/dL   LDL Chol Calc (NIH) 92 0 - 99 mg/dL   Chol/HDL Ratio 3.5 0.0 - 4.4 ratio    Comment:                                   T. Chol/HDL Ratio                                             Men  Women                               1/2 Avg.Risk  3.4    3.3                                   Avg.Risk  5.0    4.4                                2X Avg.Risk  9.6    7.1                                3X Avg.Risk 23.4   11.0   VITAMIN D 25 Hydroxy (Vit-D Deficiency, Fractures)     Status: Abnormal   Collection Time: 11/21/22  3:18 PM  Result Value Ref Range   Vit D, 25-Hydroxy 27.8 (L) 30.0 - 100.0 ng/mL    Comment: Vitamin D deficiency has been defined by the Institute of Medicine and an Endocrine Society practice guideline as a level of serum 25-OH vitamin D less than 20  ng/mL (1,2). The Endocrine Society went on to further define vitamin D insufficiency as a level between 21 and 29 ng/mL (2). 1. IOM (Institute of Medicine). 2010. Dietary reference    intakes for calcium and D. [place]: The    [employer]. 2. Holick MF, [place]ink[place], Bischoff-Ferrari HA, et al.    Evaluation, treatment, and prevention of vitamin D    deficiency: an Endocrine Society clinical practice    guideline. JCEM. 2011 Jul; 96(7):1911-30.   CBC With Differential     Status: None   Collection Time: 11/21/22  3:18 PM  Result Value Ref Range   WBC 8.6 3.4 -  10.8 x10E3/uL   RBC 4.54 3.77 - 5.28 x10E6/uL   Hemoglobin 13.1 11.1 - 15.9 g/dL   Hematocrit 34.7 42.5 - 46.6 %   MCV 87 79 - 97 fL   MCH 28.9 26.6 - 33.0 pg   MCHC 33.2 31.5 - 35.7 g/dL   RDW 95.6 38.7 - 56.4 %   Neutrophils 68 Not Estab. %   Lymphs 25 Not Estab. %   Monocytes 6 Not Estab. %   Eos 1 Not Estab. %   Basos 0 Not Estab. %   Neutrophils Absolute 5.8 1.4 - 7.0 x10E3/uL   Lymphocytes Absolute 2.1 0.7 - 3.1 x10E3/uL   Monocytes Absolute 0.6 0.1 - 0.9 x10E3/uL   EOS (ABSOLUTE) 0.1 0.0 - 0.4 x10E3/uL   Basophils Absolute 0.0 0.0 - 0.2 x10E3/uL   Immature Granulocytes 0 Not Estab. %   Immature Grans (Abs) 0.0 0.0 - 0.1 x10E3/uL    Comment: **Effective November 25, 2022, profile [id number] CBC/Differential**   (No Platelet) will be made non-orderable. [facility] Offers:   [id number] CBC With Differential/Platelet   CMP14+EGFR     Status: None   Collection Time: 11/21/22  3:18 PM  Result Value Ref Range   Glucose 85 70 - 99 mg/dL   BUN 10 6 - 20 mg/dL   Creatinine, Ser 8.84 0.57 - 1.00 mg/dL   eGFR 166 >06 TK/ZSW/1.09   BUN/Creatinine Ratio 13 9 - 23   Sodium 139 134 - 144 mmol/L   Potassium 4.3 3.5 - 5.2 mmol/L   Chloride 101 96 - 106 mmol/L   CO2 25 20 - 29 mmol/L   Calcium 9.4 8.7 - 10.2 mg/dL   Total Protein 7.7 6.0 - 8.5 g/dL   Albumin 4.3 3.9 - 4.9 g/dL   Globulin, Total 3.4 1.5 - 4.5 g/dL    Bilirubin Total 0.4 0.0 - 1.2 mg/dL   Alkaline Phosphatase 106 44 - 121 IU/L   AST 22 0 - 40 IU/L   ALT 19 0 - 32 IU/L  TSH     Status: None   Collection Time: 11/21/22  3:18 PM  Result Value Ref Range   TSH 0.562 0.450 - 4.500 uIU/mL  Hemoglobin A1c     Status: None   Collection Time: 11/21/22  3:18 PM  Result Value Ref Range   Hgb A1c MFr Bld 5.2 4.8 - 5.6 %    Comment:          Prediabetes: 5.7 - 6.4          Diabetes: >6.4          Glycemic control for adults with diabetes: <7.0    Est. average glucose Bld gHb Est-mCnc 103 mg/dL  Vitamin N23     Status: None   Collection Time: 11/21/22  3:18 PM  Result Value Ref Range   Vitamin B-12 1,030 232 - 1,245 pg/mL  Sedimentation rate     Status: None   Collection Time: 11/21/22  3:18 PM  Result Value Ref Range   Sed Rate 31 0 - 32 mm/hr  CRP (C-Reactive Protein)     Status: Abnormal   Collection Time: 11/21/22  3:18 PM  Result Value Ref Range   CRP 16 (H) 0 - 10 mg/L       Assessment & Plan:   Problem List Items Addressed This Visit       Active Problems   Fibromyalgia    Patient stable.  Well controlled with current therapy.   Continue current meds.  Relevant Medications   venlafaxine XR (EFFEXOR-XR) 150 MG 24 hr capsule   venlafaxine XR (EFFEXOR-XR) 75 MG 24 hr capsule   ibuprofen (ADVIL) 800 MG tablet   Essential hypertension    Blood pressure well controlled with current medications.  Continue current therapy.  Will reassess at follow up.       Morbid obesity (HCC)    Patient started on wegovy at last appointment. She has started to lose some weight, will recheck at follow up.          Mixed anxiety and depressive disorder   Relevant Medications   venlafaxine XR (EFFEXOR-XR) 150 MG 24 hr capsule   venlafaxine XR (EFFEXOR-XR) 75 MG 24 hr capsule   Other fatigue - Primary    Checking iron labs today.  Will decide follow up based on results.       Relevant Orders   Iron, TIBC and Ferritin Panel     Return to be determined after labs..   Total time spent: 20 minutes  Miki Kins, FNP  12/10/2022   This document may have been prepared by [facility] Voice Recognition software and as such may include unintentional dictation errors.

## 2022-12-10 NOTE — Assessment & Plan Note (Signed)
Checking iron labs today.  Will decide follow up based on results.

## 2022-12-10 NOTE — Assessment & Plan Note (Signed)
Patient started on wegovy at last appointment. She has started to lose some weight, will recheck at follow up.

## 2022-12-10 NOTE — Assessment & Plan Note (Signed)
Blood pressure well controlled with current medications.  Continue current therapy.  Will reassess at follow up.  

## 2022-12-16 ENCOUNTER — Other Ambulatory Visit: Payer: Self-pay | Admitting: Family

## 2022-12-16 DIAGNOSIS — Z0283 Encounter for blood-alcohol and blood-drug test: Secondary | ICD-10-CM

## 2022-12-16 MED ORDER — ACCRUFER 30 MG PO CAPS: 30.0000 mg | ORAL_CAPSULE | Freq: Two times a day (BID) | ORAL | 3 refills | Status: DC

## 2022-12-20 LAB — TOXASSURE SELECT 16, UR
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
## 2023-01-01 ENCOUNTER — Ambulatory Visit (INDEPENDENT_AMBULATORY_CARE_PROVIDER_SITE_OTHER): Payer: Medicaid Other | Admitting: Cardiology

## 2023-01-01 ENCOUNTER — Encounter: Payer: Self-pay | Admitting: Cardiology

## 2023-01-01 VITALS — BP 122/76 | HR 82 | Ht 67.0 in | Wt 249.2 lb

## 2023-01-01 DIAGNOSIS — B029 Zoster without complications: Secondary | ICD-10-CM | POA: Insufficient documentation

## 2023-01-01 MED ORDER — VALACYCLOVIR HCL 1 G PO TABS: 1000.0000 mg | ORAL_TABLET | Freq: Two times a day (BID) | ORAL | 1 refills | Status: DC

## 2023-01-01 MED ORDER — GABAPENTIN 100 MG PO CAPS: 100.0000 mg | ORAL_CAPSULE | Freq: Three times a day (TID) | ORAL | 2 refills | Status: DC

## 2023-01-01 NOTE — Progress Notes (Signed)
Established Patient Office Visit  Subjective:  Patient ID: Erin [name], female    DOB: October 16, 1990  Age: 32 y.o. MRN: [id number]  Chief Complaint  Patient presents with   Acute Visit    BURNING in left leg    Patient in office complaining of burning in left leg. Patient states "burning" started 2 days ago. Feeling starts at her hip and goes down left leg to her thigh. Denies rash. Will start gabapentin for pain, Valtrex for possible shingles.     No other concerns at this time.   Past Medical History:  Diagnosis Date   Acute cholecystitis 09/03/2021   ADD (attention deficit disorder)    ADHD   Allergic genetic state    Amniotic fluid leaking 08/12/2015   Anxiety    Asthma    Chronic back pain    Depression    postpartum depression in past   Essential hypertension 05/29/2022   Family history of Down syndrome 03/16/2015   Fatigue    GERD (gastroesophageal reflux disease)    Headache    Hyperemesis affecting pregnancy, antepartum 03/10/2015   Nausea and vomiting in pregnancy 07/05/2015   Numbness and tingling    Pelvic pain in female 12/08/2022   Post-operative state 09/08/2015   Seasonal allergies    Seasonal allergies    Seizures (HCC)    during childhood   Viral respiratory illness 03/10/2015    Past Surgical History:  Procedure Laterality Date   CESAREAN SECTION  04/29/2012   CESAREAN SECTION WITH BILATERAL TUBAL LIGATION Bilateral 09/08/2015   Procedure: CESAREAN SECTION WITH BILATERAL TUBAL LIGATION;  Surgeon: Suzy Bouchard, MD;  Location: [facility] ORS;  Service: Obstetrics;  Laterality: Bilateral;   CHOLECYSTECTOMY     ESOPHAGOGASTRODUODENOSCOPY N/A 10/17/2021   Procedure: ESOPHAGOGASTRODUODENOSCOPY (EGD);  Surgeon: Toledo, Boykin Nearing, MD;  Location: [facility] END[facility];  Service: Gastroenterology;  Laterality: N/A;   TONSILLECTOMY     TUBAL LIGATION      Social History   Socioeconomic History   Marital status: Single    Spouse name: Not on  file   Number of children: 2   Years of education: Not on file   Highest education level: Some college, no degree  Occupational History   Not on file  Tobacco Use   Smoking status: Former    Current packs/day: 0.10    Types: Cigarettes   Smokeless tobacco: Never  Vaping Use   Vaping status: Never Used  Substance and Sexual Activity   Alcohol use: Yes    Comment: occasional    Drug use: Not Currently    Types: Marijuana    Comment: tussionex cough syrup x 1 week 03-16-15   Sexual activity: Yes    Partners: Male    Birth control/protection: None, Surgical  Other Topics Concern   Not on file  Social History Narrative   Lives at home with her children   Right handed   Caffeine: maybe 3 cups a week   Social Determinants of Health   Financial Resource Strain: Low Risk  (07/27/2020)   Received from [facility], [facility]   Overall Financial Resource Strain (CARDIA)    Difficulty of Paying Living Expenses: Not hard at all  Food Insecurity: Food Insecurity Present (07/28/2020)   Received from [facility], [facility]   Hunger Vital Sign    Worried About Running Out of Food in the Last Year: Sometimes true    Ran Out of  Food in the Last Year: Sometimes true  Transportation Needs: No Transportation Needs (07/27/2020)   Received from [facility], [facility]   [facility] - Transportation    In the past 12 months, has lack of transportation kept you from medical appointments or from getting medications?: No    Lack of Transportation (Non-Medical): No  Physical Activity: Inactive (07/27/2020)   Received from [facility], [facility]   Exercise Vital Sign    Days of Exercise per Week: 0 days    Minutes of Exercise per Session: 0 min  Stress: No Stress Concern Present (07/27/2020)   Received from [facility], [facility]   [employer] [facility] Occupational [facility] - Occupational Stress Questionnaire    Feeling of Stress : Not at all  Social Connections: Unknown (07/27/2020)   Received from [facility], [facility]   Social Connection and Isolation Panel [NHANES]    Frequency of Communication with Friends and Family: More than three times a week    Frequency of Social Gatherings with Friends and Family: Once a week    Attends Religious Services: More than 4 times per year    Active Member of [employer] or Organizations: No    Attends Engineer, structural: Never    Marital Status: Not on file  Intimate Partner Violence: Not on file    Family History  Problem Relation Age of Onset   Anxiety disorder Mother    Depression Mother    Hypertension Mother    Arthritis Mother    Cancer Mother    Diabetes Mother    Migraines Mother    Cancer Father    Depression Maternal Uncle    Suicidality Maternal Uncle    Bipolar disorder Maternal Grandfather    Diabetes Maternal Grandmother     Allergies  Allergen Reactions   Sulfa Antibiotics Hives   Sulfa Drugs Cross Reactors Other (See Comments)    Uncontrollable body shaking.    Review of Systems  Constitutional: Negative.   HENT: Negative.    Eyes: Negative.   Respiratory: Negative.  Negative for shortness of breath.   Cardiovascular: Negative.  Negative for chest pain.  Gastrointestinal: Negative.  Negative for abdominal pain, constipation and diarrhea.  Genitourinary: Negative.   Musculoskeletal:  Negative for joint pain and myalgias.  Skin: Negative.   Neurological: Negative.  Negative for dizziness and headaches.  Endo/Heme/Allergies: Negative.   All other systems reviewed and are negative.      Objective:   BP 122/76   Pulse 82   Ht 5\' 7"  (1.702 m)   Wt 249 lb 3.2 oz (113 kg)   SpO2 99%   BMI 39.03 kg/m   Vitals:   01/01/23 1055  BP: 122/76  Pulse: 82  Height: 5\' 7"  (1.702 m)   Weight: 249 lb 3.2 oz (113 kg)  SpO2: 99%  BMI (Calculated): 39.02    Physical Exam Vitals and nursing note reviewed.  Constitutional:      Appearance: Normal appearance. She is normal weight.  HENT:     Head: Normocephalic and atraumatic.     Nose: Nose normal.     Mouth/Throat:     Mouth: Mucous membranes are moist.  Eyes:     Extraocular Movements: Extraocular movements intact.     Conjunctiva/sclera: Conjunctivae normal.     Pupils: Pupils are equal, round, and reactive to light.  Cardiovascular:  Rate and Rhythm: Normal rate and regular rhythm.     Pulses: Normal pulses.     Heart sounds: Normal heart sounds.  Pulmonary:     Effort: Pulmonary effort is normal.     Breath sounds: Normal breath sounds.  Abdominal:     General: Abdomen is flat. Bowel sounds are normal.     Palpations: Abdomen is soft.  Musculoskeletal:        General: Normal range of motion.     Cervical back: Normal range of motion.  Skin:    General: Skin is warm and dry.  Neurological:     General: No focal deficit present.     Mental Status: She is alert and oriented to person, place, and time.  Psychiatric:        Mood and Affect: Mood normal.        Behavior: Behavior normal.        Thought Content: Thought content normal.        Judgment: Judgment normal.      No results found for any visits on 01/01/23.  Recent Results (from the past 2160 hour(s))  Lipid panel     Status: Abnormal   Collection Time: 11/21/22  3:18 PM  Result Value Ref Range   Cholesterol, Total 173 100 - 199 mg/dL   Triglycerides 161 (H) 0 - 149 mg/dL   HDL 49 >09 mg/dL   VLDL Cholesterol Cal 32 5 - 40 mg/dL   LDL Chol Calc (NIH) 92 0 - 99 mg/dL   Chol/HDL Ratio 3.5 0.0 - 4.4 ratio    Comment:                                   T. Chol/HDL Ratio                                             Men  Women                               1/2 Avg.Risk  3.4    3.3                                   Avg.Risk  5.0    4.4                                 2X Avg.Risk  9.6    7.1                                3X Avg.Risk 23.4   11.0   VITAMIN D 25 Hydroxy (Vit-D Deficiency, Fractures)     Status: Abnormal   Collection Time: 11/21/22  3:18 PM  Result Value Ref Range   Vit D, 25-Hydroxy 27.8 (L) 30.0 - 100.0 ng/mL    Comment: Vitamin D deficiency has been defined by the Institute of Medicine and an Endocrine Society practice guideline as a level of serum 25-OH vitamin D less than 20 ng/mL (1,2). The Endocrine Society went on to further define vitamin D insufficiency as  a level between 21 and 29 ng/mL (2). 1. IOM (Institute of Medicine). 2010. Dietary reference    intakes for calcium and D. [place]: The    [employer]. 2. Holick MF, Binkley [place], Bischoff-Ferrari HA, et al.    Evaluation, treatment, and prevention of vitamin D    deficiency: an Endocrine Society clinical practice    guideline. JCEM. 2011 Jul; 96(7):1911-30.   CBC With Differential     Status: None   Collection Time: 11/21/22  3:18 PM  Result Value Ref Range   WBC 8.6 3.4 - 10.8 x10E3/uL   RBC 4.54 3.77 - 5.28 x10E6/uL   Hemoglobin 13.1 11.1 - 15.9 g/dL   Hematocrit 09.8 11.9 - 46.6 %   MCV 87 79 - 97 fL   MCH 28.9 26.6 - 33.0 pg   MCHC 33.2 31.5 - 35.7 g/dL   RDW 14.7 82.9 - 56.2 %   Neutrophils 68 Not Estab. %   Lymphs 25 Not Estab. %   Monocytes 6 Not Estab. %   Eos 1 Not Estab. %   Basos 0 Not Estab. %   Neutrophils Absolute 5.8 1.4 - 7.0 x10E3/uL   Lymphocytes Absolute 2.1 0.7 - 3.1 x10E3/uL   Monocytes Absolute 0.6 0.1 - 0.9 x10E3/uL   EOS (ABSOLUTE) 0.1 0.0 - 0.4 x10E3/uL   Basophils Absolute 0.0 0.0 - 0.2 x10E3/uL   Immature Granulocytes 0 Not Estab. %   Immature Grans (Abs) 0.0 0.0 - 0.1 x10E3/uL    Comment: **Effective November 25, 2022, profile [id number] CBC/Differential**   (No Platelet) will be made non-orderable. [facility] Offers:   [id number] CBC With Differential/Platelet   CMP14+EGFR     Status: None    Collection Time: 11/21/22  3:18 PM  Result Value Ref Range   Glucose 85 70 - 99 mg/dL   BUN 10 6 - 20 mg/dL   Creatinine, Ser 7.84 0.57 - 1.00 mg/dL   eGFR 696 >29 BM/WUX/3.24   BUN/Creatinine Ratio 13 9 - 23   Sodium 139 134 - 144 mmol/L   Potassium 4.3 3.5 - 5.2 mmol/L   Chloride 101 96 - 106 mmol/L   CO2 25 20 - 29 mmol/L   Calcium 9.4 8.7 - 10.2 mg/dL   Total Protein 7.7 6.0 - 8.5 g/dL   Albumin 4.3 3.9 - 4.9 g/dL   Globulin, Total 3.4 1.5 - 4.5 g/dL   Bilirubin Total 0.4 0.0 - 1.2 mg/dL   Alkaline Phosphatase 106 44 - 121 IU/L   AST 22 0 - 40 IU/L   ALT 19 0 - 32 IU/L  TSH     Status: None   Collection Time: 11/21/22  3:18 PM  Result Value Ref Range   TSH 0.562 0.450 - 4.500 uIU/mL  Hemoglobin A1c     Status: None   Collection Time: 11/21/22  3:18 PM  Result Value Ref Range   Hgb A1c MFr Bld 5.2 4.8 - 5.6 %    Comment:          Prediabetes: 5.7 - 6.4          Diabetes: >6.4          Glycemic control for adults with diabetes: <7.0    Est. average glucose Bld gHb Est-mCnc 103 mg/dL  Vitamin M01     Status: None   Collection Time: 11/21/22  3:18 PM  Result Value Ref Range   Vitamin B-12 1,030 232 - 1,245 pg/mL  Sedimentation rate     Status:  None   Collection Time: 11/21/22  3:18 PM  Result Value Ref Range   Sed Rate 31 0 - 32 mm/hr  CRP (C-Reactive Protein)     Status: Abnormal   Collection Time: 11/21/22  3:18 PM  Result Value Ref Range   CRP 16 (H) 0 - 10 mg/L  Iron, TIBC and Ferritin Panel     Status: Abnormal   Collection Time: 12/10/22  1:07 PM  Result Value Ref Range   Total Iron Binding Capacity 337 250 - 450 ug/dL   UIBC 161 096 - 045 ug/dL   Iron 31 27 - 409 ug/dL   Iron Saturation 9 (LL) 15 - 55 %   Ferritin 11 (L) 15 - 150 ng/mL  ToxASSURE Select 16, UR     Status: None   Collection Time: 12/16/22  3:39 PM  Result Value Ref Range   Summary Note     Comment: ==================================================================== ToxASSURE Select 16,  UR ==================================================================== Test                             Result       Flag       Units    NO DRUGS DETECTED. ==================================================================== Test                      Result    Flag   Units      Ref Range   Creatinine              231              mg/dL      >=81 ==================================================================== Declared Medications:  Medication list was not provided. ==================================================================== For clinical consultation, please call [phone]. ====================================================================       Assessment & Plan:  Gabapentin 100 mg three times a day Valtrex twice daily.  Problem List Items Addressed This Visit       Other   Herpes zoster without complication - Primary   Relevant Medications   valACYclovir (VALTREX) 1000 MG tablet    Return if symptoms worsen or fail to improve.   Total time spent: 25 minutes  [employer], NP  01/01/2023   This document may have been prepared by Dragon Voice Recognition software and as such may include unintentional dictation errors.
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
## 2023-01-07 ENCOUNTER — Ambulatory Visit (INDEPENDENT_AMBULATORY_CARE_PROVIDER_SITE_OTHER): Payer: Medicaid Other | Admitting: Psychiatry

## 2023-01-07 ENCOUNTER — Encounter: Payer: Self-pay | Admitting: Psychiatry

## 2023-01-07 VITALS — BP 124/86 | HR 97 | Temp 97.3°F | Ht 67.0 in | Wt 248.4 lb

## 2023-01-07 DIAGNOSIS — G4700 Insomnia, unspecified: Secondary | ICD-10-CM | POA: Diagnosis not present

## 2023-01-07 DIAGNOSIS — F4310 Post-traumatic stress disorder, unspecified: Secondary | ICD-10-CM

## 2023-01-07 DIAGNOSIS — F3341 Major depressive disorder, recurrent, in partial remission: Secondary | ICD-10-CM | POA: Diagnosis not present

## 2023-01-07 DIAGNOSIS — R635 Abnormal weight gain: Secondary | ICD-10-CM | POA: Diagnosis not present

## 2023-01-07 DIAGNOSIS — T50905A Adverse effect of unspecified drugs, medicaments and biological substances, initial encounter: Secondary | ICD-10-CM

## 2023-01-07 MED ORDER — ARIPIPRAZOLE 5 MG PO TABS: 5.0000 mg | ORAL_TABLET | Freq: Every day | ORAL | 0 refills | Status: DC

## 2023-01-07 MED ORDER — TRAZODONE HCL 50 MG PO TABS: 25.0000 mg | ORAL_TABLET | Freq: Every evening | ORAL | 1 refills | Status: DC | PRN

## 2023-01-07 NOTE — Progress Notes (Signed)
THERAPIST PROGRESS NOTE  Session Time: 8:08AM-8:58AM  Participation Level: Active  Behavioral Response: DisheveledDrowsyAnxious and Depressed  Type of Therapy: Individual Therapy  Treatment Goals addressed:  Reduce overall frequency, intensity and duration of depression so that daily functioning is not impaired per pt self report 3 out of 5 sessions documented.   Reduce overall frequency, intensity and duration of anxiety so that daily functioning is not impaired per pt self report 3 out of 5 sessions.   Pt will explore coping skills and learn coping and grounding skills to reduce symptoms of PTSD and prepare to handle future stressful situations as evidenced by implementing coping skills per pt report 3 out of 5 documented sessions.   ProgressTowards Goals: Progressing  Interventions: CBT, DBT, Strength-based, and Other: ACT  Summary: Erin [name] is a 32 y.o. female who presents with mixed sxs of anxiety and depression related to a hx of trauma and experiences with current stressors. Sxs endorsed including but not limited to lack of motivation, lack of interest, fatigue, irritability, and negative self affect. Pt oriented to person, place, and time. Pt denies A/V hallucinations. Pt was cooperative during visit and was engaged throughout the visit. Pt does not report any other concerns at the time of visit.  Pt reported mood being impacted by cycle. Pt reported that she is endorsing worse sxs than normal for her. Encouraged pt to discuss medical concerns with her PCP should sxs continue beyond typical period length.   Pt reported that she was denied unemployment and reported increased depressive sxs as a result. Pt reported that she is waiting to hear if she has been approved for disability. Pt reported endorsing financial insecurity. Reviewed resources available to pt in area. Invited pt to consult with DSS for additional support.   Pt reported endorsing passive SI.  Provided education re: SI and its goal to assist pt with escaping stressors. Discussed how SI does not consider all it is helping one escape. Pt reported no desire to hurt self. Pt named motivators for moving forward. Practiced action planning to assist pt in practicing opposite action.    Pt reported improvements in practicing DBT distress tolerance skill of opposite action when facing debilitating sxs. Pt identified how they discerned need to utilize opposite action and explored ways they can and have practiced opposite action to assist in combating depressive sxs.   Pt is continuing to apply interventions/techniques learned in session into daily life situations. Pt is currently on track to meet goals utilizing interventions that are discussed in session. Treatment to continue as indicated. Personal growth and progress toward goals noted above.  LCSW provided mood monitoring and treatment progress review in the context of this episode of treatment. LCSW reviewed the pt's mood status since last session.   Continued Recommendations as followed: Self-care behaviors, positive social engagements, focusing on positive physical and emotional wellness, and focusing on life/work balance.    Suicidal/Homicidal: Yeswithout intent/plan  Therapist Response:  Provided pt education re: acceptance. Discussed how to make acceptance accessible at all parts of pt's healing journey.   Approached pt with strengths based perspective to assist pt in exploring strengths in moments of feeling low.   LCSW practiced active listening to validate pt participation, build rapport, and create safe space for pt to feel heard as they are disclosing their thoughts and feelings.   LCSW utilized therapeutic conversation skills informed by CBT, DBT, and ACT to expose pt to multiple ways of thinking about healing and to provide  pt to access to multiple interventions.  Introduced pt to Dialectical Behavior Therapy and the  importance of acceptance and change. Discussed concept of radical acceptance and also assisted pt in learning barriers to engaging in radical acceptance in journey towards change. Discussed importance of leaning into the dialectic and taught pt "and also" statements to assist in engaging with the bothness of change.   Plan: Return again in 2 weeks.  Diagnosis: PTSD (post-traumatic stress disorder)  MDD (major depressive disorder), recurrent episode, moderate (HCC)    11/22/2022   10:38 AM 11/19/2022    8:34 AM 10/23/2022    1:03 PM 08/20/2022    3:36 PM  GAD 7 : Generalized Anxiety Score  Nervous, Anxious, on Edge 2 2 0 1  Control/stop worrying 2 2 0 2  Worry too much - different things 2 0 0 1  Trouble relaxing 1 2 0 1  Restless 0 0 0 0  Easily annoyed or irritable 2 3 0 1  Afraid - awful might happen 2 3 0 1  Total GAD 7 Score 11 12 0 7  Anxiety Difficulty Somewhat difficult Somewhat difficult Not difficult at all Somewhat difficult       11/22/2022   10:38 AM 11/19/2022    8:34 AM 10/23/2022    1:03 PM  Depression screen PHQ 2/9  Decreased Interest 1 0 0  Down, Depressed, Hopeless 2 1 0  PHQ - 2 Score 3 1 0  Altered sleeping 0    Tired, decreased energy 0    Change in appetite 0    Feeling bad or failure about yourself  2    Trouble concentrating 1    Moving slowly or fidgety/restless 0    Suicidal thoughts 0    PHQ-9 Score 6    Difficult doing work/chores Somewhat difficult      Collaboration of Care: Psychiatrist AEB Dr. Vanetta Shawl  Patient/Guardian was advised Release of Information must be obtained prior to any record release in order to collaborate their care with an outside provider. Patient/Guardian was advised if they have not already done so to contact the registration department to sign all necessary forms in order for [place] to release information regarding their care.   Consent: Patient/Guardian gives verbal consent for treatment and assignment of benefits for  services provided during this visit. Patient/Guardian expressed understanding and agreed to proceed.   [facility] Nataliya Graig, LCSW

## 2023-01-07 NOTE — Patient Instructions (Signed)
Continue venlafaxine to 225 mg daily Continue Abilify 5 mg at night  Continue metformin 500 mg at night  Start trazodone 25-50 mg at night as needed for insomnia Next appointment- 10/31 at 8 am

## 2023-01-17 ENCOUNTER — Other Ambulatory Visit: Payer: Self-pay | Admitting: Family

## 2023-01-17 MED ORDER — ONDANSETRON 4 MG PO TBDP: 4.0000 mg | ORAL_TABLET | Freq: Three times a day (TID) | ORAL | 0 refills | Status: AC | PRN

## 2023-01-21 ENCOUNTER — Encounter: Payer: Self-pay | Admitting: Family

## 2023-01-21 ENCOUNTER — Ambulatory Visit: Payer: Medicaid Other | Admitting: Family

## 2023-01-21 VITALS — BP 132/82 | HR 88 | Ht 67.0 in | Wt 246.0 lb

## 2023-01-21 DIAGNOSIS — R55 Syncope and collapse: Secondary | ICD-10-CM | POA: Diagnosis not present

## 2023-01-21 DIAGNOSIS — N390 Urinary tract infection, site not specified: Secondary | ICD-10-CM

## 2023-01-21 DIAGNOSIS — R002 Palpitations: Secondary | ICD-10-CM

## 2023-01-21 LAB — POCT URINALYSIS DIPSTICK
Blood, UA: NEGATIVE
Glucose, UA: NEGATIVE
Leukocytes, UA: NEGATIVE
Nitrite, UA: NEGATIVE
Protein, UA: POSITIVE — AB
Spec Grav, UA: >=1.03 — AB (ref 1.010–1.025)
Urobilinogen, UA: 0.2 E.U./dL
pH, UA: 6 (ref 5.0–8.0)

## 2023-01-23 ENCOUNTER — Ambulatory Visit: Payer: Medicaid Other | Admitting: Family

## 2023-01-24 ENCOUNTER — Ambulatory Visit (LOCAL_COMMUNITY_HEALTH_CENTER): Payer: Self-pay

## 2023-01-24 ENCOUNTER — Other Ambulatory Visit: Payer: Self-pay

## 2023-01-24 DIAGNOSIS — Z111 Encounter for screening for respiratory tuberculosis: Secondary | ICD-10-CM

## 2023-01-27 ENCOUNTER — Ambulatory Visit (LOCAL_COMMUNITY_HEALTH_CENTER): Payer: Medicaid Other

## 2023-01-27 DIAGNOSIS — R7611 Nonspecific reaction to tuberculin skin test without active tuberculosis: Secondary | ICD-10-CM | POA: Diagnosis not present

## 2023-01-27 DIAGNOSIS — Z111 Encounter for screening for respiratory tuberculosis: Secondary | ICD-10-CM

## 2023-01-27 LAB — TB SKIN TEST
Induration: 12 mm
TB Skin Test: POSITIVE

## 2023-01-27 NOTE — Progress Notes (Signed)
PPDR 12 mm Positive. Patient states ppd needed for [employer] nursing school. Reports arthritis, fibromyalgia, and small fiber neuropathy. Taking Humira and Methotrexate.  Phone consult with Dr Levonne Hubert and informed of patient status. Orders QFT today at no charge to patient.  RN discussed with patient and she is in agreement with QFT. ROI signed. RN walked patient to lab for QFT. Jerel Shepherd, RN

## 2023-01-27 NOTE — Progress Notes (Signed)
MD Attestation for TB RN: I agree with the care provided to this patient and the plan for follow up and treatment.  Avyan Livesay M. Tish Begin, MD  

## 2023-01-28 ENCOUNTER — Encounter: Payer: Self-pay | Admitting: Family

## 2023-01-28 ENCOUNTER — Ambulatory Visit (INDEPENDENT_AMBULATORY_CARE_PROVIDER_SITE_OTHER): Payer: Medicaid Other

## 2023-01-28 DIAGNOSIS — R7611 Nonspecific reaction to tuberculin skin test without active tuberculosis: Secondary | ICD-10-CM

## 2023-01-30 LAB — QUANTIFERON-TB GOLD PLUS
QuantiFERON Mitogen Value: >10 [IU]/mL
QuantiFERON Nil Value: 0.01 [IU]/mL
QuantiFERON TB1 Ag Value: 0.02 [IU]/mL
QuantiFERON TB2 Ag Value: 0.03 [IU]/mL
QuantiFERON-TB Gold Plus: NEGATIVE

## 2023-02-03 ENCOUNTER — Ambulatory Visit: Payer: Medicaid Other

## 2023-02-03 ENCOUNTER — Encounter: Payer: Self-pay | Admitting: Family

## 2023-02-03 DIAGNOSIS — R55 Syncope and collapse: Secondary | ICD-10-CM

## 2023-02-03 DIAGNOSIS — R002 Palpitations: Secondary | ICD-10-CM

## 2023-02-03 NOTE — Progress Notes (Signed)
Reviewed. QFT negative.  Phone call to patient and informed of results. Per Dr Levonne Hubert, recommend patient to have QFT test in place of PPD in the future.  Patient informed that [employer] can provide letter for school/work if needed.  Patient states she does not need any additional letter at this time. Questions answered and reports understanding. Jerel Shepherd, RN

## 2023-02-04 ENCOUNTER — Other Ambulatory Visit (INDEPENDENT_AMBULATORY_CARE_PROVIDER_SITE_OTHER): Payer: Medicaid Other

## 2023-02-04 ENCOUNTER — Ambulatory Visit: Payer: Medicaid Other

## 2023-02-04 DIAGNOSIS — Z23 Encounter for immunization: Secondary | ICD-10-CM

## 2023-02-12 ENCOUNTER — Encounter: Payer: Self-pay | Admitting: Family

## 2023-02-12 ENCOUNTER — Other Ambulatory Visit: Payer: Self-pay | Admitting: Family

## 2023-02-12 ENCOUNTER — Other Ambulatory Visit: Payer: Self-pay

## 2023-02-12 ENCOUNTER — Ambulatory Visit (INDEPENDENT_AMBULATORY_CARE_PROVIDER_SITE_OTHER): Payer: Medicaid Other | Admitting: Family

## 2023-02-12 VITALS — BP 119/90 | HR 97 | Ht 67.0 in | Wt 247.0 lb

## 2023-02-12 DIAGNOSIS — R42 Dizziness and giddiness: Secondary | ICD-10-CM | POA: Diagnosis not present

## 2023-02-12 DIAGNOSIS — R002 Palpitations: Secondary | ICD-10-CM

## 2023-02-12 DIAGNOSIS — R5383 Other fatigue: Secondary | ICD-10-CM

## 2023-02-12 MED ORDER — BLOOD GLUCOSE MONITOR KIT: PACK | 0 refills | Status: DC

## 2023-02-12 MED ORDER — ACCU-CHEK GUIDE W/DEVICE KIT
1.0000 | PACK | Freq: Every day | 0 refills | Status: AC
Start: 2023-02-12 — End: 2023-03-14

## 2023-02-17 ENCOUNTER — Other Ambulatory Visit: Payer: Self-pay | Admitting: Psychiatry

## 2023-02-17 DIAGNOSIS — R002 Palpitations: Secondary | ICD-10-CM | POA: Diagnosis not present

## 2023-02-17 DIAGNOSIS — R55 Syncope and collapse: Secondary | ICD-10-CM | POA: Diagnosis not present
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
## 2023-02-23 ENCOUNTER — Encounter: Payer: Self-pay | Admitting: Family

## 2023-02-23 NOTE — Progress Notes (Signed)
Acute Office Visit  Subjective:     Patient ID: Erin [name], female    DOB: [birth year], 32 y.o.   MRN: [id number]  Patient is in today for No chief complaint on file.   Palpitations  This is a new problem. The current episode started in the past 7 days. The problem occurs intermittently. The symptoms are aggravated by stress and unknown. Associated symptoms include dizziness and syncope. She has tried bed rest and breathing exercises for the symptoms. The treatment provided no relief. Risk factors include obesity and stress. Her past medical history is significant for anemia and anxiety.  Loss of Consciousness This is a new problem. The current episode started in the past 7 days. The problem occurs constantly. The problem has been waxing and waning. She lost consciousness for a period of less than 1 minute. The symptoms are aggravated by sitting up. Associated symptoms include dizziness and palpitations. She has tried position change and bed rest for the symptoms. The treatment provided no relief.     Review of Systems  Cardiovascular:  Positive for palpitations and syncope.  Neurological:  Positive for dizziness.  All other systems reviewed and are negative.       Objective:    BP 132/82   Pulse 88   Ht 5\' 7"  (1.702 m)   Wt 246 lb (111.6 kg)   SpO2 97%   BMI 38.53 kg/m   Physical Exam Vitals and nursing note reviewed.  Constitutional:      Appearance: Normal appearance. She is normal weight.  HENT:     Head: Normocephalic.  Eyes:     Pupils: Pupils are equal, round, and reactive to light.  Cardiovascular:     Rate and Rhythm: Normal rate.  Pulmonary:     Effort: Pulmonary effort is normal.     Breath sounds: Normal breath sounds.  Neurological:     General: No focal deficit present.     Mental Status: She is alert and oriented to person, place, and time.  Psychiatric:        Attention and Perception: Attention and perception normal.        Mood and  Affect: Mood is anxious.        Behavior: Behavior normal.        Thought Content: Thought content normal.        Judgment: Judgment normal.     Results for orders placed or performed in visit on 01/21/23  POCT Urinalysis Dipstick (81002)  Result Value Ref Range   Color, UA     Clarity, UA     Glucose, UA Negative Negative   Bilirubin, UA 1+    Ketones, UA +    Spec Grav, UA >=1.030 (A) 1.010 - 1.025   Blood, UA Negative    pH, UA 6.0 5.0 - 8.0   Protein, UA Positive (A) Negative   Urobilinogen, UA 0.2 0.2 or 1.0 E.U./dL   Nitrite, UA Negative    Leukocytes, UA Negative Negative   Appearance     Odor      Recent Results (from the past 2160 hour(s))  Iron, TIBC and Ferritin Panel     Status: Abnormal   Collection Time: 12/10/22  1:07 PM  Result Value Ref Range   Total Iron Binding Capacity 337 250 - 450 ug/dL   UIBC 409 811 - 914 ug/dL   Iron 31 27 - 782 ug/dL   Iron Saturation 9 (LL) 15 - 55 %   Ferritin  11 (L) 15 - 150 ng/mL  ToxASSURE Select 16, UR     Status: None   Collection Time: 12/16/22  3:39 PM  Result Value Ref Range   Summary Note     Comment: ==================================================================== ToxASSURE Select 16, UR ==================================================================== Test                             Result       Flag       Units    NO DRUGS DETECTED. ==================================================================== Test                      Result    Flag   Units      Ref Range   Creatinine              231              mg/dL      >=16 ==================================================================== Declared Medications:  Medication list was not provided. ==================================================================== For clinical consultation, please call [phone]. ====================================================================   POCT Urinalysis Dipstick (81191)     Status: Abnormal    Collection Time: 01/21/23  3:27 PM  Result Value Ref Range   Color, UA     Clarity, UA     Glucose, UA Negative Negative   Bilirubin, UA 1+    Ketones, UA +    Spec Grav, UA >=1.030 (A) 1.010 - 1.025   Blood, UA Negative    pH, UA 6.0 5.0 - 8.0   Protein, UA Positive (A) Negative   Urobilinogen, UA 0.2 0.2 or 1.0 E.U./dL   Nitrite, UA Negative    Leukocytes, UA Negative Negative   Appearance     Odor    TB Skin Test     Status: Abnormal   Collection Time: 01/27/23  5:09 PM  Result Value Ref Range   TB Skin Test Positive    Induration 12 mm  QuantiFERON-TB Gold Plus     Status: None   Collection Time: 01/27/23  5:24 PM  Result Value Ref Range   QuantiFERON Incubation Incubation performed.    QuantiFERON Criteria Comment     Comment: QuantiFERON-TB Gold Plus is a qualitative indirect test for M tuberculosis infection (including disease) and is intended for use in conjunction with risk assessment, radiography, and other medical and diagnostic evaluations. The QuantiFERON-TB Gold Plus result is determined by subtracting the Nil value from either TB antigen (Ag) value. The Mitogen tube serves as a control for the test.    QuantiFERON TB1 Ag Value 0.02 IU/mL   QuantiFERON TB2 Ag Value 0.03 IU/mL   QuantiFERON Nil Value 0.01 IU/mL   QuantiFERON Mitogen Value >10.00 IU/mL   QuantiFERON-TB Gold Plus Negative Negative    Comment: No response to M tuberculosis antigens detected. Infection with M tuberculosis is unlikely, but high risk individuals should be considered for additional testing (ATS/IDSA/CDC Clinical Practice Guidelines, 2017). The reference range is an Antigen minus Nil result of <0.35 IU/mL. Chemiluminescence immunoassay methodology        Assessment & Plan:   Problem List Items Addressed This Visit       Active Problems   Morbid obesity (HCC)   Other Visit Diagnoses     Palpitations    -  Primary   Relevant Orders   AMBULATORY REFERRAL FOR HOLTER  MONITORING   LONG TERM MONITOR (3-14 DAYS)   Syncope, unspecified syncope type  Relevant Orders   AMBULATORY REFERRAL FOR HOLTER MONITORING   LONG TERM MONITOR (3-14 DAYS)   POCT Urinalysis Dipstick (16109) (Completed)      Getting patient set up for a holter, as I am unsure what is causing the issues.    Return to be determined based on results..  Total time spent: 20 minutes  Miki Kins, FNP  01/21/2023   This document may have been prepared by [facility] Voice Recognition software and as such may include unintentional dictation errors.

## 2023-02-26 ENCOUNTER — Ambulatory Visit (INDEPENDENT_AMBULATORY_CARE_PROVIDER_SITE_OTHER): Payer: Medicaid Other | Admitting: Family

## 2023-02-26 ENCOUNTER — Encounter: Payer: Self-pay | Admitting: Family

## 2023-02-26 VITALS — BP 98/84 | HR 93 | Ht 68.0 in | Wt 245.0 lb

## 2023-02-26 DIAGNOSIS — R002 Palpitations: Secondary | ICD-10-CM | POA: Diagnosis not present

## 2023-02-26 DIAGNOSIS — Z0130 Encounter for examination of blood pressure without abnormal findings: Secondary | ICD-10-CM

## 2023-02-26 DIAGNOSIS — R42 Dizziness and giddiness: Secondary | ICD-10-CM | POA: Diagnosis not present

## 2023-02-26 DIAGNOSIS — E162 Hypoglycemia, unspecified: Secondary | ICD-10-CM

## 2023-02-27 ENCOUNTER — Ambulatory Visit (INDEPENDENT_AMBULATORY_CARE_PROVIDER_SITE_OTHER): Payer: Medicaid Other | Admitting: Psychiatry

## 2023-02-27 ENCOUNTER — Encounter: Payer: Self-pay | Admitting: Psychiatry

## 2023-02-27 ENCOUNTER — Other Ambulatory Visit: Payer: Self-pay

## 2023-02-27 VITALS — BP 151/86 | HR 98 | Temp 95.2°F | Ht 68.0 in | Wt 247.4 lb

## 2023-02-27 DIAGNOSIS — F3341 Major depressive disorder, recurrent, in partial remission: Secondary | ICD-10-CM | POA: Diagnosis not present

## 2023-02-27 DIAGNOSIS — F4310 Post-traumatic stress disorder, unspecified: Secondary | ICD-10-CM | POA: Diagnosis not present

## 2023-02-27 DIAGNOSIS — G4700 Insomnia, unspecified: Secondary | ICD-10-CM | POA: Diagnosis not present

## 2023-02-27 MED ORDER — DEXCOM G7 SENSOR MISC: 11 refills | Status: DC

## 2023-02-27 MED ORDER — TRAZODONE HCL 50 MG PO TABS: 25.0000 mg | ORAL_TABLET | Freq: Every evening | ORAL | 0 refills | Status: DC | PRN

## 2023-02-27 NOTE — Patient Instructions (Signed)
Continue venlafaxine 225 mg daily Continue Abilify 5 mg at night  Discontinue metformin  Continue trazodone 50 mg at night as needed for insomnia Next appointment- 12/31 at 8 am

## 2023-02-28 NOTE — Progress Notes (Signed)
Established Patient Office Visit  Subjective:  Patient ID: Starleen Comella, female    DOB: 01-16-1991  Age: 32 y.o. MRN: [id number]  Chief Complaint  Patient presents with   Follow-up    Follow up    Patient is here for follow up.  She has been wearing the Dexcom and finished her Zywie.   The Zywie did not show any significant correlation between her symptoms and the changes in the readings.  At most, she was having mild sinus tachycardia when the episodes occurred.  It did however show a severe drop in her heart rate (down to 36 at the lowest) while sleeping, possibly indicating sleep apnea.   She says she has been tested before and was not diagnosed with sleep apnea.   She has had some significant low blood sugars and has been symptomatic when she has been awake for them.     No other concerns at this time.   Past Medical History:  Diagnosis Date   Acute cholecystitis 09/03/2021   ADD (attention deficit disorder)    ADHD   Allergic genetic state    Amniotic fluid leaking 08/12/2015   Anxiety    Asthma    Chronic back pain    Depression    postpartum depression in past   Essential hypertension 05/29/2022   Family history of Down syndrome 03/16/2015   Fatigue    GERD (gastroesophageal reflux disease)    Headache    Hyperemesis affecting pregnancy, antepartum 03/10/2015   Nausea and vomiting in pregnancy 07/05/2015   Numbness and tingling    Pelvic pain in female 12/08/2022   Post-operative state 09/08/2015   Seasonal allergies    Seasonal allergies    Seizures (HCC)    during childhood   Viral respiratory illness 03/10/2015    Past Surgical History:  Procedure Laterality Date   CESAREAN SECTION  04/29/2012   CESAREAN SECTION WITH BILATERAL TUBAL LIGATION Bilateral 09/08/2015   Procedure: CESAREAN SECTION WITH BILATERAL TUBAL LIGATION;  Surgeon: Suzy Bouchard, MD;  Location: [facility] ORS;  Service: Obstetrics;  Laterality: Bilateral;    CHOLECYSTECTOMY     ESOPHAGOGASTRODUODENOSCOPY N/A 10/17/2021   Procedure: ESOPHAGOGASTRODUODENOSCOPY (EGD);  Surgeon: Toledo, Boykin Nearing, MD;  Location: [facility] END[facility];  Service: Gastroenterology;  Laterality: N/A;   TONSILLECTOMY     TUBAL LIGATION      Social History   Socioeconomic History   Marital status: Single    Spouse name: Not on file   Number of children: 2   Years of education: Not on file   Highest education level: Some college, no degree  Occupational History   Not on file  Tobacco Use   Smoking status: Former    Current packs/day: 0.10    Types: Cigarettes   Smokeless tobacco: Never  Vaping Use   Vaping status: Never Used  Substance and Sexual Activity   Alcohol use: Yes    Comment: occasional    Drug use: Not Currently    Types: Marijuana    Comment: tussionex cough syrup x 1 week 03-16-15   Sexual activity: Yes    Partners: Male    Birth control/protection: None, Surgical  Other Topics Concern   Not on file  Social History Narrative   Lives at home with her children   Right handed   Caffeine: maybe 3 cups a week   Social Drivers of Corporate investment banker Strain: Low Risk  (07/27/2020)   Received [facility] [place]  [facility]   Overall Financial Resource Strain (CARDIA)    Difficulty of Paying Living Expenses: Not hard at all  Food Insecurity: Food Insecurity Present (07/28/2020)   Received from [facility], [facility]   Hunger Vital Sign    Worried About Running Out of Food in the Last Year: Sometimes true    Ran Out of Food in the Last Year: Sometimes true  Transportation Needs: No Transportation Needs (07/27/2020)   Received from [facility], [facility]   [facility] - Transportation    In the past 12 months, has lack of transportation kept you from medical appointments or from getting medications?: No    Lack of Transportation  (Non-Medical): No  Physical Activity: Inactive (07/27/2020)   Received from [facility], [facility]   Exercise Vital Sign    Days of Exercise per Week: 0 days    Minutes of Exercise per Session: 0 min  Stress: No Stress Concern Present (07/27/2020)   Received from [facility], [facility]   [employer] of Occupational Health - Occupational Stress Questionnaire    Feeling of Stress : Not at all  Social Connections: Unknown (07/27/2020)   Received from [facility], [facility]   Social Connection and Isolation Panel [NHANES]    Frequency of Communication with Friends and Family: More than three times a week    Frequency of Social Gatherings with Friends and Family: Once a week    Attends Religious Services: More than 4 times per year    Active Member of [employer] or Organizations: No    Attends Engineer, structural: Never    Marital Status: Not on file  Intimate Partner Violence: Not on file    Family History  Problem Relation Age of Onset   Anxiety disorder Mother    Depression Mother    Hypertension Mother    Arthritis Mother    Cancer Mother    Diabetes Mother    Migraines Mother    Cancer Father    Depression Maternal Uncle    Suicidality Maternal Uncle    Bipolar disorder Maternal Grandfather    Diabetes Maternal Grandmother     Allergies  Allergen Reactions   Sulfa Antibiotics Hives   Sulfa Drugs Cross Reactors Other (See Comments)    Uncontrollable body shaking.    Review of Systems  Constitutional:  Positive for malaise/fatigue.  Neurological:  Positive for dizziness and weakness.  All other systems reviewed and are negative.     Objective:   BP 98/84   Pulse 93   Ht 5\' 8"  (1.727 m)   Wt 245 lb (111.1 kg)   LMP 01/28/2023   SpO2 98%   BMI 37.25 kg/m   Vitals:   02/26/23 1153  BP: 98/84  Pulse: 93  Height: 5\' 8"  (1.727 m)   Weight: 245 lb (111.1 kg)  SpO2: 98%  BMI (Calculated): 37.26    Physical Exam Vitals and nursing note reviewed.  Constitutional:      General: She is not in acute distress.    Appearance: Normal appearance. She is obese. She is not toxic-appearing.  HENT:     Head: Normocephalic.  Eyes:     Pupils: Pupils are equal, round, and reactive to light.  Cardiovascular:     Rate and Rhythm: Normal rate.  Pulmonary:     Effort: Pulmonary effort is normal.  Neurological:     General: No focal deficit present.     Mental Status: She is alert and oriented to person, place, and time. Mental status is at baseline.  Psychiatric:        Mood and Affect: Mood normal.        Behavior: Behavior normal.        Thought Content: Thought content normal.        Judgment: Judgment normal.      No results found for any visits on 02/26/23.  Recent Results (from the past 2160 hours)  TB Skin Test     Status: Abnormal   Collection Time: 01/27/23  5:09 PM  Result Value Ref Range   TB Skin Test Positive    Induration 12 mm  QuantiFERON-TB Gold Plus     Status: None   Collection Time: 01/27/23  5:24 PM  Result Value Ref Range   QuantiFERON Incubation Incubation performed.    QuantiFERON Criteria Comment     Comment: QuantiFERON-TB Gold Plus is a qualitative indirect test for M tuberculosis infection (including disease) and is intended for use in conjunction with risk assessment, radiography, and other medical and diagnostic evaluations. The QuantiFERON-TB Gold Plus result is determined by subtracting the Nil value from either TB antigen (Ag) value. The Mitogen tube serves as a control for the test.    QuantiFERON TB1 Ag Value 0.02 IU/mL   QuantiFERON TB2 Ag Value 0.03 IU/mL   QuantiFERON Nil Value 0.01 IU/mL   QuantiFERON Mitogen Value >10.00 IU/mL   QuantiFERON-TB Gold Plus Negative Negative    Comment: No response to M tuberculosis antigens detected. Infection with M tuberculosis is  unlikely, but high risk individuals should be considered for additional testing (ATS/IDSA/CDC Clinical Practice Guidelines, 2017). The reference range is an Antigen minus Nil result of <0.35 IU/mL. Chemiluminescence immunoassay methodology   POCT XPERT XPRESS SARS COVID-2/FLU/RSV     Status: None   Collection Time: 03/04/23  9:47 AM  Result Value Ref Range   SARS Coronavirus 2 neg    FLU A neg    FLU B neg    RSV RNA, PCR neg   Resp panel by RT-PCR (RSV, Flu A&B, Covid) Anterior Nasal Swab     Status: None   Collection Time: 04/04/23 12:15 AM   Specimen: Anterior Nasal Swab  Result Value Ref Range   SARS Coronavirus 2 by RT PCR NEGATIVE NEGATIVE    Comment: (NOTE) SARS-CoV-2 target nucleic acids are NOT DETECTED.  The SARS-CoV-2 RNA is generally detectable in upper respiratory specimens during the acute phase of infection. The lowest concentration of SARS-CoV-2 viral copies this assay can detect is 138 copies/mL. A negative result does not preclude SARS-Cov-2 infection and should not be used as the sole basis for treatment or other patient management decisions. A negative result may occur with  improper specimen collection/handling, submission of specimen other than nasopharyngeal swab, presence of viral mutation(s) within the areas targeted by this assay, and inadequate number of viral copies(<138 copies/mL). A negative result must be combined with clinical observations, patient history, and epidemiological information. The expected result is Negative.  Fact Sheet for Patients:  BloggerCourse.com  Fact Sheet for Healthcare Providers:  SeriousBroker.it  This test is no t yet approved or cleared by the [place] FDA and  has been authorized for detection and/or diagnosis of SARS-CoV-2 by FDA under an Emergency Use Authorization (EUA). This EUA will remain  in effect (meaning this test can be used) for  the duration of  the COVID-19 declaration under Section 564(b)(1) of the Act, 21 U.S.C.section 360bbb-3(b)(1), unless the authorization is terminated  or revoked sooner.       Influenza A by PCR NEGATIVE NEGATIVE   Influenza B by PCR NEGATIVE NEGATIVE    Comment: (NOTE) The Xpert Xpress SARS-CoV-2/FLU/RSV plus assay is intended as an aid in the diagnosis of influenza from Nasopharyngeal swab specimens and should not be used as a sole basis for treatment. Nasal washings and aspirates are unacceptable for Xpert Xpress SARS-CoV-2/FLU/RSV testing.  Fact Sheet for Patients: BloggerCourse.com  Fact Sheet for Healthcare Providers: SeriousBroker.it  This test is not yet approved or cleared by the [place] FDA and has been authorized for detection and/or diagnosis of SARS-CoV-2 by FDA under an Emergency Use Authorization (EUA). This EUA will remain in effect (meaning this test can be used) for the duration of the COVID-19 declaration under Section 564(b)(1) of the Act, 21 U.S.C. section 360bbb-3(b)(1), unless the authorization is terminated or revoked.     Resp Syncytial Virus by PCR NEGATIVE NEGATIVE    Comment: (NOTE) Fact Sheet for Patients: BloggerCourse.com  Fact Sheet for Healthcare Providers: SeriousBroker.it  This test is not yet approved or cleared by the [place] FDA and has been authorized for detection and/or diagnosis of SARS-CoV-2 by FDA under an Emergency Use Authorization (EUA). This EUA will remain in effect (meaning this test can be used) for the duration of the COVID-19 declaration under Section 564(b)(1) of the Act, 21 U.S.C. section 360bbb-3(b)(1), unless the authorization is terminated or revoked.  Performed at [facility], [address] [place], [place] [address]   CBC with Differential     Status: None   Collection Time: 04/04/23  7:48 PM  Result  Value Ref Range   WBC 4.8 4.0 - 10.5 K/uL   RBC 4.17 3.87 - 5.11 MIL/uL   Hemoglobin 12.3 12.0 - 15.0 g/dL   HCT 41.3 24.4 - 01.0 %   MCV 90.2 80.0 - 100.0 fL   MCH 29.5 26.0 - 34.0 pg   MCHC 32.7 30.0 - 36.0 g/dL   RDW 27.2 53.6 - 64.4 %   Platelets 301 150 - 400 K/uL   nRBC 0.0 0.0 - 0.2 %   Neutrophils Relative % 68 %   Neutro Abs 3.2 1.7 - 7.7 K/uL   Lymphocytes Relative 24 %   Lymphs Abs 1.2 0.7 - 4.0 K/uL   Monocytes Relative 7 %   Monocytes Absolute 0.3 0.1 - 1.0 K/uL   Eosinophils Relative 0 %   Eosinophils Absolute 0.0 0.0 - 0.5 K/uL   Basophils Relative 0 %   Basophils Absolute 0.0 0.0 - 0.1 K/uL   Immature Granulocytes 1 %   Abs Immature Granulocytes 0.07 0.00 - 0.07 K/uL    Comment: Performed at [facility], [address] [place], [place] [address]  Comprehensive metabolic panel     Status: Abnormal   Collection Time: 04/04/23  7:48 PM  Result Value Ref Range   Sodium 136 135 - 145 mmol/L   Potassium 3.9 3.5 - 5.1 mmol/L   Chloride 101 98 - 111 mmol/L   CO2 29 22 - 32 mmol/L   Glucose, Bld 118 (H) 70 - 99 mg/dL    Comment: Glucose reference range applies only to samples taken after fasting for at least 8 hours.   BUN 8 6 - 20 mg/dL   Creatinine, Ser 2.59 0.44 - 1.00 mg/dL   Calcium 8.6 (  L) 8.9 - 10.3 mg/dL   Total Protein 7.5 6.5 - 8.1 g/dL   Albumin 3.5 3.5 - 5.0 g/dL   AST 43 (H) 15 - 41 U/L   ALT 47 (H) 0 - 44 U/L   Alkaline Phosphatase 71 38 - 126 U/L   Total Bilirubin 0.5 <1.2 mg/dL   GFR, Estimated >91 >47 mL/min    Comment: (NOTE) Calculated using the CKD-EPI Creatinine Equation (2021)    Anion gap 6 5 - 15    Comment: Performed at [facility], [address] [place], [place] [address]  Urinalysis, Routine w reflex microscopic -Urine, Clean Catch     Status: Abnormal   Collection Time: 04/04/23  7:48 PM  Result Value Ref Range   Color, Urine STRAW (A) YELLOW   APPearance CLEAR (A) CLEAR   Specific Gravity, Urine 1.006  1.005 - 1.030   pH 8.0 5.0 - 8.0   Glucose, UA NEGATIVE NEGATIVE mg/dL   Hgb urine dipstick NEGATIVE NEGATIVE   Bilirubin Urine NEGATIVE NEGATIVE   Ketones, ur NEGATIVE NEGATIVE mg/dL   Protein, ur NEGATIVE NEGATIVE mg/dL   Nitrite NEGATIVE NEGATIVE   Leukocytes,Ua NEGATIVE NEGATIVE    Comment: Performed at [facility], [address] [place], [place] [address]  Troponin I (High Sensitivity)     Status: None   Collection Time: 04/04/23  7:48 PM  Result Value Ref Range   Troponin I (High Sensitivity) 2 <18 ng/L    Comment: (NOTE) Elevated high sensitivity troponin I (hsTnI) values and significant  changes across serial measurements may suggest ACS but many other  chronic and acute conditions are known to elevate hsTnI results.  Refer to the "Links" section for chest pain algorithms and additional  guidance. Performed at [facility], [address] [place], [place] [address]   TSH     Status: None   Collection Time: 04/04/23  7:48 PM  Result Value Ref Range   TSH 1.111 0.350 - 4.500 uIU/mL    Comment: Performed by a 3rd Generation assay with a functional sensitivity of <=0.01 uIU/mL. Performed at [facility], [address] [place], [place] [address]   Urine Drug Screen, Qualitative     Status: None   Collection Time: 04/04/23  7:48 PM  Result Value Ref Range   Tricyclic, Ur Screen NONE DETECTED NONE DETECTED   Amphetamines, Ur Screen NONE DETECTED NONE DETECTED   MDMA (Ecstasy)Ur Screen NONE DETECTED NONE DETECTED   Cocaine Metabolite,Ur [place] NONE DETECTED NONE DETECTED   Opiate, Ur Screen NONE DETECTED NONE DETECTED   Phencyclidine (PCP) Ur S NONE DETECTED NONE DETECTED   Cannabinoid 50 Ng, Ur [place] NONE DETECTED NONE DETECTED   Barbiturates, Ur Screen NONE DETECTED NONE DETECTED   Benzodiazepine, Ur Scrn NONE DETECTED NONE DETECTED   Methadone Scn, Ur NONE DETECTED NONE DETECTED    Comment: (NOTE) Tricyclics + metabolites, urine    Cutoff  1000 ng/mL Amphetamines + metabolites, urine  Cutoff 1000 ng/mL MDMA (Ecstasy), urine              Cutoff 500 ng/mL Cocaine Metabolite, urine          Cutoff 300 ng/mL Opiate + metabolites, urine        Cutoff 300 ng/mL Phencyclidine (PCP), urine         Cutoff 25 ng/mL Cannabinoid, urine                 Cutoff 50 ng/mL Barbiturates + metabolites,  urine  Cutoff 200 ng/mL Benzodiazepine, urine              Cutoff 200 ng/mL Methadone, urine                   Cutoff 300 ng/mL  The urine drug screen provides only a preliminary, unconfirmed analytical test result and should not be used for non-medical purposes. Clinical consideration and professional judgment should be applied to any positive drug screen result due to possible interfering substances. A more specific alternate chemical method must be used in order to obtain a confirmed analytical result. Gas chromatography / mass spectrometry (GC/MS) is the preferred confirm atory method. Performed at [facility], [address] [place], [place] [address]   POC Urine Pregnancy, ED     Status: None   Collection Time: 04/04/23  7:57 PM  Result Value Ref Range   Preg Test, Ur NEGATIVE NEGATIVE    Comment:        THE SENSITIVITY OF THIS METHODOLOGY IS >24 mIU/mL   Troponin I (High Sensitivity)     Status: None   Collection Time: 04/04/23 11:44 PM  Result Value Ref Range   Troponin I (High Sensitivity) 2 <18 ng/L    Comment: (NOTE) Elevated high sensitivity troponin I (hsTnI) values and significant  changes across serial measurements may suggest ACS but many other  chronic and acute conditions are known to elevate hsTnI results.  Refer to the "Links" section for chest pain algorithms and additional  guidance. Performed at [facility], [address] [place], [place] [address]        Assessment & Plan:   Problem List Items Addressed This Visit   None Visit Diagnoses       Dizziness    -  Primary      Hypoglycemia         Palpitations         Continue monitoring dexcom readings.  Will set up for Sleep Study. Awaiting zywie results.    Return to be determined based on results.   Total time spent: 20 minutes  Miki Kins, FNP  02/26/2023   This document may have been prepared by [facility] Voice Recognition software and as such may include unintentional dictation errors.

## 2023-03-04 ENCOUNTER — Ambulatory Visit (INDEPENDENT_AMBULATORY_CARE_PROVIDER_SITE_OTHER): Payer: Medicaid Other | Admitting: Family

## 2023-03-04 DIAGNOSIS — R6889 Other general symptoms and signs: Secondary | ICD-10-CM | POA: Diagnosis not present

## 2023-03-04 LAB — POCT XPERT XPRESS SARS COVID-2/FLU/RSV
FLU A: NEGATIVE
FLU B: NEGATIVE
RSV RNA, PCR: NEGATIVE
SARS Coronavirus 2: NEGATIVE

## 2023-03-04 NOTE — Progress Notes (Signed)
   Subjective   CHIEF COMPLAINT  COVID Testing    REASON FOR VISIT  COVID testing for URI symptoms.        Objective   Results for orders placed or performed in visit on 03/04/23  POCT XPERT XPRESS SARS COVID-2/FLU/RSV  Result Value Ref Range   SARS Coronavirus 2 neg    FLU A neg    FLU B neg    RSV RNA, PCR neg     Assessment & Plan  1. Flu-like symptoms - POCT XPERT XPRESS SARS COVID-2/FLU/RSV   Total time spent: 5 minutes  Miki Kins, FNP 03/04/2023

## 2023-03-05 ENCOUNTER — Telehealth: Payer: Self-pay | Admitting: Family

## 2023-03-05 NOTE — Telephone Encounter (Signed)
Patient left VM requesting another Dexcom sample because the one she was given yesterday is not pairing to her phone.

## 2023-03-12 ENCOUNTER — Ambulatory Visit: Payer: Medicaid Other | Admitting: Licensed Clinical Social Worker

## 2023-03-12 DIAGNOSIS — F3341 Major depressive disorder, recurrent, in partial remission: Secondary | ICD-10-CM | POA: Diagnosis not present

## 2023-03-12 DIAGNOSIS — F4310 Post-traumatic stress disorder, unspecified: Secondary | ICD-10-CM | POA: Diagnosis not present
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
## 2023-03-14 ENCOUNTER — Ambulatory Visit: Payer: Medicaid Other | Admitting: Family

## 2023-03-14 ENCOUNTER — Encounter: Payer: Self-pay | Admitting: Family

## 2023-03-14 VITALS — BP 128/90 | HR 87 | Ht 67.0 in | Wt 246.0 lb

## 2023-03-14 DIAGNOSIS — Z0000 Encounter for general adult medical examination without abnormal findings: Secondary | ICD-10-CM | POA: Diagnosis not present

## 2023-03-25 ENCOUNTER — Ambulatory Visit (INDEPENDENT_AMBULATORY_CARE_PROVIDER_SITE_OTHER): Payer: Medicaid Other | Admitting: Licensed Clinical Social Worker

## 2023-03-25 DIAGNOSIS — F4310 Post-traumatic stress disorder, unspecified: Secondary | ICD-10-CM | POA: Diagnosis not present

## 2023-03-25 DIAGNOSIS — F3341 Major depressive disorder, recurrent, in partial remission: Secondary | ICD-10-CM | POA: Diagnosis not present
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
## 2023-03-30 ENCOUNTER — Encounter: Payer: Self-pay | Admitting: Family

## 2023-03-30 NOTE — Progress Notes (Signed)
Acute Office Visit  Subjective:     Patient ID: Erin [name], female    DOB: 16-Nov-1990, 32 y.o.   MRN: [id number]  Patient is in today for No chief complaint on file.   Dizziness This is a recurrent problem. The current episode started in the past 7 days. The problem occurs intermittently. The problem has been waxing and waning. Associated symptoms include fatigue and vertigo. The symptoms are aggravated by exertion, stress, standing, walking, bending and coughing. She has tried position changes, lying down, sleep, rest, eating and drinking for the symptoms. The treatment provided no relief.     Review of Systems  Constitutional:  Positive for fatigue and malaise/fatigue.  Neurological:  Positive for dizziness and vertigo.        Objective:    BP (!) 119/90   Pulse 97   Ht 5\' 7"  (1.702 m)   Wt 247 lb (112 kg)   LMP 01/28/2023   SpO2 96%   BMI 38.69 kg/m   Physical Exam Vitals and nursing note reviewed.  Constitutional:      Appearance: Normal appearance. She is normal weight.  HENT:     Head: Normocephalic.  Eyes:     Extraocular Movements: Extraocular movements intact.     Conjunctiva/sclera: Conjunctivae normal.     Pupils: Pupils are equal, round, and reactive to light.  Cardiovascular:     Rate and Rhythm: Normal rate.  Pulmonary:     Effort: Pulmonary effort is normal.  Neurological:     General: No focal deficit present.     Mental Status: She is alert and oriented to person, place, and time. Mental status is at baseline.  Psychiatric:        Mood and Affect: Mood normal.        Behavior: Behavior normal.        Thought Content: Thought content normal.        Judgment: Judgment normal.     No results found for any visits on 02/12/23.  Recent Results (from the past 2160 hour(s))  POCT Urinalysis Dipstick (08657)     Status: Abnormal   Collection Time: 01/21/23  3:27 PM  Result Value Ref Range   Color, UA     Clarity, UA     Glucose,  UA Negative Negative   Bilirubin, UA 1+    Ketones, UA +    Spec Grav, UA >=1.030 (A) 1.010 - 1.025   Blood, UA Negative    pH, UA 6.0 5.0 - 8.0   Protein, UA Positive (A) Negative   Urobilinogen, UA 0.2 0.2 or 1.0 E.U./dL   Nitrite, UA Negative    Leukocytes, UA Negative Negative   Appearance     Odor    TB Skin Test     Status: Abnormal   Collection Time: 01/27/23  5:09 PM  Result Value Ref Range   TB Skin Test Positive    Induration 12 mm  QuantiFERON-TB Gold Plus     Status: None   Collection Time: 01/27/23  5:24 PM  Result Value Ref Range   QuantiFERON Incubation Incubation performed.    QuantiFERON Criteria Comment     Comment: QuantiFERON-TB Gold Plus is a qualitative indirect test for M tuberculosis infection (including disease) and is intended for use in conjunction with risk assessment, radiography, and other medical and diagnostic evaluations. The QuantiFERON-TB Gold Plus result is determined by subtracting the Nil value from either TB antigen (Ag) value. The Mitogen tube serves as  a control for the test.    QuantiFERON TB1 Ag Value 0.02 IU/mL   QuantiFERON TB2 Ag Value 0.03 IU/mL   QuantiFERON Nil Value 0.01 IU/mL   QuantiFERON Mitogen Value >10.00 IU/mL   QuantiFERON-TB Gold Plus Negative Negative    Comment: No response to M tuberculosis antigens detected. Infection with M tuberculosis is unlikely, but high risk individuals should be considered for additional testing (ATS/IDSA/CDC Clinical Practice Guidelines, 2017). The reference range is an Antigen minus Nil result of <0.35 IU/mL. Chemiluminescence immunoassay methodology   POCT XPERT XPRESS SARS COVID-2/FLU/RSV     Status: None   Collection Time: 03/04/23  9:47 AM  Result Value Ref Range   SARS Coronavirus 2 neg    FLU A neg    FLU B neg    RSV RNA, PCR neg     Allergies as of 02/12/2023       Reactions   Sulfa Antibiotics Hives   Sulfa Drugs Cross Reactors Other (See Comments)   Uncontrollable  body shaking.        Medication List        Accurate as of February 12, 2023 11:59 PM. If you have any questions, ask your nurse or doctor.          ACCRUFeR 30 MG Caps Generic drug: Ferric Maltol Take 1 capsule (30 mg total) by mouth in the morning and at bedtime.   Accu-Chek Guide w/Device Kit 1 Device by Does not apply route daily. Started by: Miki Kins   Alpha-Lipoic Acid 100 MG Caps Take by mouth.   ARIPiprazole 5 MG tablet Commonly known as: ABILIFY Take 1 tablet (5 mg total) by mouth at bedtime.   baclofen 10 MG tablet Commonly known as: LIORESAL Take 10 mg by mouth 3 (three) times daily. 1 tablet at bedtime   blood glucose meter kit and supplies Kit Dispense based on patient and insurance preference. Use up to four times daily as directed. Started by: Miki Kins   famotidine 20 MG tablet Commonly known as: PEPCID Take 1 tablet (20 mg total) by mouth 2 (two) times daily.   folic acid 1 MG tablet Commonly known as: FOLVITE Take 1 mg by mouth daily.   gabapentin 100 MG capsule Commonly known as: Neurontin Take 1 capsule (100 mg total) by mouth 3 (three) times daily.   Humira (2 Syringe) 40 MG/0.4ML prefilled syringe Generic drug: adalimumab Inject 40 mg into the skin.   ibuprofen 800 MG tablet Commonly known as: ADVIL Take 1 tablet (800 mg total) by mouth every 8 (eight) hours as needed.   metFORMIN 500 MG tablet Commonly known as: GLUCOPHAGE Take 1 tablet (500 mg total) by mouth at bedtime.   methotrexate 2.5 MG tablet Commonly known as: RHEUMATREX Take 2.5 mg by mouth once a week. Caution:Chemotherapy. Protect from light.   omeprazole 20 MG capsule Commonly known as: PRILOSEC Take 1 tablet by mouth daily.   ondansetron 4 MG disintegrating tablet Commonly known as: ZOFRAN-ODT Take 1 tablet (4 mg total) by mouth every 8 (eight) hours as needed for nausea or vomiting.   pregabalin 300 MG capsule Commonly known as: LYRICA Take  300 mg by mouth 2 (two) times daily. What changed: Another medication with the same name was removed. Continue taking this medication, and follow the directions you see here. Changed by: Miki Kins   rizatriptan 10 MG disintegrating tablet Commonly known as: MAXALT-MLT Take 1 tablet (10 mg total) by mouth as needed for  migraine. May repeat in 2 hours if needed   Semaglutide-Weight Management 1 MG/0.5ML Soaj Inject 1 mg into the skin once a week for 28 days.   Semaglutide-Weight Management 1.7 MG/0.75ML Soaj Inject 1.7 mg into the skin once a week for 28 days.   Semaglutide-Weight Management 2.4 MG/0.75ML Soaj Inject 2.4 mg into the skin once a week. Start taking on: April 03, 2023   traZODone 50 MG tablet Commonly known as: DESYREL Take 0.5-1 tablets (25-50 mg total) by mouth at bedtime as needed for sleep.   valACYclovir 1000 MG tablet Commonly known as: VALTREX Take 1 tablet (1,000 mg total) by mouth 2 (two) times daily.   venlafaxine XR 150 MG 24 hr capsule Commonly known as: EFFEXOR-XR Take 1 capsule (150 mg total) by mouth daily. Total of 225 mg daily. Take along with 75 mg cap   venlafaxine XR 75 MG 24 hr capsule Commonly known as: EFFEXOR-XR Take 1 capsule (75 mg total) by mouth daily with breakfast. Take total of 225 mg daily. Take along with 150 mg cap   Vitamin D-3 125 MCG (5000 UT) Tabs Take by mouth daily.            Assessment & Plan:   Problem List Items Addressed This Visit       Active Problems   Other fatigue   Other Visit Diagnoses     Palpitations    -  Primary   Dizziness          EKG in office today WNL.  Will set up for Holter.   No follow-ups on file.  Total time spent: 20 minutes  Miki Kins, FNP  02/12/2023   This document may have been prepared by [facility] Voice Recognition software and as such may include unintentional dictation errors.

## 2023-04-02 ENCOUNTER — Telehealth: Payer: Self-pay | Admitting: Family

## 2023-04-02 MED ORDER — PHENTERMINE HCL 37.5 MG PO TABS: 37.5000 mg | ORAL_TABLET | Freq: Every day | ORAL | 0 refills | Status: DC

## 2023-04-02 NOTE — Telephone Encounter (Signed)
Patient left VM asking about sending phentermine to [facility] since she was unable to tolerate the [facility]. Please advise.

## 2023-04-04 ENCOUNTER — Encounter: Payer: Self-pay | Admitting: Emergency Medicine

## 2023-04-04 ENCOUNTER — Other Ambulatory Visit: Payer: Self-pay

## 2023-04-04 ENCOUNTER — Emergency Department: Payer: Medicaid Other

## 2023-04-04 ENCOUNTER — Emergency Department
Admission: EM | Admit: 2023-04-04 | Discharge: 2023-04-05 | Disposition: A | Discharge status: Home / Self Care | Payer: Medicaid Other | Attending: Emergency Medicine | Admitting: Emergency Medicine

## 2023-04-04 DIAGNOSIS — Z20822 Contact with and (suspected) exposure to covid-19: Secondary | ICD-10-CM | POA: Insufficient documentation

## 2023-04-04 DIAGNOSIS — Z79899 Other long term (current) drug therapy: Secondary | ICD-10-CM | POA: Insufficient documentation

## 2023-04-04 DIAGNOSIS — R002 Palpitations: Secondary | ICD-10-CM | POA: Diagnosis present

## 2023-04-04 DIAGNOSIS — F419 Anxiety disorder, unspecified: Secondary | ICD-10-CM | POA: Diagnosis not present

## 2023-04-04 DIAGNOSIS — R000 Tachycardia, unspecified: Secondary | ICD-10-CM | POA: Diagnosis not present

## 2023-04-04 DIAGNOSIS — J45909 Unspecified asthma, uncomplicated: Secondary | ICD-10-CM | POA: Insufficient documentation

## 2023-04-04 LAB — COMPREHENSIVE METABOLIC PANEL
ALT: 47 U/L — ABNORMAL HIGH (ref 0–44)
AST: 43 U/L — ABNORMAL HIGH (ref 15–41)
Albumin: 3.5 g/dL (ref 3.5–5.0)
Alkaline Phosphatase: 71 U/L (ref 38–126)
Anion gap: 6 (ref 5–15)
BUN: 8 mg/dL (ref 6–20)
CO2: 29 mmol/L (ref 22–32)
Calcium: 8.6 mg/dL — ABNORMAL LOW (ref 8.9–10.3)
Chloride: 101 mmol/L (ref 98–111)
Creatinine, Ser: 0.73 mg/dL (ref 0.44–1.00)
GFR, Estimated: >60 mL/min (ref >60)
Glucose, Bld: 118 mg/dL — ABNORMAL HIGH (ref 70–99)
Potassium: 3.9 mmol/L (ref 3.5–5.1)
Sodium: 136 mmol/L (ref 135–145)
Total Bilirubin: 0.5 mg/dL (ref <1.2)
Total Protein: 7.5 g/dL (ref 6.5–8.1)

## 2023-04-04 LAB — CBC WITH DIFFERENTIAL/PLATELET
Abs Immature Granulocytes: 0.07 10*3/uL (ref 0.00–0.07)
Basophils Absolute: 0 10*3/uL (ref 0.0–0.1)
Basophils Relative: 0 %
Eosinophils Absolute: 0 10*3/uL (ref 0.0–0.5)
Eosinophils Relative: 0 %
HCT: 37.6 % (ref 36.0–46.0)
Hemoglobin: 12.3 g/dL (ref 12.0–15.0)
Immature Granulocytes: 1 %
Lymphocytes Relative: 24 %
Lymphs Abs: 1.2 10*3/uL (ref 0.7–4.0)
MCH: 29.5 pg (ref 26.0–34.0)
MCHC: 32.7 g/dL (ref 30.0–36.0)
MCV: 90.2 fL (ref 80.0–100.0)
Monocytes Absolute: 0.3 10*3/uL (ref 0.1–1.0)
Monocytes Relative: 7 %
Neutro Abs: 3.2 10*3/uL (ref 1.7–7.7)
Neutrophils Relative %: 68 %
Platelets: 301 10*3/uL (ref 150–400)
RBC: 4.17 MIL/uL (ref 3.87–5.11)
RDW: 14.8 % (ref 11.5–15.5)
WBC: 4.8 10*3/uL (ref 4.0–10.5)
nRBC: 0 % (ref 0.0–0.2)

## 2023-04-04 LAB — URINALYSIS, ROUTINE W REFLEX MICROSCOPIC
Bilirubin Urine: NEGATIVE
Glucose, UA: NEGATIVE mg/dL
Hgb urine dipstick: NEGATIVE
Ketones, ur: NEGATIVE mg/dL
Leukocytes,Ua: NEGATIVE
Nitrite: NEGATIVE
Protein, ur: NEGATIVE mg/dL
Specific Gravity, Urine: 1.006 (ref 1.005–1.030)
pH: 8 (ref 5.0–8.0)

## 2023-04-04 LAB — TROPONIN I (HIGH SENSITIVITY): Troponin I (High Sensitivity): 2 ng/L (ref <18)

## 2023-04-04 LAB — URINE DRUG SCREEN, QUALITATIVE (ARMC ONLY)
Amphetamines, Ur Screen: NOT DETECTED
Barbiturates, Ur Screen: NOT DETECTED
Benzodiazepine, Ur Scrn: NOT DETECTED
Cannabinoid 50 Ng, Ur ~~LOC~~: NOT DETECTED
Cocaine Metabolite,Ur ~~LOC~~: NOT DETECTED
MDMA (Ecstasy)Ur Screen: NOT DETECTED
Methadone Scn, Ur: NOT DETECTED
Opiate, Ur Screen: NOT DETECTED
Phencyclidine (PCP) Ur S: NOT DETECTED
Tricyclic, Ur Screen: NOT DETECTED

## 2023-04-04 LAB — POC URINE PREG, ED: Preg Test, Ur: NEGATIVE

## 2023-04-04 LAB — TSH: TSH: 1.111 u[IU]/mL (ref 0.350–4.500)

## 2023-04-04 MED ORDER — KETOROLAC TROMETHAMINE 30 MG/ML IJ SOLN
15.0000 mg | Freq: Once | INTRAMUSCULAR | Status: AC
Start: 2023-04-05 — End: 2023-04-09
  Administered 2023-04-05: 15 mg via INTRAVENOUS
  Filled 2023-04-04: qty 1

## 2023-04-04 MED ORDER — SODIUM CHLORIDE 0.9 % IV BOLUS
1000.0000 mL | Freq: Once | INTRAVENOUS | Status: AC
Start: 2023-04-05 — End: 2023-04-05
  Administered 2023-04-05: 1000 mL via INTRAVENOUS

## 2023-04-04 NOTE — ED Provider Notes (Signed)
[facility] Provider Note    Event Date/Time   First MD Initiated Contact with Patient 04/04/23 2341     (approximate)   History   Palpitations   HPI  Erin [name] is a 32 y.o. female who presents to the ED from home with a chief complaint of palpitations.  Patient reports ongoing palpitations times several months, wore a monitor ordered by her PCP last month.  States her Apple Watch has noted high heart rate all day.  Feels fast heart rate, not skipping beats.  Overall feels fatigued.  Has exam so has also had a stressful week.  Denies associated fever/chills, cough, chest pain, shortness of breath, abdominal pain, nausea, vomiting or dizziness.  Started phentermine yesterday.  She had been on it quite sometime ago without side effects.  Denies recent trauma, travel or hormone use.     Past Medical History   Past Medical History:  Diagnosis Date   Acute cholecystitis 09/03/2021   ADD (attention deficit disorder)    ADHD   Allergic genetic state    Amniotic fluid leaking 08/12/2015   Anxiety    Asthma    Chronic back pain    Depression    postpartum depression in past   Essential hypertension 05/29/2022   Family history of Down syndrome 03/16/2015   Fatigue    GERD (gastroesophageal reflux disease)    Headache    Hyperemesis affecting pregnancy, antepartum 03/10/2015   Nausea and vomiting in pregnancy 07/05/2015   Numbness and tingling    Pelvic pain in female 12/08/2022   Post-operative state 09/08/2015   Seasonal allergies    Seasonal allergies    Seizures (HCC)    during childhood   Viral respiratory illness 03/10/2015     Active Problem List   Patient Active Problem List   Diagnosis Date Noted   Herpes zoster without complication 01/01/2023   Other fatigue 12/10/2022   Asthma 12/08/2022   Essential hypertension 05/29/2022   Chronic migraine without aura 05/14/2022   Idiopathic small fiber peripheral neuropathy  05/14/2022   Posttraumatic stress disorder 05/14/2022   Morbid obesity (HCC) 05/14/2022   Mixed anxiety and depressive disorder 05/14/2022   Seronegative spondyloarthropathy 10/04/2021   Neuropathic pain 02/04/2021   Myofascial pain syndrome, cervical 05/26/2019   Left sciatic nerve pain 06/05/2015   Fibromyalgia 02/14/2015   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     Past Surgical History   Past Surgical History:  Procedure Laterality Date   CESAREAN SECTION  04/29/2012   CESAREAN SECTION WITH BILATERAL TUBAL LIGATION Bilateral 09/08/2015   Procedure: CESAREAN SECTION WITH BILATERAL TUBAL LIGATION;  Surgeon: Suzy Bouchard, MD;  Location: [facility] ORS;  Service: Obstetrics;  Laterality: Bilateral;   CHOLECYSTECTOMY     ESOPHAGOGASTRODUODENOSCOPY N/A 10/17/2021   Procedure: ESOPHAGOGASTRODUODENOSCOPY (EGD);  Surgeon: Toledo, Boykin Nearing, MD;  Location: [facility] ENDOSCOPY;  Service: Gastroenterology;  Laterality: N/A;   TONSILLECTOMY     TUBAL LIGATION       Home Medications   Prior to Admission medications   Medication Sig Start Date End Date Taking? Authorizing Provider  Adalimumab (HUMIRA) 40 MG/0.4ML PSKT Inject 40 mg into the skin.    [provider]  Alpha-Lipoic Acid 100 MG CAPS Take by mouth.    [provider]  ARIPiprazole (ABILIFY) 5 MG tablet Take 1 tablet (5 mg total) by mouth at bedtime. 02/17/23 05/18/23  Neysa Hotter, MD  baclofen (LIORESAL) 10 MG tablet Take 10 mg by  mouth 3 (three) times daily. 1 tablet at bedtime    [provider]  blood glucose meter kit and supplies KIT Dispense based on patient and insurance preference. Use up to four times daily as directed. 02/12/23   Miki Kins, FNP  Cholecalciferol (VITAMIN D-3) 125 MCG (5000 UT) TABS Take by mouth daily.    [provider]  Continuous Glucose Sensor (DEXCOM G7 SENSOR) MISC Use 10 days sensor with receiver to check sugars throughout the  day x 10 days repeat every 10 days 02/27/23   Miki Kins, FNP  famotidine (PEPCID) 20 MG tablet Take 1 tablet (20 mg total) by mouth 2 (two) times daily. 12/10/22   Miki Kins, FNP  Ferric Maltol (ACCRUFER) 30 MG CAPS Take 1 capsule (30 mg total) by mouth in the morning and at bedtime. 12/16/22   Miki Kins, FNP  folic acid (FOLVITE) 1 MG tablet Take 1 mg by mouth daily. 07/25/21   [provider]  gabapentin (NEURONTIN) 100 MG capsule Take 1 capsule (100 mg total) by mouth 3 (three) times daily. 01/01/23 01/01/24  Scoggins, Amber, NP  ibuprofen (ADVIL) 800 MG tablet Take 1 tablet (800 mg total) by mouth every 8 (eight) hours as needed. 12/10/22   Miki Kins, FNP  methotrexate (RHEUMATREX) 2.5 MG tablet Take 2.5 mg by mouth once a week. Caution:Chemotherapy. Protect from light.    [provider]  omeprazole (PRILOSEC) 20 MG capsule Take 1 tablet by mouth daily. 06/25/21   [provider]  ondansetron (ZOFRAN-ODT) 4 MG disintegrating tablet Take 1 tablet (4 mg total) by mouth every 8 (eight) hours as needed for nausea or vomiting. 01/17/23   Miki Kins, FNP  phentermine (ADIPEX-P) 37.5 MG tablet Take 1 tablet (37.5 mg total) by mouth daily before breakfast. 04/02/23   Miki Kins, FNP  pregabalin (LYRICA) 300 MG capsule Take 300 mg by mouth 2 (two) times daily. 03/21/23   [provider]  rizatriptan (MAXALT-MLT) 10 MG disintegrating tablet Take 1 tablet (10 mg total) by mouth as needed for migraine. May repeat in 2 hours if needed 06/02/19   Lomax, Amy, NP  Semaglutide-Weight Management 2.4 MG/0.75ML SOAJ Inject 2.4 mg into the skin once a week. 04/03/23 05/29/23  Miki Kins, FNP  traZODone (DESYREL) 50 MG tablet Take 0.5-1 tablets (25-50 mg total) by mouth at bedtime as needed for sleep. 03/08/23 06/06/23  Neysa Hotter, MD  valACYclovir (VALTREX) 1000 MG tablet Take 1 tablet (1,000 mg total) by mouth 2 (two) times daily. 01/01/23    Scoggins, Amber, NP  venlafaxine XR (EFFEXOR-XR) 150 MG 24 hr capsule Take 1 capsule (150 mg total) by mouth daily. Total of 225 mg daily. Take along with 75 mg cap 12/10/22 06/08/23  Miki Kins, FNP  venlafaxine XR (EFFEXOR-XR) 75 MG 24 hr capsule Take 1 capsule (75 mg total) by mouth daily with breakfast. Take total of 225 mg daily. Take along with 150 mg cap 12/10/22 06/08/23  Miki Kins, FNP     Allergies  Sulfa antibiotics and Sulfa drugs cross reactors   Family History   Family History  Problem Relation Age of Onset   Anxiety disorder Mother    Depression Mother    Hypertension Mother    Arthritis Mother    Cancer Mother    Diabetes Mother    Migraines Mother    Cancer Father    Depression Maternal Uncle    Suicidality Maternal Uncle  Bipolar disorder Maternal Grandfather    Diabetes Maternal Grandmother      Physical Exam  Triage Vital Signs: ED Triage Vitals  Encounter Vitals Group     BP 04/04/23 1946 (!) 145/98     Systolic BP Percentile --      Diastolic BP Percentile --      Pulse Rate 04/04/23 1946 (!) 120     Resp 04/04/23 1946 18     Temp 04/04/23 1946 98.6 F (37 C)     Temp Source 04/04/23 1946 Oral     SpO2 04/04/23 1946 97 %     Weight 04/04/23 1945 240 lb (108.9 kg)     Height --      Head Circumference --      Peak Flow --      Pain Score 04/04/23 1944 10     Pain Loc --      Pain Education --      Exclude from Growth Chart --     Updated Vital Signs: BP 125/77 (BP Location: Left Arm)   Pulse 89   Temp 98.6 F (37 C) (Oral)   Resp 18   Wt 108.9 kg   LMP 03/27/2023 (Approximate)   SpO2 98%   BMI 37.59 kg/m    General: Awake, no distress.  CV:  Mildly tachycardic.  Good peripheral perfusion.  Resp:  Normal effort.  CTAB. Abd:  Nontender.  No distention.  Other:  Mildly anxious.  No thyromegaly.  Bilateral calves are supple and nontender.   ED Results / Procedures / Treatments  Labs (all labs ordered are listed, but  only abnormal results are displayed) Labs Reviewed  COMPREHENSIVE METABOLIC PANEL - Abnormal; Notable for the following components:      Result Value   Glucose, Bld 118 (*)    Calcium 8.6 (*)    AST 43 (*)    ALT 47 (*)    All other components within normal limits  URINALYSIS, ROUTINE W REFLEX MICROSCOPIC - Abnormal; Notable for the following components:   Color, Urine STRAW (*)    APPearance CLEAR (*)    All other components within normal limits  RESP PANEL BY RT-PCR (RSV, FLU A&B, COVID)  [id number]  CBC WITH DIFFERENTIAL/PLATELET  TSH  URINE DRUG SCREEN, QUALITATIVE ([facility] ONLY)  POC URINE PREG, ED  TROPONIN I (HIGH SENSITIVITY)  TROPONIN I (HIGH SENSITIVITY)     EKG  ED ECG REPORT I, Cheyenna Pankowski J, the attending physician, personally viewed and interpreted this ECG.   Date: 04/05/2023  EKG Time: 1949  Rate: 116  Rhythm: sinus tachycardia  Axis: Normal  Intervals:none  ST&T Change: Nonspecific    RADIOLOGY I have independently visualized and interpreted patient's imaging study as well as noted the radiology interpretation:  X-ray: No acute cardiopulmonary process  Official radiology report(s): DG Chest 2 View  Result Date: 04/04/2023 CLINICAL DATA:  Cardiac palpitations EXAM: CHEST - 2 VIEW COMPARISON:  05/27/2016 FINDINGS: The heart size and mediastinal contours are within normal limits. Both lungs are clear. The visualized skeletal structures are unremarkable. IMPRESSION: No active cardiopulmonary disease. Electronically Signed   By: Alcide Clever M.D.   On: 04/04/2023 23:44     PROCEDURES:  Critical Care performed: No  .1-3 Lead EKG Interpretation  Performed by: Irean Hong, MD Authorized by: Irean Hong, MD     Interpretation: abnormal     ECG rate:  110   ECG rate assessment: tachycardic     Rhythm: sinus  tachycardia     Ectopy: none     Conduction: normal   Comments:     Patient placed on cardiac monitor to evaluate for arrhythmias    MEDICATIONS  ORDERED IN ED: Medications  sodium chloride 0.9 % bolus 1,000 mL (1,000 mLs Intravenous New Bag/Given 04/05/23 0011)  ketorolac (TORADOL) 30 MG/ML injection 15 mg (15 mg Intravenous Given 04/05/23 0012)     IMPRESSION / MDM / ASSESSMENT AND PLAN / ED COURSE  I reviewed the triage vital signs and the nursing notes.                             32 year old female presenting with palpitations. Differential diagnosis includes, but is not limited to, ACS, aortic dissection, pulmonary embolism, cardiac tamponade, pneumothorax, pneumonia, pericarditis, myocarditis, GI-related causes including esophagitis/gastritis, and musculoskeletal chest wall pain.   I have personally reviewed patient's records and note her PCP office visit from 02/12/2023 for palpitations and dizziness.  Patient is supposed to be scheduled for sleep study.  Has not been referred to cardiology.  Patient's presentation is most consistent with acute illness / injury with system symptoms.  The patient is on the cardiac monitor to evaluate for evidence of arrhythmia and/or significant heart rate changes.  Laboratory results unremarkable with normal WBC, unremarkable electrolytes, initial troponin negative, UA/UDS negative.  Thyroid was added on which is normal.  Awaiting repeat troponin, respiratory panel.  Will administer IV fluids, ketorolac for body aches and reassess.  Clinical Course as of 04/05/23 0201  Sat Apr 05, 2023  0159 Patient resting no acute distress.  Heart rate 85.  Repeat troponin and TSH negative.  Will refer patient to cardiology for outpatient follow-up.  Strict return precautions given.  Patient verbalizes understanding and agrees with plan of care. [JS]    Clinical Course User Index [JS] Irean Hong, MD     FINAL CLINICAL IMPRESSION(S) / ED DIAGNOSES   Final diagnoses:  Palpitations     Rx / DC Orders   ED Discharge Orders          Ordered    Ambulatory referral to Cardiology       Comments: If  you have not heard from the Cardiology office within the next 72 hours please call [phone].   04/05/23 0141             Note:  This document was prepared using Dragon voice recognition software and may include unintentional dictation errors.   Irean Hong, MD 04/05/23 [id number]

## 2023-04-04 NOTE — ED Notes (Signed)
No answer when called from lobby 

## 2023-04-04 NOTE — ED Triage Notes (Signed)
Patient c/o high heart rate, max 140 per apple watch.  Patient reports it's been going on all day, but she's unsure.

## 2023-04-04 NOTE — ED Provider Notes (Incomplete)
[facility] Provider Note    Event Date/Time   First MD Initiated Contact with Patient 04/04/23 2341     (approximate)   History   No chief complaint on file.   HPI {Remember to add pertinent medical, surgical, social, and/or OB history to HPI:1} Erin [name] is a 32 y.o. female  ***       Past Medical History   Past Medical History:  Diagnosis Date   Acute cholecystitis 09/03/2021   ADD (attention deficit disorder)    ADHD   Allergic genetic state    Amniotic fluid leaking 08/12/2015   Anxiety    Asthma    Chronic back pain    Depression    postpartum depression in past   Essential hypertension 05/29/2022   Family history of Down syndrome 03/16/2015   Fatigue    GERD (gastroesophageal reflux disease)    Headache    Hyperemesis affecting pregnancy, antepartum 03/10/2015   Nausea and vomiting in pregnancy 07/05/2015   Numbness and tingling    Pelvic pain in female 12/08/2022   Post-operative state 09/08/2015   Seasonal allergies    Seasonal allergies    Seizures (HCC)    during childhood   Viral respiratory illness 03/10/2015     Active Problem List   Patient Active Problem List   Diagnosis Date Noted   Herpes zoster without complication 01/01/2023   Other fatigue 12/10/2022   Asthma 12/08/2022   Essential hypertension 05/29/2022   Chronic migraine without aura 05/14/2022   Idiopathic small fiber peripheral neuropathy 05/14/2022   Posttraumatic stress disorder 05/14/2022   Morbid obesity (HCC) 05/14/2022   Mixed anxiety and depressive disorder 05/14/2022   Seronegative spondyloarthropathy 10/04/2021   Neuropathic pain 02/04/2021   Myofascial pain syndrome, cervical 05/26/2019   Left sciatic nerve pain 06/05/2015   Fibromyalgia 02/14/2015   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     Past Surgical History   Past Surgical History:  Procedure Laterality Date   CESAREAN SECTION   04/29/2012   CESAREAN SECTION WITH BILATERAL TUBAL LIGATION Bilateral 09/08/2015   Procedure: CESAREAN SECTION WITH BILATERAL TUBAL LIGATION;  Surgeon: Suzy Bouchard, MD;  Location: [facility] ORS;  Service: Obstetrics;  Laterality: Bilateral;   CHOLECYSTECTOMY     ESOPHAGOGASTRODUODENOSCOPY N/A 10/17/2021   Procedure: ESOPHAGOGASTRODUODENOSCOPY (EGD);  Surgeon: Toledo, Boykin Nearing, MD;  Location: [facility] ENDOSCOPY;  Service: Gastroenterology;  Laterality: N/A;   TONSILLECTOMY     TUBAL LIGATION       Home Medications   Prior to Admission medications   Medication Sig Start Date End Date Taking? Authorizing Provider  Adalimumab (HUMIRA) 40 MG/0.4ML PSKT Inject 40 mg into the skin.    [provider]  Alpha-Lipoic Acid 100 MG CAPS Take by mouth.    [provider]  ARIPiprazole (ABILIFY) 5 MG tablet Take 1 tablet (5 mg total) by mouth at bedtime. 02/17/23 05/18/23  Neysa Hotter, MD  baclofen (LIORESAL) 10 MG tablet Take 10 mg by mouth 3 (three) times daily. 1 tablet at bedtime    [provider]  blood glucose meter kit and supplies KIT Dispense based on patient and insurance preference. Use up to four times daily as directed. 02/12/23   Miki Kins, FNP  Cholecalciferol (VITAMIN D-3) 125 MCG (5000 UT) TABS Take by mouth daily.    [provider]  Continuous Glucose Sensor (DEXCOM G7 SENSOR) MISC Use 10 days sensor with receiver to check sugars throughout the day x  10 days repeat every 10 days 02/27/23   Miki Kins, FNP  famotidine (PEPCID) 20 MG tablet Take 1 tablet (20 mg total) by mouth 2 (two) times daily. 12/10/22   Miki Kins, FNP  Ferric Maltol (ACCRUFER) 30 MG CAPS Take 1 capsule (30 mg total) by mouth in the morning and at bedtime. 12/16/22   Miki Kins, FNP  folic acid (FOLVITE) 1 MG tablet Take 1 mg by mouth daily. 07/25/21   [provider]  gabapentin (NEURONTIN) 100 MG capsule Take 1 capsule (100 mg total) by mouth  3 (three) times daily. 01/01/23 01/01/24  Scoggins, Amber, NP  ibuprofen (ADVIL) 800 MG tablet Take 1 tablet (800 mg total) by mouth every 8 (eight) hours as needed. 12/10/22   Miki Kins, FNP  methotrexate (RHEUMATREX) 2.5 MG tablet Take 2.5 mg by mouth once a week. Caution:Chemotherapy. Protect from light.    [provider]  omeprazole (PRILOSEC) 20 MG capsule Take 1 tablet by mouth daily. 06/25/21   [provider]  ondansetron (ZOFRAN-ODT) 4 MG disintegrating tablet Take 1 tablet (4 mg total) by mouth every 8 (eight) hours as needed for nausea or vomiting. 01/17/23   Miki Kins, FNP  phentermine (ADIPEX-P) 37.5 MG tablet Take 1 tablet (37.5 mg total) by mouth daily before breakfast. 04/02/23   Miki Kins, FNP  pregabalin (LYRICA) 300 MG capsule Take 300 mg by mouth 2 (two) times daily. 03/21/23   [provider]  rizatriptan (MAXALT-MLT) 10 MG disintegrating tablet Take 1 tablet (10 mg total) by mouth as needed for migraine. May repeat in 2 hours if needed 06/02/19   Lomax, Amy, NP  Semaglutide-Weight Management 2.4 MG/0.75ML SOAJ Inject 2.4 mg into the skin once a week. 04/03/23 05/29/23  Miki Kins, FNP  traZODone (DESYREL) 50 MG tablet Take 0.5-1 tablets (25-50 mg total) by mouth at bedtime as needed for sleep. 03/08/23 06/06/23  Neysa Hotter, MD  valACYclovir (VALTREX) 1000 MG tablet Take 1 tablet (1,000 mg total) by mouth 2 (two) times daily. 01/01/23   Scoggins, Amber, NP  venlafaxine XR (EFFEXOR-XR) 150 MG 24 hr capsule Take 1 capsule (150 mg total) by mouth daily. Total of 225 mg daily. Take along with 75 mg cap 12/10/22 06/08/23  Miki Kins, FNP  venlafaxine XR (EFFEXOR-XR) 75 MG 24 hr capsule Take 1 capsule (75 mg total) by mouth daily with breakfast. Take total of 225 mg daily. Take along with 150 mg cap 12/10/22 06/08/23  Miki Kins, FNP     Allergies  Sulfa antibiotics and Sulfa drugs cross reactors   Family History   Family  History  Problem Relation Age of Onset   Anxiety disorder Mother    Depression Mother    Hypertension Mother    Arthritis Mother    Cancer Mother    Diabetes Mother    Migraines Mother    Cancer Father    Depression Maternal Uncle    Suicidality Maternal Uncle    Bipolar disorder Maternal Grandfather    Diabetes Maternal Grandmother      Physical Exam  Triage Vital Signs: ED Triage Vitals  Encounter Vitals Group     BP 04/04/23 1946 (!) 145/98     Systolic BP Percentile --      Diastolic BP Percentile --      Pulse Rate 04/04/23 1946 (!) 120     Resp 04/04/23 1946 18     Temp 04/04/23 1946 98.6 F (  37 C)     Temp Source 04/04/23 1946 Oral     SpO2 04/04/23 1946 97 %     Weight 04/04/23 1945 240 lb (108.9 kg)     Height --      Head Circumference --      Peak Flow --      Pain Score 04/04/23 1944 10     Pain Loc --      Pain Education --      Exclude from Growth Chart --     Updated Vital Signs: BP (!) 145/98   Pulse (!) 120   Temp 98.6 F (37 C) (Oral)   Resp 18   Wt 108.9 kg   LMP 03/27/2023 (Approximate)   SpO2 97%   BMI 37.59 kg/m   {Only need to document appropriate and relevant physical exam:1} General: Awake, no distress. *** CV:  Good peripheral perfusion. *** Resp:  Normal effort. *** Abd:  No distention. *** Other:  ***   ED Results / Procedures / Treatments  Labs (all labs ordered are listed, but only abnormal results are displayed) Labs Reviewed  COMPREHENSIVE METABOLIC PANEL - Abnormal; Notable for the following components:      Result Value   Glucose, Bld 118 (*)    Calcium 8.6 (*)    AST 43 (*)    ALT 47 (*)    All other components within normal limits  URINALYSIS, ROUTINE W REFLEX MICROSCOPIC - Abnormal; Notable for the following components:   Color, Urine STRAW (*)    APPearance CLEAR (*)    All other components within normal limits  CBC WITH DIFFERENTIAL/PLATELET  TSH  URINE DRUG SCREEN, QUALITATIVE ([facility] ONLY)  POC URINE  PREG, ED  TROPONIN I (HIGH SENSITIVITY)  TROPONIN I (HIGH SENSITIVITY)     EKG  ***   RADIOLOGY *** {You MUST document your own interpretation of imaging, as well as the fact that you reviewed the radiologist's report!:1}  Official radiology report(s): No results found.   PROCEDURES:  Critical Care performed: {CriticalCareYesNo:19197::"Yes, see critical care procedure note(s)","No"}  Procedures   MEDICATIONS ORDERED IN ED: Medications - No data to display   IMPRESSION / MDM / ASSESSMENT AND PLAN / ED COURSE  I reviewed the triage vital signs and the nursing notes.                              Differential diagnosis includes, but is not limited to, ***  Patient's presentation is most consistent with {EM COPA:27473}  {If the patient is on the monitor, remove the brackets and asterisks on the sentence below and remember to document it as a Procedure as well. Otherwise delete the sentence below:1} {**The patient is on the cardiac monitor to evaluate for evidence of arrhythmia and/or significant heart rate changes.**}  {Remember to include, when applicable, any/all of the following data: independent review of imaging independent review of labs (comment specifically on pertinent positives and negatives) review of specific prior hospitalizations, PCP/specialist notes, etc. discuss meds given and prescribed document any discussion with consultants (including hospitalists) any clinical decision tools you used and why (PECARN, NEXUS, etc.) did you consider admitting the patient? document social determinants of health affecting patient's care (homelessness, inability to follow up in a timely fashion, etc) document any pre-existing conditions increasing risk on current visit (e.g. diabetes and HTN increasing danger of high-risk chest pain/ACS) describes what meds you gave (especially parenteral) and why any  other interventions?:1}      FINAL CLINICAL IMPRESSION(S) / ED  DIAGNOSES   Final diagnoses:  None     Rx / DC Orders   ED Discharge Orders     None        Note:  This document was prepared using Dragon voice recognition software and may include unintentional dictation errors.

## 2023-04-05 ENCOUNTER — Encounter: Payer: Self-pay | Admitting: Family

## 2023-04-05 LAB — RESP PANEL BY RT-PCR (RSV, FLU A&B, COVID)  RVPGX2
Influenza A by PCR: NEGATIVE
Influenza B by PCR: NEGATIVE
Resp Syncytial Virus by PCR: NEGATIVE
SARS Coronavirus 2 by RT PCR: NEGATIVE

## 2023-04-05 LAB — TROPONIN I (HIGH SENSITIVITY): Troponin I (High Sensitivity): 2 ng/L (ref <18)

## 2023-04-05 MED ORDER — PROPRANOLOL HCL 20 MG PO TABS: 20.0000 mg | ORAL_TABLET | Freq: Two times a day (BID) | ORAL | 11 refills | Status: DC

## 2023-04-05 NOTE — Discharge Instructions (Signed)
Drink plenty of fluids and decrease caffeine intake if possible.  Return to the ER for worsening symptoms, persistent vomiting, difficulty breathing or other concerns.

## 2023-04-07 ENCOUNTER — Telehealth: Payer: Self-pay

## 2023-04-07 NOTE — Telephone Encounter (Signed)
Did you send her propranolol to [facility]? They say they haven't received it. Please advise.

## 2023-04-08 ENCOUNTER — Ambulatory Visit: Payer: Medicaid Other | Admitting: Licensed Clinical Social Worker

## 2023-04-10 ENCOUNTER — Ambulatory Visit: Payer: Medicaid Other | Admitting: Licensed Clinical Social Worker

## 2023-04-10 ENCOUNTER — Ambulatory Visit: Payer: Medicaid Other | Admitting: Family

## 2023-04-10 VITALS — BP 124/86 | HR 91 | Ht 68.0 in | Wt 253.6 lb

## 2023-04-10 DIAGNOSIS — F411 Generalized anxiety disorder: Secondary | ICD-10-CM | POA: Diagnosis not present

## 2023-04-10 DIAGNOSIS — F4310 Post-traumatic stress disorder, unspecified: Secondary | ICD-10-CM | POA: Diagnosis not present

## 2023-04-10 DIAGNOSIS — R002 Palpitations: Secondary | ICD-10-CM

## 2023-04-10 DIAGNOSIS — F3341 Major depressive disorder, recurrent, in partial remission: Secondary | ICD-10-CM

## 2023-04-10 DIAGNOSIS — Z0130 Encounter for examination of blood pressure without abnormal findings: Secondary | ICD-10-CM

## 2023-04-10 MED ORDER — PROPRANOLOL HCL 20 MG PO TABS: 20.0000 mg | ORAL_TABLET | Freq: Two times a day (BID) | ORAL | 11 refills | Status: AC

## 2023-04-10 MED ORDER — PROPRANOLOL HCL 20 MG PO TABS: 20.0000 mg | ORAL_TABLET | Freq: Two times a day (BID) | ORAL | 11 refills | Status: DC
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
## 2023-04-16 ENCOUNTER — Encounter: Payer: Self-pay | Admitting: Family

## 2023-04-21 ENCOUNTER — Ambulatory Visit (INDEPENDENT_AMBULATORY_CARE_PROVIDER_SITE_OTHER): Payer: Medicaid Other | Admitting: Licensed Clinical Social Worker

## 2023-04-21 DIAGNOSIS — F3341 Major depressive disorder, recurrent, in partial remission: Secondary | ICD-10-CM

## 2023-04-21 DIAGNOSIS — F4310 Post-traumatic stress disorder, unspecified: Secondary | ICD-10-CM | POA: Diagnosis not present

## 2023-04-21 NOTE — Progress Notes (Signed)
THERAPIST PROGRESS NOTE  Session Time: 8:04am-8:38am  Participation Level: Active  Behavioral Response: CasualAlertEuthymic  Type of Therapy: Individual Therapy  Treatment Goals addressed:  Goal: LTG: Erin "Danielle" will score less than 5 on the Generalized Anxiety Disorder 7 Scale (GAD-7)               Goal: STG: Erin "Danielle" will practice problem solving skills 3 times per week for the next 4 weeks.     LTG: Recall traumatic events without becoming overwhelmed with negative emotions   ProgressTowards Goals: Progressing  Interventions: Strength-based, Assertiveness Training, and Supportive  Summary: Erin [name] is a 32 y.o. female who presents with symptoms of anxiety and depression.  Patient reports symptoms to include fatigue and anxious symptoms.  Patient reports she has not experienced a panic attack since her last session.  Patient reports marked improvement with worry, mood, self affect.  Pt was oriented times 5. Pt was cooperative and engaged. Pt denies SI/HI/AVH.     Patient utilized therapeutic space to process newfound motivation to continue to find opportunities to work more in order to find a new space for her and her children to reside.  Patient reported she is looking forward to continuing with her education and also is looking for alternative hours to work in order to improve finances.  Patient reflected on recent situations where she has been pushing herself to ask for help and in return has been building her support system.  Patient identified female role models who have stepped up and offered to help engage socially with her son since his father is currently not present in his life.  Patient reports improvement with misplaced guilt stating improvement related to "not feeling like I was less than and I am proud I was able to reach out to others."  Patient reflected on past traumas that have instilled a fear of depending on others especially around  difficulty trusting men.  Patient reflected on experiences of being vulnerable with others and having a positive reaction.  Reviewed stages of grief and patient identified ways in which she is moving towards accepting her situation and letting go of control.  Reflected on ways she has been successful in establishing healthier boundaries.  Suicidal/Homicidal: Nowithout intent/plan  Therapist Response: Cln utilized active and supportive reflection to create a safe environment for patient to process recent life events.  Clinician assessed for current symptoms, stressors, safety since last session.  Continued to utilize strength-based approach to assist in improving patient's confidence and exploring what she can and cannot control while coparenting with her child's father.  Discussed ways in which patient can be assertive and communicate her true feelings with those within her support system.  Provided praise for patient's ability to identify further support, delegate parenting responsibilities, engage in appropriate self-care since last session.  Also provided praise for patient's ability to let go of control and give herself grace.  Plan: Return again in 2 weeks.  Diagnosis: PTSD (post-traumatic stress disorder)  MDD (major depressive disorder), recurrent, in partial remission (HCC)   Collaboration of Care: AEB psychiatrist can access notes and cln. Will review psychiatrists' notes. Check in with the patient and will see LCSW per availability. Patient agreed with treatment recommendations.   Patient/Guardian was advised Release of Information must be obtained prior to any record release in order to collaborate their care with an outside provider. Patient/Guardian was advised if they have not already done so to contact the registration department to sign all  necessary forms in order for [place] to release information regarding their care.   Consent: Patient/Guardian gives verbal consent for treatment and  assignment of benefits for services provided during this visit. Patient/Guardian expressed understanding and agreed to proceed.   Dereck Leep, LCSW 04/21/2023

## 2023-04-22 ENCOUNTER — Ambulatory Visit: Payer: Medicaid Other | Admitting: Licensed Clinical Social Worker
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
## 2023-04-26 ENCOUNTER — Encounter: Payer: Self-pay | Admitting: Family

## 2023-04-29 ENCOUNTER — Encounter: Payer: Self-pay | Admitting: Psychiatry

## 2023-04-29 ENCOUNTER — Ambulatory Visit (INDEPENDENT_AMBULATORY_CARE_PROVIDER_SITE_OTHER): Payer: Medicaid Other | Admitting: Psychiatry

## 2023-04-29 VITALS — BP 99/69 | HR 69 | Temp 95.8°F | Ht 68.0 in | Wt 256.4 lb

## 2023-04-29 DIAGNOSIS — F330 Major depressive disorder, recurrent, mild: Secondary | ICD-10-CM

## 2023-04-29 DIAGNOSIS — F4310 Post-traumatic stress disorder, unspecified: Secondary | ICD-10-CM | POA: Diagnosis not present

## 2023-04-29 DIAGNOSIS — E611 Iron deficiency: Secondary | ICD-10-CM

## 2023-04-29 DIAGNOSIS — G4700 Insomnia, unspecified: Secondary | ICD-10-CM | POA: Diagnosis not present

## 2023-04-29 DIAGNOSIS — G2581 Restless legs syndrome: Secondary | ICD-10-CM | POA: Diagnosis not present

## 2023-04-29 MED ORDER — ARIPIPRAZOLE 10 MG PO TABS: 10.0000 mg | ORAL_TABLET | Freq: Every evening | ORAL | 0 refills | Status: DC

## 2023-04-29 MED ORDER — VENLAFAXINE HCL ER 150 MG PO CP24: 150.0000 mg | ORAL_CAPSULE | Freq: Every day | ORAL | 1 refills | Status: DC

## 2023-04-29 MED ORDER — VENLAFAXINE HCL ER 75 MG PO CP24: 75.0000 mg | ORAL_CAPSULE | Freq: Every day | ORAL | 1 refills | Status: DC

## 2023-04-29 MED ORDER — TRAZODONE HCL 50 MG PO TABS: 25.0000 mg | ORAL_TABLET | Freq: Every evening | ORAL | 0 refills | Status: DC | PRN

## 2023-04-29 NOTE — Patient Instructions (Signed)
Continue venlafaxine 225 mg daily Increase Abilify 10 mg at night Continue trazodone 50 mg at night as needed for insomnia Obtain lab (iron panels) Next appointment- 2/11 at 8:30

## 2023-05-08 ENCOUNTER — Ambulatory Visit: Payer: Medicaid Other | Admitting: Licensed Clinical Social Worker

## 2023-05-11 ENCOUNTER — Encounter: Payer: Self-pay | Admitting: Family

## 2023-05-16 ENCOUNTER — Encounter: Payer: Self-pay | Admitting: Cardiovascular Disease

## 2023-05-22 ENCOUNTER — Ambulatory Visit: Payer: Medicaid Other | Admitting: Licensed Clinical Social Worker

## 2023-05-22 DIAGNOSIS — F4310 Post-traumatic stress disorder, unspecified: Secondary | ICD-10-CM

## 2023-05-22 DIAGNOSIS — F330 Major depressive disorder, recurrent, mild: Secondary | ICD-10-CM | POA: Diagnosis not present

## 2023-05-22 NOTE — Progress Notes (Signed)
   THERAPIST PROGRESS NOTE  Session Time: 8-8:38am  Participation Level: Active  Behavioral Response: CasualAlertEuthymic  Type of Therapy: Individual Therapy  Treatment Goals addressed:  Goal: LTG: Sharren "Danielle" will score less than 5 on the Generalized Anxiety Disorder 7 Scale (GAD-7)               Goal: STG: Romilda "Danielle" will practice problem solving skills 3 times per week for the next 4 weeks.     LTG: Recall traumatic events without becoming overwhelmed with negative emotions     ProgressTowards Goals: Progressing  Interventions: CBT, Supportive, and Reframing  Summary:  Cynda Crivello is a 33 y.o. female who presents with symptoms of anxiety and depression.  Patient reports symptoms to include fatigue, irritability, uncontrollable worry, and difficulties sleeping. Pt was oriented times 5. Pt was cooperative and engaged. Pt denies SI/HI/AVH.     Patient utilized therapeutic space to process recent life events related to school.  Patient acknowledges improvement with test anxiety and denies the presence of panic attacks since she has found ways to cope with her test anxiety.  Patient also acknowledges improvements to symptoms could be a result of anxiety medications helping manage symptoms.  Patient identifies coping skills utilized before and during test taking to include deep breathing and reminding herself to positive affirmations.  Patient reflected on recent encounter with colleagues which she labels as "outburst "and "lashing out."  Patient reflected on feelings of lack of control as a result of the stress of her current job.  She expressed excitement about beginning a new job where she feels she will partake in more structure and improved communication.  Clinician educated patient on the CBT triangle and addressed ways in which patient can reframe unhelpful cognitions.  Patient identified negative thoughts to include "I hate it here "and "everyone is lazy."   Worked with clinician to identify controllable factors and established a plan for patient to take a break and walk outside when she notices herself becoming agitated.  Patient identified she can remind herself that she does not need to take on the tasks of everyone on her unit and she can seek out further support for management.  Reflected on patient's growth and acknowledging she has not experienced irritability or "outbursts "while at home with her children.  Suicidal/Homicidal: Nowithout intent/plan  Therapist Response: Cln utilized active and supportive reflection to create a safe environment for patient to process recent life events.  Clinician assessed for current symptoms, stressors, safety since last session.  Educated patient on the CBT triangle and reframing techniques..   Plan: Return again in 4 weeks.  Diagnosis: PTSD (post-traumatic stress disorder)  MDD (major depressive disorder), recurrent episode, mild (HCC)   Collaboration of Care: AEB psychiatrist can access notes and cln. Will review psychiatrists' notes. Check in with the patient and will see LCSW per availability. Patient agreed with treatment recommendations.  Patient/Guardian was advised Release of Information must be obtained prior to any record release in order to collaborate their care with an outside provider. Patient/Guardian was advised if they have not already done so to contact the registration department to sign all necessary forms in order for [place] to release information regarding their care.   Consent: Patient/Guardian gives verbal consent for treatment and assignment of benefits for services provided during this visit. Patient/Guardian expressed understanding and agreed to proceed.   Dereck Leep, LCSW 05/22/2023

## 2023-06-03 ENCOUNTER — Telehealth: Payer: Self-pay | Admitting: Family

## 2023-06-03 NOTE — Telephone Encounter (Signed)
Patient called in to see if Erin [name] could write a note for school stating that she needs her Apple Watch to monitor her heart rate.

## 2023-06-05 ENCOUNTER — Ambulatory Visit: Payer: Medicaid Other | Admitting: Licensed Clinical Social Worker

## 2023-06-07 NOTE — Progress Notes (Unsigned)
No show

## 2023-06-09 ENCOUNTER — Encounter: Payer: Self-pay | Admitting: Family

## 2023-06-09 NOTE — Telephone Encounter (Signed)
 Letter has been written waiting on amanda to sign and will let pt know to come pick it up

## 2023-06-10 ENCOUNTER — Ambulatory Visit (INDEPENDENT_AMBULATORY_CARE_PROVIDER_SITE_OTHER): Payer: Self-pay | Admitting: Psychiatry

## 2023-06-10 DIAGNOSIS — Z91199 Patient's noncompliance with other medical treatment and regimen due to unspecified reason: Secondary | ICD-10-CM

## 2023-06-11 ENCOUNTER — Encounter: Payer: Self-pay | Admitting: Family

## 2023-06-17 ENCOUNTER — Ambulatory Visit (INDEPENDENT_AMBULATORY_CARE_PROVIDER_SITE_OTHER): Payer: Medicaid Other | Admitting: Licensed Clinical Social Worker

## 2023-06-17 DIAGNOSIS — Z91199 Patient's noncompliance with other medical treatment and regimen due to unspecified reason: Secondary | ICD-10-CM

## 2023-06-17 NOTE — Progress Notes (Signed)
Clinician attempted session via face-to-face, but Erin [name] did not appear for her session.

## 2023-06-30 ENCOUNTER — Ambulatory Visit (INDEPENDENT_AMBULATORY_CARE_PROVIDER_SITE_OTHER): Payer: Medicaid Other | Admitting: Licensed Clinical Social Worker

## 2023-06-30 DIAGNOSIS — F330 Major depressive disorder, recurrent, mild: Secondary | ICD-10-CM

## 2023-06-30 DIAGNOSIS — F4310 Post-traumatic stress disorder, unspecified: Secondary | ICD-10-CM
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
## 2023-07-06 ENCOUNTER — Encounter: Payer: Self-pay | Admitting: Family

## 2023-07-06 NOTE — Progress Notes (Signed)
 Established Patient Office Visit  Subjective:  Patient ID: Erin [name], female    DOB: 12-06-1990  Age: 33 y.o. MRN: [id number]  Chief Complaint  Patient presents with   Follow-up    ED Follow up.   Patient went to the ED this past week with further episodes of her palpitations.  She has been feeling better since, says that her anxiety was much worse due to school.   No other concerns today.    No other concerns at this time.   Past Medical History:  Diagnosis Date   Acute cholecystitis 09/03/2021   ADD (attention deficit disorder)    ADHD   Allergic genetic state    Amniotic fluid leaking 08/12/2015   Anxiety    Asthma    Chronic back pain    Depression    postpartum depression in past   Essential hypertension 05/29/2022   Family history of Down syndrome 03/16/2015   Fatigue    GERD (gastroesophageal reflux disease)    Headache    Hyperemesis affecting pregnancy, antepartum 03/10/2015   Nausea and vomiting in pregnancy 07/05/2015   Numbness and tingling    Pelvic pain in female 12/08/2022   Post-operative state 09/08/2015   Seasonal allergies    Seasonal allergies    Seizures (HCC)    during childhood   Viral respiratory illness 03/10/2015    Past Surgical History:  Procedure Laterality Date   CESAREAN SECTION  04/29/2012   CESAREAN SECTION WITH BILATERAL TUBAL LIGATION Bilateral 09/08/2015   Procedure: CESAREAN SECTION WITH BILATERAL TUBAL LIGATION;  Surgeon: Suzy Bouchard, MD;  Location: [facility] ORS;  Service: Obstetrics;  Laterality: Bilateral;   CHOLECYSTECTOMY     ESOPHAGOGASTRODUODENOSCOPY N/A 10/17/2021   Procedure: ESOPHAGOGASTRODUODENOSCOPY (EGD);  Surgeon: Toledo, Boykin Nearing, MD;  Location: [facility] ENDOSCOPY;  Service: Gastroenterology;  Laterality: N/A;   TONSILLECTOMY     TUBAL LIGATION      Social History   Socioeconomic History   Marital status: Single    Spouse name: Not on file   Number of children: 2   Years of  education: Not on file   Highest education level: Some college, no degree  Occupational History   Not on file  Tobacco Use   Smoking status: Former    Current packs/day: 0.10    Types: Cigarettes   Smokeless tobacco: Never  Vaping Use   Vaping status: Never Used  Substance and Sexual Activity   Alcohol use: Yes    Comment: occasional    Drug use: Not Currently    Types: Marijuana    Comment: tussionex cough syrup x 1 week 03-16-15   Sexual activity: Yes    Partners: Male    Birth control/protection: None, Surgical  Other Topics Concern   Not on file  Social History Narrative   Lives at home with her children   Right handed   Caffeine: maybe 3 cups a week   Social Drivers of Corporate investment banker Strain: Low Risk  (07/27/2020)   Received from [facility], [facility]   Overall Financial Resource Strain (CARDIA)    Difficulty of Paying Living Expenses: Not hard at all  Food Insecurity: Food Insecurity Present (07/28/2020)   Received from [facility], [facility]   Hunger Vital Sign    Worried About Running Out of Food in the Last Year: Sometimes true    Ran Out of Food in the Last Year: Sometimes true  Transportation Needs: No Transportation Needs (07/27/2020)   Received from [facility], [facility]   [facility] - Transportation    In the past 12 months, has lack of transportation kept you from medical appointments or from getting medications?: No    Lack of Transportation (Non-Medical): No  Physical Activity: Inactive (07/27/2020)   Received from [facility], [facility]   Exercise Vital Sign    Days of Exercise per Week: 0 days    Minutes of Exercise per Session: 0 min  Stress: No Stress Concern Present (07/27/2020)   Received from [facility], [facility]   [employer] of Occupational  Health - Occupational Stress Questionnaire    Feeling of Stress : Not at all  Social Connections: Unknown (07/27/2020)   Received from [facility], [facility]   Social Connection and Isolation Panel [NHANES]    Frequency of Communication with Friends and Family: More than three times a week    Frequency of Social Gatherings with Friends and Family: Once a week    Attends Religious Services: More than 4 times per year    Active Member of [employer] or Organizations: No    Attends Engineer, structural: Never    Marital Status: Not on file  Intimate Partner Violence: Not on file    Family History  Problem Relation Age of Onset   Anxiety disorder Mother    Depression Mother    Hypertension Mother    Arthritis Mother    Cancer Mother    Diabetes Mother    Migraines Mother    Cancer Father    Depression Maternal Uncle    Suicidality Maternal Uncle    Bipolar disorder Maternal Grandfather    Anxiety disorder Maternal Grandmother    Diabetes Maternal Grandmother     Allergies  Allergen Reactions   Sulfa Antibiotics Hives   Sulfa Drugs Cross Reactors Other (See Comments)    Uncontrollable body shaking.    Review of Systems  Cardiovascular:  Positive for palpitations.  All other systems reviewed and are negative.      Objective:   BP 124/86   Pulse 91   Ht 5\' 8"  (1.727 m)   Wt 253 lb 9.6 oz (115 kg)   LMP 03/27/2023 (Approximate)   SpO2 99%   BMI 38.56 kg/m   Vitals:   04/10/23 1429  BP: 124/86  Pulse: 91  Height: 5\' 8"  (1.727 m)  Weight: 253 lb 9.6 oz (115 kg)  SpO2: 99%  BMI (Calculated): 38.57    Physical Exam Vitals and nursing note reviewed.  Constitutional:      Appearance: Normal appearance. She is normal weight.  HENT:     Head: Normocephalic.  Eyes:     Extraocular Movements: Extraocular movements intact.     Conjunctiva/sclera: Conjunctivae normal.     Pupils: Pupils are equal, round, and reactive to  light.  Cardiovascular:     Rate and Rhythm: Normal rate and regular rhythm.     Pulses: Normal pulses.     Heart sounds: Normal heart sounds.  Pulmonary:     Effort: Pulmonary effort is normal.  Neurological:     General: No focal deficit present.     Mental Status: She is alert and oriented to person, place, and time. Mental status is at baseline.  Psychiatric:        Mood and Affect: Mood normal.  Behavior: Behavior normal.        Thought Content: Thought content normal.        Judgment: Judgment normal.      No results found for any visits on 04/10/23.  No results found for this or any previous visit (from the past 2160 hours).     Assessment & Plan:   Problem List Items Addressed This Visit   None Visit Diagnoses       Palpitations with regular cardiac rhythm    -  Primary     Generalized anxiety disorder         Propranolol RX Sent for patient.  Suspect that the anxiety is to blame for her issues. She will let me know if this is not improved.  Return in about 3 months (around 07/09/2023).   Total time spent: 20 minutes  Miki Kins, FNP  04/10/2023   This document may have been prepared by [facility] Voice Recognition software and as such may include unintentional dictation errors.

## 2023-07-07 ENCOUNTER — Ambulatory Visit (INDEPENDENT_AMBULATORY_CARE_PROVIDER_SITE_OTHER): Admitting: Family

## 2023-07-07 DIAGNOSIS — J069 Acute upper respiratory infection, unspecified: Secondary | ICD-10-CM

## 2023-07-07 DIAGNOSIS — J101 Influenza due to other identified influenza virus with other respiratory manifestations: Secondary | ICD-10-CM

## 2023-07-07 LAB — POCT XPERT XPRESS SARS COVID-2/FLU/RSV
FLU A: POSITIVE
FLU B: NEGATIVE
RSV RNA, PCR: NEGATIVE
SARS Coronavirus 2: NEGATIVE

## 2023-07-07 MED ORDER — OSELTAMIVIR PHOSPHATE 75 MG PO CAPS: 75.0000 mg | ORAL_CAPSULE | Freq: Two times a day (BID) | ORAL | 0 refills | Status: AC

## 2023-07-07 NOTE — Progress Notes (Signed)
   Subjective   CHIEF COMPLAINT  COVID Testing    REASON FOR VISIT  COVID testing for URI symptoms.        Objective   Results for orders placed or performed in visit on 07/07/23  POCT XPERT XPRESS SARS COVID-2/FLU/RSV  Result Value Ref Range   SARS Coronavirus 2 Negative    FLU A Positive    FLU B Negative    RSV RNA, PCR Negative     Assessment & Plan  Problem List Items Addressed This Visit   None Visit Diagnoses       Acute upper respiratory infection    -  Primary   Relevant Medications   oseltamivir (TAMIFLU) 75 MG capsule   Other Relevant Orders   POCT XPERT XPRESS SARS COVID-2/FLU/RSV (Completed)     Influenza A       Relevant Medications   oseltamivir (TAMIFLU) 75 MG capsule      Tamiflu sent to the pharmacy for pt.  Will let me know if she has any further issues.  Total time spent: 5 minutes  Miki Kins, FNP 07/07/2023

## 2023-07-10 ENCOUNTER — Encounter: Payer: Self-pay | Admitting: Family

## 2023-07-16 ENCOUNTER — Ambulatory Visit: Payer: Self-pay | Admitting: Licensed Clinical Social Worker

## 2023-07-29 ENCOUNTER — Ambulatory Visit (INDEPENDENT_AMBULATORY_CARE_PROVIDER_SITE_OTHER): Admitting: Licensed Clinical Social Worker

## 2023-07-29 DIAGNOSIS — F3342 Major depressive disorder, recurrent, in full remission: Secondary | ICD-10-CM

## 2023-07-29 NOTE — Progress Notes (Signed)
 THERAPIST PROGRESS NOTE  Session Time: 8:03-8:33am  Participation Level: Active  Behavioral Response: CasualAlertEuthymic  Type of Therapy: Individual Therapy  Treatment Goals addressed:  Goal: LTG: Erin "Danielle" will score less than 5 on the Generalized Anxiety Disorder 7 Scale (GAD-7)       Dates: Start:  03/12/23    Expected End:  08/10/23        Disciplines: Interdisciplinary, PROVIDER              Outcomes     Date/Time User Outcome    06/30/23 1502 Brileigh Sevcik P Progressing    03/12/23 0857 Jayliana Valencia P Initial              Goal note from Counselor 06/30/2023 by Renee Ramus B, LCSW     06/30/2023: 80% progress Changing life situations to improve anxiety.                                  Goal: STG: Erin "Danielle" will practice problem solving skills 3 times per week for the next 4 weeks.       Dates: Start:  03/12/23    Expected End:  08/10/23        Disciplines: Interdisciplinary, PROVIDER              Outcomes     Date/Time User Outcome    06/30/23 1502 Asa Baudoin P Progressing    03/12/23 0857 Meriem Lemieux P Initial              Goal note from Counselor 06/30/2023 by Renee Ramus B, LCSW     06/30/2023: 80% progress "I've been doing pretty good."                         Intervention: Work with Erin Stanford "Danielle" to track symptoms, triggers, and/or skill use through a mood chart, diary card, or journal     Dates: Start:  03/12/23                         Intervention: Coping Skills      Dates: Start:  03/12/23       Description: Will work with the pt using CBT/DBT techniques to help the pt verbalize an understanding of the cognitive, physiological, and behavioral components of anxiety and its treatment. This will be done by using worksheets, interactive activities, CBT/ABC thought logs, modeling, homework, role playing and journaling. Will work with pt to learn and implement coping skills that result in a reduction of anxiety and improve daily  functioning per pt self report 3 out of 5 documented sessions.                        Template: Depression (OP)                     Problem: OP Depression       Dates: Start:  03/12/23         Disciplines: Interdisciplinary, PROVIDER                    Goal: LTG: Reduce frequency, intensity, and duration of depression symptoms so that daily functioning is improved (Resolved)      Dates: Start:  03/12/23    Expected End:  08/10/23    Resolved:  06/30/23     Disciplines: Interdisciplinary,  PROVIDER              Outcomes     Date/Time User Outcome    06/30/23 1502 Ruffin Frederick Completed/Met    03/12/23 0857 Douglas Rooks P Initial              Goal note from Counselor 06/30/2023 by Dereck Leep, LCSW     06/30/2023-"I feel like I have achieved this goal. I have the energy to go."                               Goal: STG: Erin Stanford "Danielle" will identify cognitive patterns and beliefs that support depression      Dates: Start:  03/12/23    Expected End:  08/10/23        Disciplines: Interdisciplinary, PROVIDER              Outcomes     Date/Time User Outcome    06/30/23 1502 Bettey Muraoka P Progressing    03/12/23 0857 Samay Delcarlo P Initial              Goal note from Counselor 06/30/2023 by Renee Ramus B, LCSW     06/30/2023: 90% complete "My thoughts aren't racing all the time and I feel I have better control over when things get overwhelming and not shut down and lose sight of everything going on."                                Goal: LTG: "I want to feel happier"      Dates: Start:  03/12/23    Expected End:  08/10/23        Disciplines: Interdisciplinary, PROVIDER              Outcomes     Date/Time User Outcome    06/30/23 1502 Kalanie Fewell P Progressing    03/12/23 0857 Subhan Hoopes P Initial              Goal note from Counselor 06/30/2023 by Renee Ramus B, LCSW     06/30/2023: 50% progress "I feel like I do. I still feel its a goal I have to work on, but I have  goals within I'm working on."                        Intervention: Work with Erin Stanford "Danielle" to identify the major components of a recent episode of depression: physical symptoms, major thoughts and images, and major behaviors they experienced     Dates: Start:  03/12/23                    Intervention: Erin Stanford "Danielle" will identify 3 trauma related cognitive distortions     Dates: Start:  03/12/23                    Intervention: Erin Stanford "Danielle" will identify 3 cognitive distortions they are currently using and write reframing statements to replace them     Dates: Start:  03/12/23                                    Problem: Self Esteem:       Dates: Start:  03/12/23         Description:  Disciplines: Counselor                    Goal: STG-Patient will demonstrate improved self esteem by identif      Dates: Start:  03/12/23    Expected End:  08/10/23        Description:       Disciplines: Counselor              Outcomes     Date/Time User Outcome    06/30/23 1502 Koi Zangara P Progressing    03/12/23 0857 Tammey Deeg P Initial              Goal note from Counselor 06/30/2023 by Dereck Leep, LCSW     06/30/2023: "I feel like that is a work in progress. I feel now it is more of an objective outlook."                             Intervention: Coping Skills     Dates: Start:  03/12/23       Description: Will Work with patient to decrease the frequency of negative self-descriptive statements and increase the frequency of positive self- descriptive statements using CBT/DBT/REBT techniques per patient self report 3 out of 5 documented sessions. Some of the techniques that will be used will be CBT, positive affirmations, role playing, modeling, homework and journaling.      Disciplines: Counselor                     Template: PTSD (OP)                   Problem: BH CCP Acute or Chronic Trauma Reaction      Dates: Start:  03/12/23        Disciplines:  Interdisciplinary, PROVIDER                   Goal: LTG: Recall traumatic events without becoming overwhelmed with negative emotions      Dates: Start:  03/12/23    Expected End:  08/10/23        Disciplines: Interdisciplinary, PROVIDER              Outcomes     Date/Time User Outcome    06/30/23 1502 Amarion Portell P Progressing    03/12/23 0857 Sareena Odeh P Initial              Goal note from Counselor 06/30/2023 by Renee Ramus B, LCSW     06/30/2023: 70% progress "Now it's things not triggering it." Patient reports she is not aware of her triggers but would like to learn. Shares progress as she no longer shuts down.                                Goal: STG: Erin Stanford "Danielle" will practice emotion regulation skills 2 time(s) per week for the next 8 week(s)      Dates: Start:  03/12/23    Expected End:  08/10/23        Disciplines: Interdisciplinary, PROVIDER              Outcomes     Date/Time User Outcome    06/30/23 1502 Tomer Chalmers P Progressing    03/12/23 0857 Valincia Touch P Initial  Goal: LTG: "I want to be able to have conversation that don't cause me to cry related to trauma."       Dates: Start:  03/12/23    Expected End:  08/10/23        Disciplines: Interdisciplinary, PROVIDER              Outcomes     Date/Time User Outcome    06/30/23 1502 Clytee Heinrich P Progressing    03/12/23 0857 Ayame Rena P Initial              Goal note from Counselor 06/30/2023 by Renee Ramus B, LCSW     06/30/2023: 70% progress "Now it's things not triggering it."              ProgressTowards Goals: Met  Interventions: Strength-based and Supportive  Summary:  Erin [name] is a 33 y.o. female who presents with symptoms of anxiety and depression.  Patient reports symptoms to include fatigue and difficulties sleeping. Pt was oriented times 5. Pt was cooperative and engaged. Pt denies SI/HI/AVH.     During the session, the patient reflected on her  growth in prioritizing time with her children and engaging in self-care. She mentioned that she has struggled with writing a letter to her younger self, which she believes would help improve her self-esteem. The patient acknowledged feeling disconnected from her younger self due to her current stage of life and mindset.  She also discussed the improvement in her relationships and observed friends who attempt to control her. The patient recognized a pattern of hiding her true self and expressed feelings of being "trapped." However, she reported progress in self-acceptance and is planning to change her current social dynamics while making an effort to meet others who could provide a healthier support system.  Additionally, the patient reflected on her future goals and accepted her graduation from therapeutic services. She noted her efforts to improve her relationship with her father and enhance communication with her mother. The patient stated that she is no longer overextending herself and is being intentional with her personal time.   Key lessons learned from therapy include the realization that "I have more control than I feel I do," as well as the importance of slowing down and avoiding isolation when she feels overwhelmed by her personal situation.  The patient feels she has met all her goals and has determined that this will be her final session. Resources were shared with her, and she was reminded that if she ever feels the need to resume therapy, she can contact [facility] to join the therapist waitlist.  Suicidal/Homicidal: Nowithout intent/plan  Therapist Response: Cln utilized active and supportive reflection to create a safe environment for patient to process recent life events. Clinician assessed for current symptoms, stressors, safety since last session.    Plan: This is patient's final session and resources were shared with the patient should she need to resume services.  Diagnosis: MDD (major  depressive disorder), recurrent, in full remission (HCC)   Collaboration of Care: [facility] psychiatrist can access notes and cln. Will review psychiatrists' notes. Check in with the patient and will see LCSW per availability. Patient agreed with treatment recommendations.   Patient/Guardian was advised Release of Information must be obtained prior to any record release in order to collaborate their care with an outside provider. Patient/Guardian was advised if they have not already done so to contact the registration department to sign all necessary forms in order for [place] to release information  regarding their care.   Consent: Patient/Guardian gives verbal consent for treatment and assignment of benefits for services provided during this visit. Patient/Guardian expressed understanding and agreed to proceed.   Dereck Leep, LCSW 07/29/2023

## 2023-09-01 ENCOUNTER — Ambulatory Visit: Admitting: Licensed Clinical Social Worker

## 2023-10-28 ENCOUNTER — Encounter: Payer: Self-pay | Admitting: Family

## 2023-10-28 ENCOUNTER — Ambulatory Visit: Admitting: Family

## 2023-10-28 VITALS — BP 130/86 | HR 96 | Ht 68.0 in | Wt 271.4 lb

## 2023-10-28 DIAGNOSIS — I10 Essential (primary) hypertension: Secondary | ICD-10-CM

## 2023-10-28 DIAGNOSIS — R7303 Prediabetes: Secondary | ICD-10-CM | POA: Diagnosis not present

## 2023-10-28 DIAGNOSIS — E782 Mixed hyperlipidemia: Secondary | ICD-10-CM

## 2023-10-28 DIAGNOSIS — E559 Vitamin D deficiency, unspecified: Secondary | ICD-10-CM

## 2023-10-28 DIAGNOSIS — E538 Deficiency of other specified B group vitamins: Secondary | ICD-10-CM

## 2023-10-28 DIAGNOSIS — R5383 Other fatigue: Secondary | ICD-10-CM

## 2023-10-28 MED ORDER — ZEPBOUND 5 MG/0.5ML ~~LOC~~ SOAJ: 5.0000 mg | SUBCUTANEOUS | 0 refills | Status: DC

## 2023-10-28 MED ORDER — FLUTICASONE PROPIONATE 50 MCG/ACT NA SUSP
1.0000 | Freq: Every day | NASAL | 2 refills | Status: AC
Start: 2023-10-28 — End: 2024-10-27

## 2023-10-28 NOTE — Assessment & Plan Note (Signed)
 Checking labs today.  Will continue supplements as needed.   - Vitamin D  - Vitamin B12 - TSH

## 2023-10-28 NOTE — Progress Notes (Signed)
 Established Patient Office Visit  Subjective:  Patient ID: Erin [name], female    DOB: [birth year]  Age: 33 y.o. MRN: [id number]  Chief Complaint  Patient presents with   Follow-up    Weight management    Patient doing well overall, but is ready to start working on weight loss.  She asks if there are any options we can try since she was unable to tolerate the wegovy  and the phentermine  was not effective.   She also says that she has been having a feeling of fullness in her right ear, and that she has been hearing wind in it.   Due for labs    No other concerns at this time.   Past Medical History:  Diagnosis Date   Acute cholecystitis 09/03/2021   ADD (attention deficit disorder)    ADHD   Allergic genetic state    Amniotic fluid leaking 08/12/2015   Anxiety    Asthma    Chronic back pain    Depression    postpartum depression in past   Essential hypertension 05/29/2022   Family history of Down syndrome 03/16/2015   Fatigue    GERD (gastroesophageal reflux disease)    Headache    Hyperemesis affecting pregnancy, antepartum 03/10/2015   Nausea and vomiting in pregnancy 07/05/2015   Numbness and tingling    Pelvic pain in female 12/08/2022   Post-operative state 09/08/2015   Seasonal allergies    Seasonal allergies    Seizures (HCC)    during childhood   Viral respiratory illness 03/10/2015    Past Surgical History:  Procedure Laterality Date   CESAREAN SECTION  04/29/2012   CESAREAN SECTION WITH BILATERAL TUBAL LIGATION Bilateral 09/08/2015   Procedure: CESAREAN SECTION WITH BILATERAL TUBAL LIGATION;  Surgeon: Debby JINNY Dinsmore, MD;  Location: [facility] ORS;  Service: Obstetrics;  Laterality: Bilateral;   CHOLECYSTECTOMY     ESOPHAGOGASTRODUODENOSCOPY N/A 10/17/2021   Procedure: ESOPHAGOGASTRODUODENOSCOPY (EGD);  Surgeon: Toledo, Ladell POUR, MD;  Location: [facility] ENDOSCOPY;  Service: Gastroenterology;  Laterality: N/A;   TONSILLECTOMY     TUBAL  LIGATION      Social History   Socioeconomic History   Marital status: Single    Spouse name: Not on file   Number of children: 2   Years of education: Not on file   Highest education level: Some college, no degree  Occupational History   Not on file  Tobacco Use   Smoking status: Former    Current packs/day: 0.10    Types: Cigarettes   Smokeless tobacco: Never  Vaping Use   Vaping status: Never Used  Substance and Sexual Activity   Alcohol use: Yes    Comment: occasional    Drug use: Not Currently    Types: Marijuana    Comment: tussionex cough syrup x 1 week 03-16-15   Sexual activity: Yes    Partners: Male    Birth control/protection: None, Surgical  Other Topics Concern   Not on file  Social History Narrative   Lives at home with her children   Right handed   Caffeine: maybe 3 cups a week   Social Drivers of Corporate investment banker Strain: Low Risk  (07/27/2020)   Received from [facility]   Overall Financial Resource Strain (CARDIA)    Difficulty of Paying Living Expenses: Not hard at all  Food Insecurity: Food Insecurity Present (07/28/2020)   Received from [facility]   Hunger Vital Sign  Within the past 12 months, you worried that your food would run out before you got the money to buy more.: Sometimes true    Within the past 12 months, the food you bought just didn't last and you didn't have money to get more.: Sometimes true  Transportation Needs: No Transportation Needs (07/27/2020)   Received from [facility] - Transportation    In the past 12 months, has lack of transportation kept you from medical appointments or from getting medications?: No    Lack of Transportation (Non-Medical): No  Physical Activity: Inactive (07/27/2020)   Received from [facility]   Exercise Vital Sign    On average, how many days per week do you engage in moderate to strenuous exercise (like  a brisk walk)?: 0 days    On average, how many minutes do you engage in exercise at this level?: 0 min  Stress: No Stress Concern Present (07/27/2020)   Received from [facility] - Occupational Stress Questionnaire    Feeling of Stress : Not at all  Social Connections: Unknown (07/27/2020)   Received from [facility]   Social Connection and Isolation Panel    In a typical week, how many times do you talk on the phone with family, friends, or neighbors?: More than three times a week    How often do you get together with friends or relatives?: Once a week    How often do you attend church or religious services?: More than 4 times per year    Do you belong to any clubs or organizations such as church groups, unions, fraternal or athletic groups, or school groups?: No    How often do you attend meetings of the clubs or organizations you belong to?: Never    Marital Status: Not on file  Intimate Partner Violence: Not on file    Family History  Problem Relation Age of Onset   Anxiety disorder Mother    Depression Mother    Hypertension Mother    Arthritis Mother    Cancer Mother    Diabetes Mother    Migraines Mother    Cancer Father    Depression Maternal Uncle    Suicidality Maternal Uncle    Bipolar disorder Maternal Grandfather    Anxiety disorder Maternal Grandmother    Diabetes Maternal Grandmother     Allergies  Allergen Reactions   Sulfa Antibiotics Hives   Sulfa Drugs Cross Reactors Other (See Comments)    Uncontrollable body shaking.    Review of Systems  HENT:  Positive for ear pain.   All other systems reviewed and are negative.      Objective:   BP 130/86   Pulse 96   Ht 5' 8 (1.727 m)   Wt 271 lb 6.4 oz (123.1 kg)   SpO2 95%   BMI 41.27 kg/m   Vitals:   10/28/23 0901  BP: 130/86  Pulse: 96  Height: 5' 8 (1.727 m)  Weight: 271 lb 6.4 oz (123.1 kg)  SpO2: 95%  BMI  (Calculated): 41.28    Physical Exam Vitals and nursing note reviewed.  Constitutional:      Appearance: Normal appearance. She is normal weight.  HENT:     Head: Normocephalic.   Eyes:     Extraocular Movements: Extraocular movements intact.     Conjunctiva/sclera: Conjunctivae normal.     Pupils: Pupils are equal, round,  and reactive to light.    Cardiovascular:     Rate and Rhythm: Normal rate and regular rhythm.  Pulmonary:     Effort: Pulmonary effort is normal.   Musculoskeletal:     Cervical back: Normal range of motion.   Neurological:     General: No focal deficit present.     Mental Status: She is alert and oriented to person, place, and time. Mental status is at baseline.   Psychiatric:        Mood and Affect: Mood normal.        Behavior: Behavior normal.        Thought Content: Thought content normal.        Judgment: Judgment normal.      No results found for any visits on 10/28/23.  No results found for this or any previous visit (from the past 2160 hours).     Assessment & Plan Prediabetes A1C Continues to be in prediabetic ranges.  Will reassess at follow up after next lab check.  Patient counseled on dietary choices and verbalized understanding.   -CBC w/Diff -CMP w/eGFR -Hemoglobin A1C  Mixed hyperlipidemia Checking labs today.  Continue current therapy for lipid control. Will modify as needed based on labwork results.   -CMP w/eGFR -Lipid Panel  Vitamin D  deficiency, unspecified B12 deficiency due to diet Other fatigue Checking labs today.  Will continue supplements as needed.   - Vitamin D  - Vitamin B12 - TSH  Essential hypertension, benign Blood pressure well controlled with current medications.  Continue current therapy.  Will reassess at follow up.   - CBC w/Diff - CMP w/eGFR    Return in about 1 month (around 11/28/2023).   Total time spent: 20 minutes  ALAN CHRISTELLA ARRANT, FNP  10/28/2023   This document may  have been prepared by [facility] Voice Recognition software and as such may include unintentional dictation errors.

## 2023-10-29 LAB — LIPID PANEL
Chol/HDL Ratio: 3.3 ratio (ref 0.0–4.4)
Cholesterol, Total: 167 mg/dL (ref 100–199)
HDL: 51 mg/dL (ref >39)
LDL Chol Calc (NIH): 93 mg/dL (ref 0–99)
Triglycerides: 131 mg/dL (ref 0–149)
VLDL Cholesterol Cal: 23 mg/dL (ref 5–40)

## 2023-10-29 LAB — CMP14+EGFR
ALT: 14 IU/L (ref 0–32)
AST: 16 IU/L (ref 0–40)
Albumin: 3.9 g/dL (ref 3.9–4.9)
Alkaline Phosphatase: 91 IU/L (ref 44–121)
BUN/Creatinine Ratio: 14 (ref 9–23)
BUN: 11 mg/dL (ref 6–20)
Bilirubin Total: 0.2 mg/dL (ref 0.0–1.2)
CO2: 23 mmol/L (ref 20–29)
Calcium: 8.6 mg/dL — ABNORMAL LOW (ref 8.7–10.2)
Chloride: 102 mmol/L (ref 96–106)
Creatinine, Ser: 0.78 mg/dL (ref 0.57–1.00)
Globulin, Total: 2.8 g/dL (ref 1.5–4.5)
Glucose: 86 mg/dL (ref 70–99)
Potassium: 3.9 mmol/L (ref 3.5–5.2)
Sodium: 136 mmol/L (ref 134–144)
Total Protein: 6.7 g/dL (ref 6.0–8.5)
eGFR: 102 mL/min/{1.73_m2} (ref >59)

## 2023-10-29 LAB — CBC WITH DIFFERENTIAL/PLATELET
Basophils Absolute: 0 10*3/uL (ref 0.0–0.2)
Basos: 0 %
EOS (ABSOLUTE): 0.2 10*3/uL (ref 0.0–0.4)
Eos: 2 %
Hematocrit: 39.5 % (ref 34.0–46.6)
Hemoglobin: 12.8 g/dL (ref 11.1–15.9)
Immature Grans (Abs): 0 10*3/uL (ref 0.0–0.1)
Immature Granulocytes: 0 %
Lymphocytes Absolute: 2.3 10*3/uL (ref 0.7–3.1)
Lymphs: 27 %
MCH: 29.8 pg (ref 26.6–33.0)
MCHC: 32.4 g/dL (ref 31.5–35.7)
MCV: 92 fL (ref 79–97)
Monocytes Absolute: 0.5 10*3/uL (ref 0.1–0.9)
Monocytes: 6 %
Neutrophils Absolute: 5.4 10*3/uL (ref 1.4–7.0)
Neutrophils: 65 %
Platelets: 313 10*3/uL (ref 150–450)
RBC: 4.3 x10E6/uL (ref 3.77–5.28)
RDW: 14.1 % (ref 11.7–15.4)
WBC: 8.4 10*3/uL (ref 3.4–10.8)

## 2023-10-29 LAB — VITAMIN B12: Vitamin B-12: 812 pg/mL (ref 232–1245)

## 2023-10-29 LAB — VITAMIN D 25 HYDROXY (VIT D DEFICIENCY, FRACTURES): Vit D, 25-Hydroxy: 23 ng/mL — ABNORMAL LOW (ref 30.0–100.0)

## 2023-10-29 LAB — HEMOGLOBIN A1C
Est. average glucose Bld gHb Est-mCnc: 100 mg/dL
Hgb A1c MFr Bld: 5.1 % (ref 4.8–5.6)

## 2023-10-29 LAB — TSH: TSH: 0.556 u[IU]/mL (ref 0.450–4.500)

## 2023-10-30 ENCOUNTER — Ambulatory Visit: Payer: Self-pay

## 2023-11-03 ENCOUNTER — Other Ambulatory Visit: Payer: Self-pay | Admitting: Family

## 2023-11-28 ENCOUNTER — Ambulatory Visit: Admitting: Family

## 2024-01-07 ENCOUNTER — Ambulatory Visit (INDEPENDENT_AMBULATORY_CARE_PROVIDER_SITE_OTHER): Admitting: Family

## 2024-01-07 DIAGNOSIS — J069 Acute upper respiratory infection, unspecified: Secondary | ICD-10-CM

## 2024-01-07 LAB — POCT XPERT XPRESS SARS COVID-2/FLU/RSV
FLU A: NEGATIVE
FLU B: NEGATIVE
RSV RNA, PCR: NEGATIVE
SARS Coronavirus 2: NEGATIVE

## 2024-01-07 NOTE — Progress Notes (Signed)
   Subjective   CHIEF COMPLAINT  COVID Testing    REASON FOR VISIT  COVID testing for URI symptoms.        Objective   Results for orders placed or performed in visit on 01/07/24  POCT XPERT XPRESS SARS COVID-2/FLU/RSV  Result Value Ref Range   SARS Coronavirus 2 Negative    FLU A Negative    FLU B Negative    RSV RNA, PCR Negative     Assessment & Plan  1. Acute upper respiratory infection (Primary) - POCT XPERT XPRESS SARS COVID-2/FLU/RSV   Total time spent: 5 minutes  ALAN CHRISTELLA ARRANT, FNP 01/07/2024

## 2024-01-08 NOTE — Procedures (Signed)
 [facility]  Procedure Visit   SUBJECTIVE   Ms. Katiana Ruland is a 33 y.o. female who presented to the pain clinic with Neck Pain and Back Pain (Upper ) . She feels well today.   No new complaints since last visit. Presents for trigger point injection. Risks and benefits of the procedure were discussed. The patient expresses an understanding and elects to proceed.     OBJECTIVE   Vitals:   01/08/24 1350  BP: (!) 120/92  Pulse: 89  Resp: 18  Temp: 36.9 C (98.4 F)  TempSrc: Oral  SpO2: 99%  Weight: (!) 121.6 kg (268 lb)  Height: 172.7 cm (5' 7.99)  PainSc:   6  PainLoc: Neck    Alert, oriented, no acute distress Congruent mood and affect Normocephalic, atraumatic Unlabored respirations, no wheezing Non-distended abdomen Skin overlying without erythema, lesion, or rash   IMPRESSION & PLAN   IMPRESSION: Ms. Johnay Mano is a 33 y.o. female who presents to the pain clinic for evaluation and treatment of Neck Pain and Back Pain (Upper ) .     1. Neck pain     PLAN: We will plan to proceed with the following procedure: trigger point injection  The above plan and management options were discussed at length with Ms. Henrie. The side effects as well as the risks and benefits of the plan were explained. She is in agreement with the above and verbalized understanding.   No follow-ups on file. Future Appointments     Date/Time Provider Department Center Visit Type   01/24/2024 9:00 AM (Arrive by 8:45 AM) CLINIC 2A TREATMENT SCHEDULE [facility] L[facility]    03/08/2024 3:30 PM (Arrive by 3:00 PM) Tanda Junes, NP [facility] RETURN VISIT   03/15/2024 3:00 PM Defoor, Elsie Conch, PA [facility] RETURN VISIT   04/12/2024 8:00 AM Victoria Rosalynn Ambrosia, PA [facility] RETURN 30        PROCEDURE NOTE   Procedure:  Trigger Point Injections  Surgeon: Norleen Sink, MD  Assistant: HERB ISMAEL PACER, MD  Indication:  Myalgia and Trigger Points  Consent:  Risks and alternatives of the procedure were discussed with the patient.  All questions were answered, and the patient was eager to proceed.  Informed consent was signed.  Technique:  The patient's skin was prepped with alcohol.  5 points of maximal tenderness were identified.  At each point, a 25 gauge needle was inserted, and 1 mL of 0.5% ropivacaine was injected following negative aspiration for blood or air.  Twitches were elicited at several of the points.  The patient tolerated the procedure well.  There were no signs of immediate complication.    Muscles injected, bilaterally:   Trapezius , Rhomboids, and Levator scapulae   BRITTANIE DANIELA BAUGHMAN-PANZER

## 2024-01-24 NOTE — Progress Notes (Signed)
 TREATMENT DOCUMENTATION   Treatment: Lidocaine   Date of last consent of lidocaine  : 07/23/2023 IV lidocaine  dosing verified with Truly,RN.        Total Fluids Given: 125 mL Care Summary/ Specialty focused assessment:  Patient arrived to clinic A&Ox4. Treatment plan reviewed. Signs and symptoms of adverse reactions reviewed with patient. Pt placed on cardiac monitor and continuous pulse oximetry. PIV placed by this RN via traditional stick. Pt vital signs/IV site monitored every 20 minutes during infusion and 30 min post.   Patient tolerated infusion well, maintained stable BP, SpO2, HR and RR throughout infusion and 30 minutes observation period. Pt was screened for s/symptoms of toxicity following infusion with no adverse effects observed or reported. IV removed. Patient instructed to go to the ED or UC if any adverse reactions should occur after discharge. AVS declined, uses My Chart. Patient stable at discharge and left clinic via ambulation.    Post Procedure Pain Score: 5/10       Next appointment scheduled on:02/21/24   Therapy delays: none COMPLICATIONS: none   FUNCTIONAL STATUS FALLS RISK: no  USE OF ASSISTIVE DEVICES: no TRANSFER ISSUES: no       IF ISSUES: n/a
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
## 2024-02-09 ENCOUNTER — Ambulatory Visit: Admitting: Psychiatry

## 2024-02-10 ENCOUNTER — Other Ambulatory Visit: Payer: Self-pay | Admitting: Family

## 2024-02-10 MED ORDER — ARIPIPRAZOLE 10 MG PO TABS: 10.0000 mg | ORAL_TABLET | Freq: Every evening | ORAL | 0 refills | Status: DC
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
## 2024-02-18 ENCOUNTER — Other Ambulatory Visit: Payer: Self-pay

## 2024-02-18 ENCOUNTER — Encounter: Payer: Self-pay | Admitting: Family

## 2024-02-18 ENCOUNTER — Ambulatory Visit: Admitting: Family

## 2024-02-18 VITALS — BP 138/94 | HR 91 | Ht 67.0 in | Wt 273.2 lb

## 2024-02-18 DIAGNOSIS — F418 Other specified anxiety disorders: Secondary | ICD-10-CM | POA: Diagnosis not present

## 2024-02-18 DIAGNOSIS — E782 Mixed hyperlipidemia: Secondary | ICD-10-CM | POA: Diagnosis not present

## 2024-02-18 DIAGNOSIS — E538 Deficiency of other specified B group vitamins: Secondary | ICD-10-CM | POA: Insufficient documentation

## 2024-02-18 DIAGNOSIS — E66813 Obesity, class 3: Secondary | ICD-10-CM | POA: Insufficient documentation

## 2024-02-18 DIAGNOSIS — R5383 Other fatigue: Secondary | ICD-10-CM

## 2024-02-18 DIAGNOSIS — E559 Vitamin D deficiency, unspecified: Secondary | ICD-10-CM | POA: Insufficient documentation

## 2024-02-18 DIAGNOSIS — R7303 Prediabetes: Secondary | ICD-10-CM | POA: Insufficient documentation

## 2024-02-18 DIAGNOSIS — I10 Essential (primary) hypertension: Secondary | ICD-10-CM | POA: Insufficient documentation

## 2024-02-18 MED ORDER — VENLAFAXINE HCL ER 75 MG PO CP24: 75.0000 mg | ORAL_CAPSULE | Freq: Every day | ORAL | 1 refills | Status: DC

## 2024-02-18 MED ORDER — TRAZODONE HCL 50 MG PO TABS: 25.0000 mg | ORAL_TABLET | Freq: Every evening | ORAL | 0 refills | Status: DC | PRN

## 2024-02-18 MED ORDER — BUPROPION HCL ER (XL) 150 MG PO TB24: 150.0000 mg | ORAL_TABLET | ORAL | 3 refills | Status: DC

## 2024-02-18 MED ORDER — VENLAFAXINE HCL ER 150 MG PO CP24: 150.0000 mg | ORAL_CAPSULE | Freq: Every day | ORAL | 1 refills | Status: DC

## 2024-02-18 NOTE — Assessment & Plan Note (Addendum)
-   check blood work today. - take medications as prescribed. - reinforced healthy diet and exercise as tolerated.

## 2024-02-18 NOTE — Assessment & Plan Note (Addendum)
-   check blood work. - recommend supplementation based off labs.

## 2024-02-18 NOTE — Progress Notes (Signed)
 Established Patient Office Visit  Subjective:  Patient ID: Erin [name], female    DOB: [birth year]  Age: 33 y.o. MRN: [id number]  Chief Complaint  Patient presents with   Acute Visit    Depression     Patient is here today for her acute visit.  She has been feeling poorly since last appointment.   She does have additional concerns to discuss today. Patient reports worsening depression with her current medications. States she feels down, noticing moody swings,and irritability. She is currently on Effexor  and Trazodone . She reports she ran out of Trazodone . Reports urgent gave patient muscle relaxer for a pulled muscle in her back but could not refill or give her temporary prescription on her Trazodone  so she has been without this medication. Will add back Wellbutrin  150 XL that patient has been on in the past. Will update her PHQ-9 at her next visit. Labs are due today.  She needs refills.   I have reviewed her active problem list, medication list, allergies, family history, social history, health maintenance, notes from last encounter, lab results for her appointment today.      No other concerns at this time.   Past Medical History:  Diagnosis Date   Acute cholecystitis 09/03/2021   ADD (attention deficit disorder)    ADHD   Allergic genetic state    Amniotic fluid leaking 08/12/2015   Anxiety    Asthma    Chronic back pain    Depression    postpartum depression in past   Essential hypertension 05/29/2022   Family history of Down syndrome 03/16/2015   Fatigue    GERD (gastroesophageal reflux disease)    Headache    Hyperemesis affecting pregnancy, antepartum 03/10/2015   Nausea and vomiting in pregnancy 07/05/2015   Numbness and tingling    Pelvic pain in female 12/08/2022   Post-operative state 09/08/2015   Seasonal allergies    Seasonal allergies    Seizures (HCC)    during childhood   Viral respiratory illness 03/10/2015    Past Surgical History:   Procedure Laterality Date   CESAREAN SECTION  04/29/2012   CESAREAN SECTION WITH BILATERAL TUBAL LIGATION Bilateral 09/08/2015   Procedure: CESAREAN SECTION WITH BILATERAL TUBAL LIGATION;  Surgeon: Debby JINNY Dinsmore, MD;  Location: [facility] ORS;  Service: Obstetrics;  Laterality: Bilateral;   CHOLECYSTECTOMY     ESOPHAGOGASTRODUODENOSCOPY N/A 10/17/2021   Procedure: ESOPHAGOGASTRODUODENOSCOPY (EGD);  Surgeon: Toledo, Ladell POUR, MD;  Location: [facility] ENDOS[facility];  Service: Gastroenterology;  Laterality: N/A;   TONSILLECTOMY     TUBAL LIGATION      Social History   Socioeconomic History   Marital status: Single    Spouse name: Not on file   Number of children: 2   Years of education: Not on file   Highest education level: Some college, no degree  Occupational History   Not on file  Tobacco Use   Smoking status: Former    Current packs/day: 0.10    Types: Cigarettes   Smokeless tobacco: Never  Vaping Use   Vaping status: Never Used  Substance and Sexual Activity   Alcohol use: Yes    Comment: occasional    Drug use: Not Currently    Types: Marijuana    Comment: tussionex cough syrup x 1 week 03-16-15   Sexual activity: Yes    Partners: Male    Birth control/protection: None, Surgical  Other Topics Concern   Not on file  Social History Narrative   Lives at home  with her children   Right handed   Caffeine: maybe 3 cups a week   Social Drivers of Corporate investment banker Strain: Low Risk  (07/27/2020)   Received from [facility]   Overall Financial Resource Strain (CARDIA)    Difficulty of Paying Living Expenses: Not hard at all  Food Insecurity: Food Insecurity Present (07/28/2020)   Received from [facility]   Hunger Vital Sign    Within the past 12 months, you worried that your food would run out before you got the money to buy more.: Sometimes true    Within the past 12 months, the food you bought just didn't last and you didn't  have money to get more.: Sometimes true  Transportation Needs: No Transportation Needs (07/27/2020)   Received from [facility] - Transportation    In the past 12 months, has lack of transportation kept you from medical appointments or from getting medications?: No    Lack of Transportation (Non-Medical): No  Physical Activity: Inactive (07/27/2020)   Received from [facility]   Exercise Vital Sign    On average, how many days per week do you engage in moderate to strenuous exercise (like a brisk walk)?: 0 days    On average, how many minutes do you engage in exercise at this level?: 0 min  Stress: No Stress Concern Present (07/27/2020)   Received from [facility] of Occupational Health - Occupational Stress Questionnaire    Feeling of Stress : Not at all  Social Connections: Unknown (07/27/2020)   Received from [facility]   Social Connection and Isolation Panel    In a typical week, how many times do you talk on the phone with family, friends, or neighbors?: More than three times a week    How often do you get together with friends or relatives?: Once a week    How often do you attend church or religious services?: More than 4 times per year    Do you belong to any clubs or organizations such as church groups, unions, fraternal or athletic groups, or school groups?: No    How often do you attend meetings of the clubs or organizations you belong to?: Never    Marital Status: Not on file  Intimate Partner Violence: Not on file    Family History  Problem Relation Age of Onset   Anxiety disorder Mother    Depression Mother    Hypertension Mother    Arthritis Mother    Cancer Mother    Diabetes Mother    Migraines Mother    Cancer Father    Depression Maternal Uncle    Suicidality Maternal Uncle    Bipolar disorder Maternal Grandfather    Anxiety disorder Maternal Grandmother     Diabetes Maternal Grandmother     Allergies  Allergen Reactions   Sulfa Antibiotics Hives   Sulfa Drugs Cross Reactors Other (See Comments)    Uncontrollable body shaking.    Review of Systems  Constitutional:  Positive for malaise/fatigue.  HENT: Negative.    Eyes:  Negative for blurred vision and pain.  Respiratory:  Negative for cough and shortness of breath.   Cardiovascular:  Negative for chest pain, palpitations, claudication and leg swelling.  Gastrointestinal:  Negative for abdominal pain, blood in stool, constipation, diarrhea, nausea and vomiting.  Genitourinary:  Negative for dysuria, frequency and urgency.  Musculoskeletal:  Negative.   Skin: Negative.   Neurological:  Negative for dizziness, tingling, sensory change and headaches.  Endo/Heme/Allergies: Negative.   Psychiatric/Behavioral:  Positive for depression. Negative for suicidal ideas.        Objective:   BP (!) 138/94   Pulse 91   Ht 5' 7 (1.702 m)   Wt 273 lb 3.2 oz (123.9 kg)   LMP 02/04/2024 (Approximate)   SpO2 98%   BMI 42.79 kg/m   Vitals:   02/18/24 1015  BP: (!) 138/94  Pulse: 91  Height: 5' 7 (1.702 m)  Weight: 273 lb 3.2 oz (123.9 kg)  SpO2: 98%  BMI (Calculated): 42.78    Physical Exam Vitals and nursing note reviewed.  Constitutional:      Appearance: Normal appearance.  HENT:     Head: Normocephalic.  Eyes:     Extraocular Movements: Extraocular movements intact.     Pupils: Pupils are equal, round, and reactive to light.  Cardiovascular:     Rate and Rhythm: Normal rate and regular rhythm.     Pulses: Normal pulses.     Heart sounds: Normal heart sounds. No murmur heard. Pulmonary:     Effort: Pulmonary effort is normal. No respiratory distress.     Breath sounds: Normal breath sounds.  Abdominal:     General: There is no distension.     Tenderness: There is no abdominal tenderness.  Musculoskeletal:        General: No tenderness. Normal range of motion.      Cervical back: Normal range of motion and neck supple.     Right lower leg: No edema.     Left lower leg: No edema.  Skin:    General: Skin is warm and dry.     Coloration: Skin is not jaundiced.     Findings: No erythema.  Neurological:     General: No focal deficit present.     Mental Status: She is alert and oriented to person, place, and time.  Psychiatric:        Mood and Affect: Mood is depressed.        Speech: Speech normal.        Behavior: Behavior is cooperative.        Cognition and Memory: Memory is not impaired.     No results found for any visits on 02/18/24.  Recent Results (from the past 2160 hours)  POCT XPERT XPRESS SARS COVID-2/FLU/RSV     Status: None   Collection Time: 01/07/24 12:20 PM  Result Value Ref Range   SARS Coronavirus 2 Negative    FLU A Negative    FLU B Negative    RSV RNA, PCR Negative        Assessment & Plan Prediabetes Mixed hyperlipidemia Essential hypertension, benign Obesity, Class III, BMI 40-49.9 (morbid obesity) (HCC) - check blood work today. - take medications as prescribed. - reinforced healthy diet and exercise as tolerated.  B12 deficiency due to diet Other fatigue Vitamin D  deficiency, unspecified - check blood work. - recommend supplementation based off labs.  Mixed anxiety and depressive disorder - Discussed healthy coping strategies - refills sent - start Wellbutrin  150 mg XL - continue taking other medications as prescribed.    Return in about 2 weeks (around 03/03/2024).   Total time spent: 25 minutes  Oddis DELENA Cain, FNP  02/18/2024   This document may have been prepared by [facility] Voice Recognition software and as such may include unintentional dictation errors.

## 2024-02-18 NOTE — Assessment & Plan Note (Addendum)
-   Discussed healthy coping strategies - refills sent - start Wellbutrin  150 mg XL - continue taking other medications as prescribed.

## 2024-02-19 ENCOUNTER — Ambulatory Visit: Payer: Self-pay

## 2024-02-19 LAB — CMP14+EGFR
ALT: 11 IU/L (ref 0–32)
AST: 9 IU/L (ref 0–40)
Albumin: 4 g/dL (ref 3.9–4.9)
Alkaline Phosphatase: 97 IU/L (ref 41–116)
BUN/Creatinine Ratio: 12 (ref 9–23)
BUN: 9 mg/dL (ref 6–20)
Bilirubin Total: 0.4 mg/dL (ref 0.0–1.2)
CO2: 25 mmol/L (ref 20–29)
Calcium: 9.4 mg/dL (ref 8.7–10.2)
Chloride: 100 mmol/L (ref 96–106)
Creatinine, Ser: 0.77 mg/dL (ref 0.57–1.00)
Globulin, Total: 3.1 g/dL (ref 1.5–4.5)
Glucose: 88 mg/dL (ref 70–99)
Potassium: 4.2 mmol/L (ref 3.5–5.2)
Sodium: 138 mmol/L (ref 134–144)
Total Protein: 7.1 g/dL (ref 6.0–8.5)
eGFR: 104 mL/min/1.73 (ref >59)

## 2024-02-19 LAB — CBC WITH DIFFERENTIAL/PLATELET
Basophils Absolute: 0 x10E3/uL (ref 0.0–0.2)
Basos: 1 %
EOS (ABSOLUTE): 0.1 x10E3/uL (ref 0.0–0.4)
Eos: 2 %
Hematocrit: 41.1 % (ref 34.0–46.6)
Hemoglobin: 13.2 g/dL (ref 11.1–15.9)
Immature Grans (Abs): 0 x10E3/uL (ref 0.0–0.1)
Immature Granulocytes: 0 %
Lymphocytes Absolute: 2.5 x10E3/uL (ref 0.7–3.1)
Lymphs: 32 %
MCH: 29.7 pg (ref 26.6–33.0)
MCHC: 32.1 g/dL (ref 31.5–35.7)
MCV: 92 fL (ref 79–97)
Monocytes Absolute: 0.6 x10E3/uL (ref 0.1–0.9)
Monocytes: 7 %
Neutrophils Absolute: 4.6 x10E3/uL (ref 1.4–7.0)
Neutrophils: 58 %
Platelets: 354 x10E3/uL (ref 150–450)
RBC: 4.45 x10E6/uL (ref 3.77–5.28)
RDW: 14 % (ref 11.7–15.4)
WBC: 7.8 x10E3/uL (ref 3.4–10.8)

## 2024-02-19 LAB — LIPID PANEL
Chol/HDL Ratio: 3.1 ratio (ref 0.0–4.4)
Cholesterol, Total: 152 mg/dL (ref 100–199)
HDL: 49 mg/dL (ref >39)
LDL Chol Calc (NIH): 81 mg/dL (ref 0–99)
Triglycerides: 123 mg/dL (ref 0–149)
VLDL Cholesterol Cal: 22 mg/dL (ref 5–40)

## 2024-02-19 LAB — VITAMIN D 25 HYDROXY (VIT D DEFICIENCY, FRACTURES): Vit D, 25-Hydroxy: 19.8 ng/mL — ABNORMAL LOW (ref 30.0–100.0)

## 2024-02-19 LAB — TSH+T4F+T3FREE
Free T4: 1.19 ng/dL (ref 0.82–1.77)
T3, Free: 3.6 pg/mL (ref 2.0–4.4)
TSH: 0.696 u[IU]/mL (ref 0.450–4.500)

## 2024-02-19 LAB — HEMOGLOBIN A1C
Est. average glucose Bld gHb Est-mCnc: 94 mg/dL
Hgb A1c MFr Bld: 4.9 % (ref 4.8–5.6)

## 2024-02-19 LAB — VITAMIN B12: Vitamin B-12: 764 pg/mL (ref 232–1245)

## 2024-02-19 NOTE — Progress Notes (Signed)
 VM is full

## 2024-02-23 ENCOUNTER — Ambulatory Visit: Admitting: Psychiatry

## 2024-02-24 NOTE — Progress Notes (Signed)
Pt informed

## 2024-02-24 NOTE — Progress Notes (Signed)
 Left VM to return call

## 2024-03-03 ENCOUNTER — Encounter: Payer: Self-pay | Admitting: Family

## 2024-03-03 ENCOUNTER — Ambulatory Visit: Admitting: Family

## 2024-03-03 VITALS — BP 108/68 | HR 99 | Ht 67.0 in | Wt 276.4 lb

## 2024-03-03 DIAGNOSIS — I10 Essential (primary) hypertension: Secondary | ICD-10-CM | POA: Diagnosis not present

## 2024-03-03 DIAGNOSIS — E538 Deficiency of other specified B group vitamins: Secondary | ICD-10-CM

## 2024-03-03 DIAGNOSIS — E66813 Obesity, class 3: Secondary | ICD-10-CM

## 2024-03-03 DIAGNOSIS — F418 Other specified anxiety disorders: Secondary | ICD-10-CM

## 2024-03-03 DIAGNOSIS — E782 Mixed hyperlipidemia: Secondary | ICD-10-CM | POA: Diagnosis not present

## 2024-03-03 DIAGNOSIS — R7303 Prediabetes: Secondary | ICD-10-CM

## 2024-03-03 DIAGNOSIS — Z23 Encounter for immunization: Secondary | ICD-10-CM | POA: Diagnosis not present

## 2024-03-03 DIAGNOSIS — E559 Vitamin D deficiency, unspecified: Secondary | ICD-10-CM

## 2024-03-03 NOTE — Progress Notes (Signed)
 Established Patient Office Visit  Subjective:  Patient ID: Erin [name], female    DOB: 02-01-1991  Age: 33 y.o. MRN: [id number]  Chief Complaint  Patient presents with   Follow-up    2 week follow up    Patient is here today for her 2 week follow up.  She has been feeling fairly well since last appointment.   She does have additional concerns to discuss today. She reports improvement in her depression and anxiety since getting back on her trazodone  and restarting Wellbutrin . She reports sleeping better at night. PhQ-9 sore today 5; GAD-7 score today 3 She would like referral to nutritionist/weight loss clinic to help her with weight loss. Will send referral to weight loss clinic. Labs were done prior to appointment, discussed in detail today. Patient reports starting OTC vitamin D  supplementation daily. Will recheck levels with future labs. She does not need refills at this time.   I have reviewed her active problem list, medication list, allergies, family history, social history, health maintenance, notes from last encounter, lab results for her appointment today.    She would like a flu short today.    No other concerns at this time.   Past Medical History:  Diagnosis Date   Acute cholecystitis 09/03/2021   ADD (attention deficit disorder)    ADHD   Allergic genetic state    Amniotic fluid leaking 08/12/2015   Anxiety    Asthma    Chronic back pain    Depression    postpartum depression in past   Essential hypertension 05/29/2022   Family history of Down syndrome 03/16/2015   Fatigue    GERD (gastroesophageal reflux disease)    Headache    Hyperemesis affecting pregnancy, antepartum 03/10/2015   Nausea and vomiting in pregnancy 07/05/2015   Numbness and tingling    Pelvic pain in female 12/08/2022   Post-operative state 09/08/2015   Seasonal allergies    Seasonal allergies    Seizures (HCC)    during childhood   Viral respiratory illness 03/10/2015     Past Surgical History:  Procedure Laterality Date   CESAREAN SECTION  04/29/2012   CESAREAN SECTION WITH BILATERAL TUBAL LIGATION Bilateral 09/08/2015   Procedure: CESAREAN SECTION WITH BILATERAL TUBAL LIGATION;  Surgeon: Debby JINNY Dinsmore, MD;  Location: [facility] ORS;  Service: Obstetrics;  Laterality: Bilateral;   CHOLECYSTECTOMY     ESOPHAGOGASTRODUODENOSCOPY N/A 10/17/2021   Procedure: ESOPHAGOGASTRODUODENOSCOPY (EGD);  Surgeon: Toledo, Ladell POUR, MD;  Location: [facility] ENDOSCOPY;  Service: Gastroenterology;  Laterality: N/A;   TONSILLECTOMY     TUBAL LIGATION      Social History   Socioeconomic History   Marital status: Single    Spouse name: Not on file   Number of children: 2   Years of education: Not on file   Highest education level: Some college, no degree  Occupational History   Not on file  Tobacco Use   Smoking status: Former    Current packs/day: 0.10    Types: Cigarettes   Smokeless tobacco: Never  Vaping Use   Vaping status: Never Used  Substance and Sexual Activity   Alcohol use: Yes    Comment: occasional    Drug use: Not Currently    Types: Marijuana    Comment: tussionex cough syrup x 1 week 03-16-15   Sexual activity: Yes    Partners: Male    Birth control/protection: None, Surgical  Other Topics Concern   Not on file  Social History Narrative  Lives at home with her children   Right handed   Caffeine: maybe 3 cups a week   Social Drivers of Corporate Investment Banker Strain: Low Risk  (07/27/2020)   Received from [facility]   Overall Financial Resource Strain (CARDIA)    Difficulty of Paying Living Expenses: Not hard at all  Food Insecurity: Food Insecurity Present (07/28/2020)   Received from [facility]   Hunger Vital Sign    Within the past 12 months, you worried that your food would run out before you got the money to buy more.: Sometimes true    Within the past 12 months, the food you bought just  didn't last and you didn't have money to get more.: Sometimes true  Transportation Needs: No Transportation Needs (07/27/2020)   Received from [facility] - Transportation    In the past 12 months, has lack of transportation kept you from medical appointments or from getting medications?: No    Lack of Transportation (Non-Medical): No  Physical Activity: Inactive (07/27/2020)   Received from [facility]   Exercise Vital Sign    On average, how many days per week do you engage in moderate to strenuous exercise (like a brisk walk)?: 0 days    On average, how many minutes do you engage in exercise at this level?: 0 min  Stress: No Stress Concern Present (07/27/2020)   Received from [facility] of Occupational Health - Occupational Stress Questionnaire    Feeling of Stress : Not at all  Social Connections: Unknown (07/27/2020)   Received from [facility]   Social Connection and Isolation Panel    In a typical week, how many times do you talk on the phone with family, friends, or neighbors?: More than three times a week    How often do you get together with friends or relatives?: Once a week    How often do you attend church or religious services?: More than 4 times per year    Do you belong to any clubs or organizations such as church groups, unions, fraternal or athletic groups, or school groups?: No    How often do you attend meetings of the clubs or organizations you belong to?: Never    Marital Status: Not on file  Intimate Partner Violence: Not on file    Family History  Problem Relation Age of Onset   Anxiety disorder Mother    Depression Mother    Hypertension Mother    Arthritis Mother    Cancer Mother    Diabetes Mother    Migraines Mother    Cancer Father    Depression Maternal Uncle    Suicidality Maternal Uncle    Bipolar disorder Maternal Grandfather    Anxiety disorder  Maternal Grandmother    Diabetes Maternal Grandmother     Allergies  Allergen Reactions   Sulfa Antibiotics Hives   Sulfa Drugs Cross Reactors Other (See Comments)    Uncontrollable body shaking.    Review of Systems  Constitutional:  Negative for malaise/fatigue.  HENT: Negative.    Eyes:  Negative for blurred vision and pain.  Respiratory:  Negative for cough and shortness of breath.   Cardiovascular:  Negative for chest pain, palpitations, claudication and leg swelling.  Gastrointestinal:  Negative for abdominal pain, blood in stool, constipation, diarrhea, nausea and vomiting.  Genitourinary:  Negative for dysuria, frequency and  urgency.  Musculoskeletal: Negative.   Skin: Negative.   Neurological:  Negative for dizziness, tingling, sensory change and headaches.  Endo/Heme/Allergies: Negative.   Psychiatric/Behavioral:  Positive for depression. The patient is nervous/anxious.        Objective:   BP 108/68   Pulse 99   Ht 5' 7 (1.702 m)   Wt 276 lb 6.4 oz (125.4 kg)   LMP 02/04/2024 (Approximate)   SpO2 98%   BMI 43.29 kg/m   Vitals:   03/03/24 1005  BP: 108/68  Pulse: 99  Height: 5' 7 (1.702 m)  Weight: 276 lb 6.4 oz (125.4 kg)  SpO2: 98%  BMI (Calculated): 43.28    Physical Exam Vitals and nursing note reviewed.  Constitutional:      Appearance: Normal appearance.  HENT:     Head: Normocephalic.  Eyes:     Extraocular Movements: Extraocular movements intact.     Pupils: Pupils are equal, round, and reactive to light.  Cardiovascular:     Rate and Rhythm: Normal rate and regular rhythm.     Pulses: Normal pulses.     Heart sounds: Normal heart sounds. No murmur heard. Pulmonary:     Effort: Pulmonary effort is normal. No respiratory distress.     Breath sounds: Normal breath sounds.  Abdominal:     General: There is no distension.     Tenderness: There is no abdominal tenderness.  Musculoskeletal:        General: No tenderness. Normal range  of motion.     Cervical back: Normal range of motion and neck supple.     Right lower leg: No edema.     Left lower leg: No edema.  Skin:    General: Skin is warm and dry.     Coloration: Skin is not jaundiced.     Findings: No erythema.  Neurological:     General: No focal deficit present.     Mental Status: She is alert and oriented to person, place, and time.  Psychiatric:        Mood and Affect: Mood normal.        Speech: Speech normal.        Behavior: Behavior is cooperative.        Cognition and Memory: Memory is not impaired.      No results found for any visits on 03/03/24.  Recent Results (from the past 2160 hours)  POCT XPERT XPRESS SARS COVID-2/FLU/RSV     Status: None   Collection Time: 01/07/24 12:20 PM  Result Value Ref Range   SARS Coronavirus 2 Negative    FLU A Negative    FLU B Negative    RSV RNA, PCR Negative   CMP14+EGFR     Status: None   Collection Time: 02/18/24 10:43 AM  Result Value Ref Range   Glucose 88 70 - 99 mg/dL   BUN 9 6 - 20 mg/dL   Creatinine, Ser 9.22 0.57 - 1.00 mg/dL   eGFR 895 >40 fO/fpw/8.26   BUN/Creatinine Ratio 12 9 - 23   Sodium 138 134 - 144 mmol/L   Potassium 4.2 3.5 - 5.2 mmol/L   Chloride 100 96 - 106 mmol/L   CO2 25 20 - 29 mmol/L   Calcium 9.4 8.7 - 10.2 mg/dL   Total Protein 7.1 6.0 - 8.5 g/dL   Albumin 4.0 3.9 - 4.9 g/dL   Globulin, Total 3.1 1.5 - 4.5 g/dL   Bilirubin Total 0.4 0.0 - 1.2 mg/dL  Alkaline Phosphatase 97 41 - 116 IU/L   AST 9 0 - 40 IU/L   ALT 11 0 - 32 IU/L  Lipid panel     Status: None   Collection Time: 02/18/24 10:43 AM  Result Value Ref Range   Cholesterol, Total 152 100 - 199 mg/dL   Triglycerides 876 0 - 149 mg/dL   HDL 49 >60 mg/dL   VLDL Cholesterol Cal 22 5 - 40 mg/dL   LDL Chol Calc (NIH) 81 0 - 99 mg/dL   Chol/HDL Ratio 3.1 0.0 - 4.4 ratio    Comment:                                   T. Chol/HDL Ratio                                             Men  Women                                1/2 Avg.Risk  3.4    3.3                                   Avg.Risk  5.0    4.4                                2X Avg.Risk  9.6    7.1                                3X Avg.Risk 23.4   11.0   VITAMIN D  25 Hydroxy (Vit-D Deficiency, Fractures)     Status: Abnormal   Collection Time: 02/18/24 10:43 AM  Result Value Ref Range   Vit D, 25-Hydroxy 19.8 (L) 30.0 - 100.0 ng/mL    Comment: Vitamin D  deficiency has been defined by the Institute of Medicine and an Endocrine Society practice guideline as a level of serum 25-OH vitamin D  less than 20 ng/mL (1,2). The Endocrine Society went on to further define vitamin D  insufficiency as a level between 21 and 29 ng/mL (2). 1. IOM (Institute of Medicine). 2010. Dietary reference    intakes for calcium and D. [place]: The    [employer]. 2. Holick MF, Binkley [place], Bischoff-Ferrari HA, et al.    Evaluation, treatment, and prevention of vitamin D     deficiency: an Endocrine Society clinical practice    guideline. JCEM. 2011 Jul; 96(7):1911-30.   Vitamin B12     Status: None   Collection Time: 02/18/24 10:43 AM  Result Value Ref Range   Vitamin B-12 764 232 - 1,245 pg/mL  CBC with Diff     Status: None   Collection Time: 02/18/24 10:43 AM  Result Value Ref Range   WBC 7.8 3.4 - 10.8 x10E3/uL   RBC 4.45 3.77 - 5.28 x10E6/uL   Hemoglobin 13.2 11.1 - 15.9 g/dL   Hematocrit 58.8 65.9 - 46.6 %   MCV 92 79 - 97 fL   MCH 29.7 26.6 - 33.0 pg   MCHC 32.1 31.5 -  35.7 g/dL   RDW 85.9 88.2 - 84.5 %   Platelets 354 150 - 450 x10E3/uL   Neutrophils 58 Not Estab. %   Lymphs 32 Not Estab. %   Monocytes 7 Not Estab. %   Eos 2 Not Estab. %   Basos 1 Not Estab. %   Neutrophils Absolute 4.6 1.4 - 7.0 x10E3/uL   Lymphocytes Absolute 2.5 0.7 - 3.1 x10E3/uL   Monocytes Absolute 0.6 0.1 - 0.9 x10E3/uL   EOS (ABSOLUTE) 0.1 0.0 - 0.4 x10E3/uL   Basophils Absolute 0.0 0.0 - 0.2 x10E3/uL   Immature Granulocytes 0 Not Estab. %    Immature Grans (Abs) 0.0 0.0 - 0.1 x10E3/uL  Hemoglobin A1c     Status: None   Collection Time: 02/18/24 10:43 AM  Result Value Ref Range   Hgb A1c MFr Bld 4.9 4.8 - 5.6 %    Comment:          Prediabetes: 5.7 - 6.4          Diabetes: >6.4          Glycemic control for adults with diabetes: <7.0    Est. average glucose Bld gHb Est-mCnc 94 mg/dL  [id number]     Status: None   Collection Time: 02/18/24 10:43 AM  Result Value Ref Range   TSH 0.696 0.450 - 4.500 uIU/mL   T3, Free 3.6 2.0 - 4.4 pg/mL   Free T4 1.19 0.82 - 1.77 ng/dL       Assessment & Plan:   Assessment & Plan Essential hypertension, benign Obesity, Class III, BMI 40-49.9 (morbid obesity) (HCC) Prediabetes Mixed hyperlipidemia - Continue healthy diet and exercise as tolerated. - Continue medications as prescribed. - Check labs today  B12 deficiency due to diet Vitamin D  deficiency, unspecified - Check labs today - Supplementation recommended based off lab results and will notify patient at that time  Mixed anxiety and depressive disorder -- Continue medications as prescribed. FU if symptoms worsen or she has thoughts of suicide ideation, self harm or harm of others. - Discussed healthy coping mechanisms and stress reduction.  Needs flu shot - flu shot given today    Return in about 3 months (around 06/03/2024).   Total time spent: 25 minutes  Oddis DELENA Cain, FNP  03/03/2024   This document may have been prepared by [facility] Voice Recognition software and as such may include unintentional dictation errors.

## 2024-03-03 NOTE — Assessment & Plan Note (Signed)
-   Continue healthy diet and exercise as tolerated. - Continue medications as prescribed. - Check labs today

## 2024-03-03 NOTE — Assessment & Plan Note (Signed)
 -  flu shot given today

## 2024-03-03 NOTE — Assessment & Plan Note (Signed)
-   Continue medications as prescribed. FU if symptoms worsen or she has thoughts of suicide ideation, self harm or harm of others. - Discussed healthy coping mechanisms and stress reduction.

## 2024-03-03 NOTE — Assessment & Plan Note (Signed)
-   Check labs today - Supplementation recommended based off lab results and will notify patient at that time

## 2024-03-31 ENCOUNTER — Ambulatory Visit: Admitting: Family

## 2024-03-31 DIAGNOSIS — J069 Acute upper respiratory infection, unspecified: Secondary | ICD-10-CM

## 2024-03-31 LAB — POCT XPERT XPRESS SARS COVID-2/FLU/RSV
FLU A: NEGATIVE
FLU B: NEGATIVE
RSV RNA, PCR: NEGATIVE
SARS Coronavirus 2: NEGATIVE

## 2024-04-02 ENCOUNTER — Ambulatory Visit: Payer: Self-pay

## 2024-04-04 NOTE — Progress Notes (Signed)
   Subjective   CHIEF COMPLAINT  COVID Testing    REASON FOR VISIT  COVID testing for URI symptoms.        Objective   Results for orders placed or performed in visit on 03/31/24  POCT XPERT XPRESS SARS COVID-2/FLU/RSV  Result Value Ref Range   SARS Coronavirus 2 Negative    FLU A Negative    FLU B Negative    RSV RNA, PCR Negative     Assessment & Plan  1. Acute upper respiratory infection (Primary)  - POCT XPERT XPRESS SARS COVID-2/FLU/RSV   Total time spent: 5 minutes  ALAN CHRISTELLA ARRANT, FNP 03/31/2024

## 2024-05-13 ENCOUNTER — Other Ambulatory Visit: Payer: Self-pay | Admitting: Family

## 2024-05-21 ENCOUNTER — Other Ambulatory Visit: Payer: Self-pay | Admitting: Family

## 2024-05-21 MED ORDER — VENLAFAXINE HCL ER 75 MG PO CP24: ORAL_CAPSULE | ORAL | 1 refills | Status: AC

## 2024-05-26 ENCOUNTER — Ambulatory Visit: Admitting: Family

## 2024-05-26 ENCOUNTER — Encounter: Payer: Self-pay | Admitting: Family

## 2024-05-26 VITALS — BP 132/98 | HR 99 | Ht 69.0 in | Wt 280.2 lb

## 2024-05-26 DIAGNOSIS — E538 Deficiency of other specified B group vitamins: Secondary | ICD-10-CM | POA: Diagnosis not present

## 2024-05-26 DIAGNOSIS — N926 Irregular menstruation, unspecified: Secondary | ICD-10-CM | POA: Diagnosis not present

## 2024-05-26 DIAGNOSIS — Z6841 Body Mass Index (BMI) 40.0 and over, adult: Secondary | ICD-10-CM | POA: Diagnosis not present

## 2024-05-26 DIAGNOSIS — M797 Fibromyalgia: Secondary | ICD-10-CM

## 2024-05-26 DIAGNOSIS — R5383 Other fatigue: Secondary | ICD-10-CM

## 2024-05-26 DIAGNOSIS — E782 Mixed hyperlipidemia: Secondary | ICD-10-CM | POA: Diagnosis not present

## 2024-05-26 DIAGNOSIS — F411 Generalized anxiety disorder: Secondary | ICD-10-CM | POA: Insufficient documentation

## 2024-05-26 DIAGNOSIS — I10 Essential (primary) hypertension: Secondary | ICD-10-CM | POA: Diagnosis not present

## 2024-05-26 DIAGNOSIS — R7303 Prediabetes: Secondary | ICD-10-CM

## 2024-05-26 DIAGNOSIS — F330 Major depressive disorder, recurrent, mild: Secondary | ICD-10-CM

## 2024-05-26 DIAGNOSIS — E559 Vitamin D deficiency, unspecified: Secondary | ICD-10-CM

## 2024-05-26 MED ORDER — ARIPIPRAZOLE 10 MG PO TABS: 10.0000 mg | ORAL_TABLET | Freq: Every evening | ORAL | 0 refills | Status: AC

## 2024-05-26 MED ORDER — IBUPROFEN 800 MG PO TABS: 800.0000 mg | ORAL_TABLET | Freq: Three times a day (TID) | ORAL | 1 refills | Status: AC | PRN

## 2024-05-26 NOTE — Assessment & Plan Note (Signed)
-   Continue healthy diet and exercise as tolerated. - Continue medications as prescribed. - Check labs today - Discussed starting Zepbound  if Hcg is negative and test in 1 week is negative.

## 2024-05-26 NOTE — Progress Notes (Signed)
 "  Established Patient Office Visit  Subjective:  Patient ID: Erin [name], female    DOB: 07/10/1990  Age: 34 y.o. MRN: [id number]  Chief Complaint  Patient presents with   Follow-up    Missed her January period, breast pains. Took 2 pregnancy tests and came back negative.     Patient is here today for her 3 months follow up.  She has been feeling fairly well since last appointment.   She does have additional concerns to discuss today. She reports last menstrual period was 04/01/24. She has taken 2 negative at home tests since it has been a month since her last cycle. She reports breast are tender. Patient had bilateral tubal 08/2015. Discussed doing HCG serum with routine blood work today and OBGYN referral as needed. Patient reports at this time still taking her medications as prescribed. She is interested in getting back on Zepbound  if she is not pregnant.  Labs are due today.  She needs refills.   I have reviewed her active problem list, medication list, allergies, family history, social history, health maintenance, notes from last encounter, lab results for her appointment today.      No other concerns at this time.   Past Medical History:  Diagnosis Date   Acute cholecystitis 09/03/2021   ADD (attention deficit disorder)    ADHD   Allergic genetic state    Amniotic fluid leaking 08/12/2015   Anxiety    Asthma    Chronic back pain    Depression    postpartum depression in past   Essential hypertension 05/29/2022   Family history of Down syndrome 03/16/2015   Fatigue    GERD (gastroesophageal reflux disease)    Headache    Hyperemesis affecting pregnancy, antepartum 03/10/2015   Nausea and vomiting in pregnancy 07/05/2015   Numbness and tingling    Pelvic pain in female 12/08/2022   Post-operative state 09/08/2015   Seasonal allergies    Seasonal allergies    Seizures (HCC)    during childhood   Viral respiratory illness 03/10/2015    Past Surgical  History:  Procedure Laterality Date   CESAREAN SECTION  04/29/2012   CESAREAN SECTION WITH BILATERAL TUBAL LIGATION Bilateral 09/08/2015   Procedure: CESAREAN SECTION WITH BILATERAL TUBAL LIGATION;  Surgeon: Debby JINNY Dinsmore, MD;  Location: [facility];  Service: Obstetrics;  Laterality: Bilateral;   CHOLECYSTECTOMY     ESOPHAGOGASTRODUODENOSCOPY N/A 10/17/2021   Procedure: ESOPHAGOGASTRODUODENOSCOPY (EGD);  Surgeon: Toledo, Ladell POUR, MD;  Location: [facility];  Service: Gastroenterology;  Laterality: N/A;   TONSILLECTOMY     TUBAL LIGATION      Social History   Socioeconomic History   Marital status: Single    Spouse name: Not on file   Number of children: 2   Years of education: Not on file   Highest education level: Some college, no degree  Occupational History   Not on file  Tobacco Use   Smoking status: Former    Current packs/day: 0.10    Types: Cigarettes   Smokeless tobacco: Never  Vaping Use   Vaping status: Never Used  Substance and Sexual Activity   Alcohol use: Yes    Comment: occasional    Drug use: Not Currently    Types: Marijuana    Comment: tussionex cough syrup x 1 week 03-16-15   Sexual activity: Yes    Partners: Male    Birth control/protection: None, Surgical  Other Topics Concern   Not on file  Social History  Narrative   Lives at home with her children   Right handed   Caffeine: maybe 3 cups a week   Social Drivers of Health   Tobacco Use: Medium Risk (05/26/2024)   Patient History    Smoking Tobacco Use: Former    Smokeless Tobacco Use: Never    Passive Exposure: Not on Actuary Strain: Not on file  Food Insecurity: Not on file  Transportation Needs: Not on file  Physical Activity: Not on file  Stress: Not on file  Social Connections: Not on file  Intimate Partner Violence: Not on file  Depression (PHQ2-9): Low Risk (03/03/2024)   Depression (PHQ2-9)    PHQ-2 Score: 4  Alcohol Screen: Not on file  Housing:  Unknown (05/14/2023)   Received from [facility]   Epic    Unable to Pay for Housing in the Last Year: Not on file    Number of Times Moved in the Last Year: Not on file    At any time in the past 12 months, were you homeless or living in a shelter (including now)?: No  Utilities: Not on file  Health Literacy: Not on file    Family History  Problem Relation Age of Onset   Anxiety disorder Mother    Depression Mother    Hypertension Mother    Arthritis Mother    Cancer Mother    Diabetes Mother    Migraines Mother    Cancer Father    Depression Maternal Uncle    Suicidality Maternal Uncle    Bipolar disorder Maternal Grandfather    Anxiety disorder Maternal Grandmother    Diabetes Maternal Grandmother     Allergies[1]  Review of Systems  Constitutional:  Positive for malaise/fatigue.  HENT: Negative.    Eyes:  Negative for blurred vision and pain.  Respiratory:  Negative for cough and shortness of breath.   Cardiovascular:  Negative for chest pain, palpitations, claudication and leg swelling.  Gastrointestinal:  Negative for abdominal pain, blood in stool, constipation, diarrhea, nausea and vomiting.  Genitourinary:  Negative for dysuria, frequency and urgency.  Musculoskeletal:        Breast tenderness; missed period  Skin: Negative.   Neurological:  Negative for dizziness, tingling, sensory change and headaches.  Endo/Heme/Allergies: Negative.   Psychiatric/Behavioral: Negative.         Objective:   BP (!) 132/98   Pulse 99   Ht 5' 9 (1.753 m)   Wt 280 lb 3.2 oz (127.1 kg)   LMP 04/01/2024 (Approximate)   SpO2 98%   BMI 41.38 kg/m   Vitals:   05/26/24 0914  BP: (!) 132/98  Pulse: 99  Height: 5' 9 (1.753 m)  Weight: 280 lb 3.2 oz (127.1 kg)  SpO2: 98%  BMI (Calculated): 41.36    Physical Exam Vitals and nursing note reviewed.  Constitutional:      Appearance: Normal appearance.  HENT:     Head: Normocephalic.  Eyes:      Extraocular Movements: Extraocular movements intact.     Pupils: Pupils are equal, round, and reactive to light.  Cardiovascular:     Rate and Rhythm: Normal rate and regular rhythm.     Pulses: Normal pulses.     Heart sounds: Normal heart sounds. No murmur heard. Pulmonary:     Effort: Pulmonary effort is normal. No respiratory distress.     Breath sounds: Normal breath sounds.  Abdominal:     General: There is no distension.  Tenderness: There is no abdominal tenderness.  Musculoskeletal:        General: No tenderness. Normal range of motion.     Cervical back: Normal range of motion and neck supple.     Right lower leg: No edema.     Left lower leg: No edema.  Skin:    General: Skin is warm and dry.     Coloration: Skin is not jaundiced.     Findings: No erythema.  Neurological:     General: No focal deficit present.     Mental Status: She is alert and oriented to person, place, and time.  Psychiatric:        Mood and Affect: Mood normal.        Speech: Speech normal.        Behavior: Behavior is cooperative.        Cognition and Memory: Memory is not impaired.      No results found for any visits on 05/26/24.  Recent Results (from the past 2160 hours)  POCT XPERT XPRESS SARS COVID-2/FLU/RSV     Status: None   Collection Time: 03/31/24  2:07 PM  Result Value Ref Range   SARS Coronavirus 2 Negative    FLU A Negative    FLU B Negative    RSV RNA, PCR Negative        Assessment & Plan:   Assessment & Plan Vitamin D  deficiency, unspecified B12 deficiency due to diet Other fatigue - Check labs today - Supplementation recommended based off lab results and will notify patient at that time  Essential hypertension, benign Prediabetes Mixed hyperlipidemia Morbid obesity with body mass index (BMI) of 40.0 to 44.9 in adult [facility]) - Continue healthy diet and exercise as tolerated. - Continue medications as prescribed. - Check labs today - Discussed starting  Zepbound  if Hcg is negative and test in 1 week is negative. Mild episode of recurrent major depressive disorder GAD (generalized anxiety disorder) - refill sent. - Continue medications as prescribed. FU if symptoms worsen or she has thoughts of suicide ideation, self harm or harm of others. - Discussed healthy coping mechanisms and stress reduction.  Missed period - Check serum Hcg. If negative retest in 1 week. Fibromyalgia - Refill sent.    Return in about 5 weeks (around 06/30/2024).   Total time spent: 30 minutes  Oddis DELENA Cain, FNP  05/26/2024   This document may have been prepared by [facility] Voice Recognition software and as such may include unintentional dictation errors.     [1]  Allergies Allergen Reactions   Sulfa Antibiotics Hives   Sulfa Drugs Cross Reactors Other (See Comments)    Uncontrollable body shaking.   "

## 2024-05-26 NOTE — Assessment & Plan Note (Signed)
-   Check labs today - Supplementation recommended based off lab results and will notify patient at that time

## 2024-05-26 NOTE — Assessment & Plan Note (Signed)
-   refill sent. - Continue medications as prescribed. FU if symptoms worsen or she has thoughts of suicide ideation, self harm or harm of others. - Discussed healthy coping mechanisms and stress reduction.

## 2024-05-26 NOTE — Assessment & Plan Note (Signed)
-   Check serum Hcg. If negative retest in 1 week.

## 2024-05-26 NOTE — Assessment & Plan Note (Signed)
 Refill sent

## 2024-05-27 ENCOUNTER — Ambulatory Visit: Payer: Self-pay

## 2024-05-27 DIAGNOSIS — E559 Vitamin D deficiency, unspecified: Secondary | ICD-10-CM

## 2024-05-27 LAB — CBC WITH DIFFERENTIAL/PLATELET
Basophils Absolute: 0 10*3/uL (ref 0.0–0.2)
Basos: 0 %
EOS (ABSOLUTE): 0.2 10*3/uL (ref 0.0–0.4)
Eos: 2 %
Hematocrit: 42.5 % (ref 34.0–46.6)
Hemoglobin: 13.7 g/dL (ref 11.1–15.9)
Immature Grans (Abs): 0 10*3/uL (ref 0.0–0.1)
Immature Granulocytes: 0 %
Lymphocytes Absolute: 2.6 10*3/uL (ref 0.7–3.1)
Lymphs: 27 %
MCH: 29.3 pg (ref 26.6–33.0)
MCHC: 32.2 g/dL (ref 31.5–35.7)
MCV: 91 fL (ref 79–97)
Monocytes Absolute: 0.5 10*3/uL (ref 0.1–0.9)
Monocytes: 5 %
Neutrophils Absolute: 6.4 10*3/uL (ref 1.4–7.0)
Neutrophils: 66 %
Platelets: 390 10*3/uL (ref 150–450)
RBC: 4.68 x10E6/uL (ref 3.77–5.28)
RDW: 14.3 % (ref 11.7–15.4)
WBC: 9.9 10*3/uL (ref 3.4–10.8)

## 2024-05-27 LAB — CMP14+EGFR
ALT: 20 [IU]/L (ref 0–32)
AST: 17 [IU]/L (ref 0–40)
Albumin: 3.9 g/dL (ref 3.9–4.9)
Alkaline Phosphatase: 109 [IU]/L (ref 41–116)
BUN/Creatinine Ratio: 11 (ref 9–23)
BUN: 9 mg/dL (ref 6–20)
Bilirubin Total: 0.3 mg/dL (ref 0.0–1.2)
CO2: 25 mmol/L (ref 20–29)
Calcium: 8.8 mg/dL (ref 8.7–10.2)
Chloride: 98 mmol/L (ref 96–106)
Creatinine, Ser: 0.8 mg/dL (ref 0.57–1.00)
Globulin, Total: 3.2 g/dL (ref 1.5–4.5)
Glucose: 108 mg/dL — ABNORMAL HIGH (ref 70–99)
Potassium: 4.3 mmol/L (ref 3.5–5.2)
Sodium: 138 mmol/L (ref 134–144)
Total Protein: 7.1 g/dL (ref 6.0–8.5)
eGFR: 99 mL/min/{1.73_m2}

## 2024-05-27 LAB — LIPID PANEL
Chol/HDL Ratio: 3.1 ratio (ref 0.0–4.4)
Cholesterol, Total: 191 mg/dL (ref 100–199)
HDL: 61 mg/dL
LDL Chol Calc (NIH): 108 mg/dL — ABNORMAL HIGH (ref 0–99)
Triglycerides: 126 mg/dL (ref 0–149)
VLDL Cholesterol Cal: 22 mg/dL (ref 5–40)

## 2024-05-27 LAB — HCG, SERUM, QUALITATIVE: hCG,Beta Subunit,Qual,Serum: NEGATIVE m[IU]/mL

## 2024-05-27 LAB — TSH+T4F+T3FREE
Free T4: 0.85 ng/dL (ref 0.82–1.77)
T3, Free: 3.1 pg/mL (ref 2.0–4.4)
TSH: 1.21 u[IU]/mL (ref 0.450–4.500)

## 2024-05-27 LAB — HEMOGLOBIN A1C
Est. average glucose Bld gHb Est-mCnc: 103 mg/dL
Hgb A1c MFr Bld: 5.2 % (ref 4.8–5.6)

## 2024-05-27 LAB — VITAMIN B12: Vitamin B-12: 720 pg/mL (ref 232–1245)

## 2024-05-27 LAB — VITAMIN D 25 HYDROXY (VIT D DEFICIENCY, FRACTURES): Vit D, 25-Hydroxy: 16.2 ng/mL — ABNORMAL LOW (ref 30.0–100.0)

## 2024-05-27 MED ORDER — ZEPBOUND 2.5 MG/0.5ML ~~LOC~~ SOAJ: 2.5000 mg | SUBCUTANEOUS | 0 refills | Status: AC

## 2024-05-27 MED ORDER — VITAMIN D (ERGOCALCIFEROL) 1.25 MG (50000 UNIT) PO CAPS: 50000.0000 [IU] | ORAL_CAPSULE | ORAL | 1 refills | Status: AC

## 2024-05-28 NOTE — Progress Notes (Signed)
 Patient notified

## 2024-06-03 ENCOUNTER — Ambulatory Visit: Admitting: Family

## 2024-06-30 ENCOUNTER — Ambulatory Visit: Admitting: Family
